# Patient Record
Sex: Female | Born: 1970 | Race: Black or African American | Hispanic: No | Marital: Single | Smoking: Never smoker
Health system: Southern US, Community
[De-identification: ages and names within clinical notes are randomized; demographics above are authoritative.]

## PROBLEM LIST (undated history)

## (undated) DIAGNOSIS — Z87898 Personal history of other specified conditions: Secondary | ICD-10-CM

## (undated) DIAGNOSIS — E785 Hyperlipidemia, unspecified: Secondary | ICD-10-CM

## (undated) DIAGNOSIS — M1990 Unspecified osteoarthritis, unspecified site: Secondary | ICD-10-CM

## (undated) DIAGNOSIS — K219 Gastro-esophageal reflux disease without esophagitis: Secondary | ICD-10-CM

## (undated) DIAGNOSIS — M2010 Hallux valgus (acquired), unspecified foot: Secondary | ICD-10-CM

## (undated) DIAGNOSIS — R7303 Prediabetes: Secondary | ICD-10-CM

## (undated) DIAGNOSIS — J45909 Unspecified asthma, uncomplicated: Secondary | ICD-10-CM

## (undated) DIAGNOSIS — T7840XA Allergy, unspecified, initial encounter: Secondary | ICD-10-CM

## (undated) HISTORY — DX: Personal history of other specified conditions: Z87.898

## (undated) HISTORY — DX: Prediabetes: R73.03

## (undated) HISTORY — DX: Hallux valgus (acquired), unspecified foot: M20.10

## (undated) HISTORY — DX: Unspecified osteoarthritis, unspecified site: M19.90

## (undated) HISTORY — DX: Allergy, unspecified, initial encounter: T78.40XA

## (undated) HISTORY — DX: Unspecified asthma, uncomplicated: J45.909

## (undated) HISTORY — DX: Gastro-esophageal reflux disease without esophagitis: K21.9

## (undated) HISTORY — DX: Hyperlipidemia, unspecified: E78.5

## (undated) HISTORY — PX: BUNIONECTOMY: SHX129

---

## 1999-05-22 ENCOUNTER — Emergency Department (HOSPITAL_COMMUNITY): Admission: EM | Admit: 1999-05-22 | Discharge: 1999-05-22 | Payer: Self-pay | Admitting: Emergency Medicine

## 1999-05-22 ENCOUNTER — Encounter: Payer: Self-pay | Admitting: Emergency Medicine

## 2005-05-13 ENCOUNTER — Ambulatory Visit (HOSPITAL_COMMUNITY): Admission: RE | Admit: 2005-05-13 | Discharge: 2005-05-13 | Payer: Self-pay | Admitting: Gastroenterology

## 2005-05-25 ENCOUNTER — Ambulatory Visit (HOSPITAL_COMMUNITY): Admission: RE | Admit: 2005-05-25 | Discharge: 2005-05-25 | Payer: Self-pay | Admitting: Gastroenterology

## 2006-08-09 ENCOUNTER — Emergency Department (HOSPITAL_COMMUNITY): Admission: EM | Admit: 2006-08-09 | Discharge: 2006-08-09 | Payer: Self-pay | Admitting: Emergency Medicine

## 2006-09-13 ENCOUNTER — Emergency Department (HOSPITAL_COMMUNITY): Admission: EM | Admit: 2006-09-13 | Discharge: 2006-09-13 | Payer: Self-pay | Admitting: Emergency Medicine

## 2006-09-26 ENCOUNTER — Encounter: Admission: RE | Admit: 2006-09-26 | Discharge: 2006-09-26 | Payer: Self-pay | Admitting: Gastroenterology

## 2007-07-12 ENCOUNTER — Encounter: Admission: RE | Admit: 2007-07-12 | Discharge: 2007-07-12 | Payer: Self-pay | Admitting: Internal Medicine

## 2007-12-03 IMAGING — CR DG CHEST 2V
2 series · 2 of 2 positions shown · non-contrast
Comparison: [DATE].

CLINICAL DATA: Left sided chest pain for a week.  Short of breath.  Cough.  History of asthma.
 CHEST - 2 VIEW:

[w chest pa]
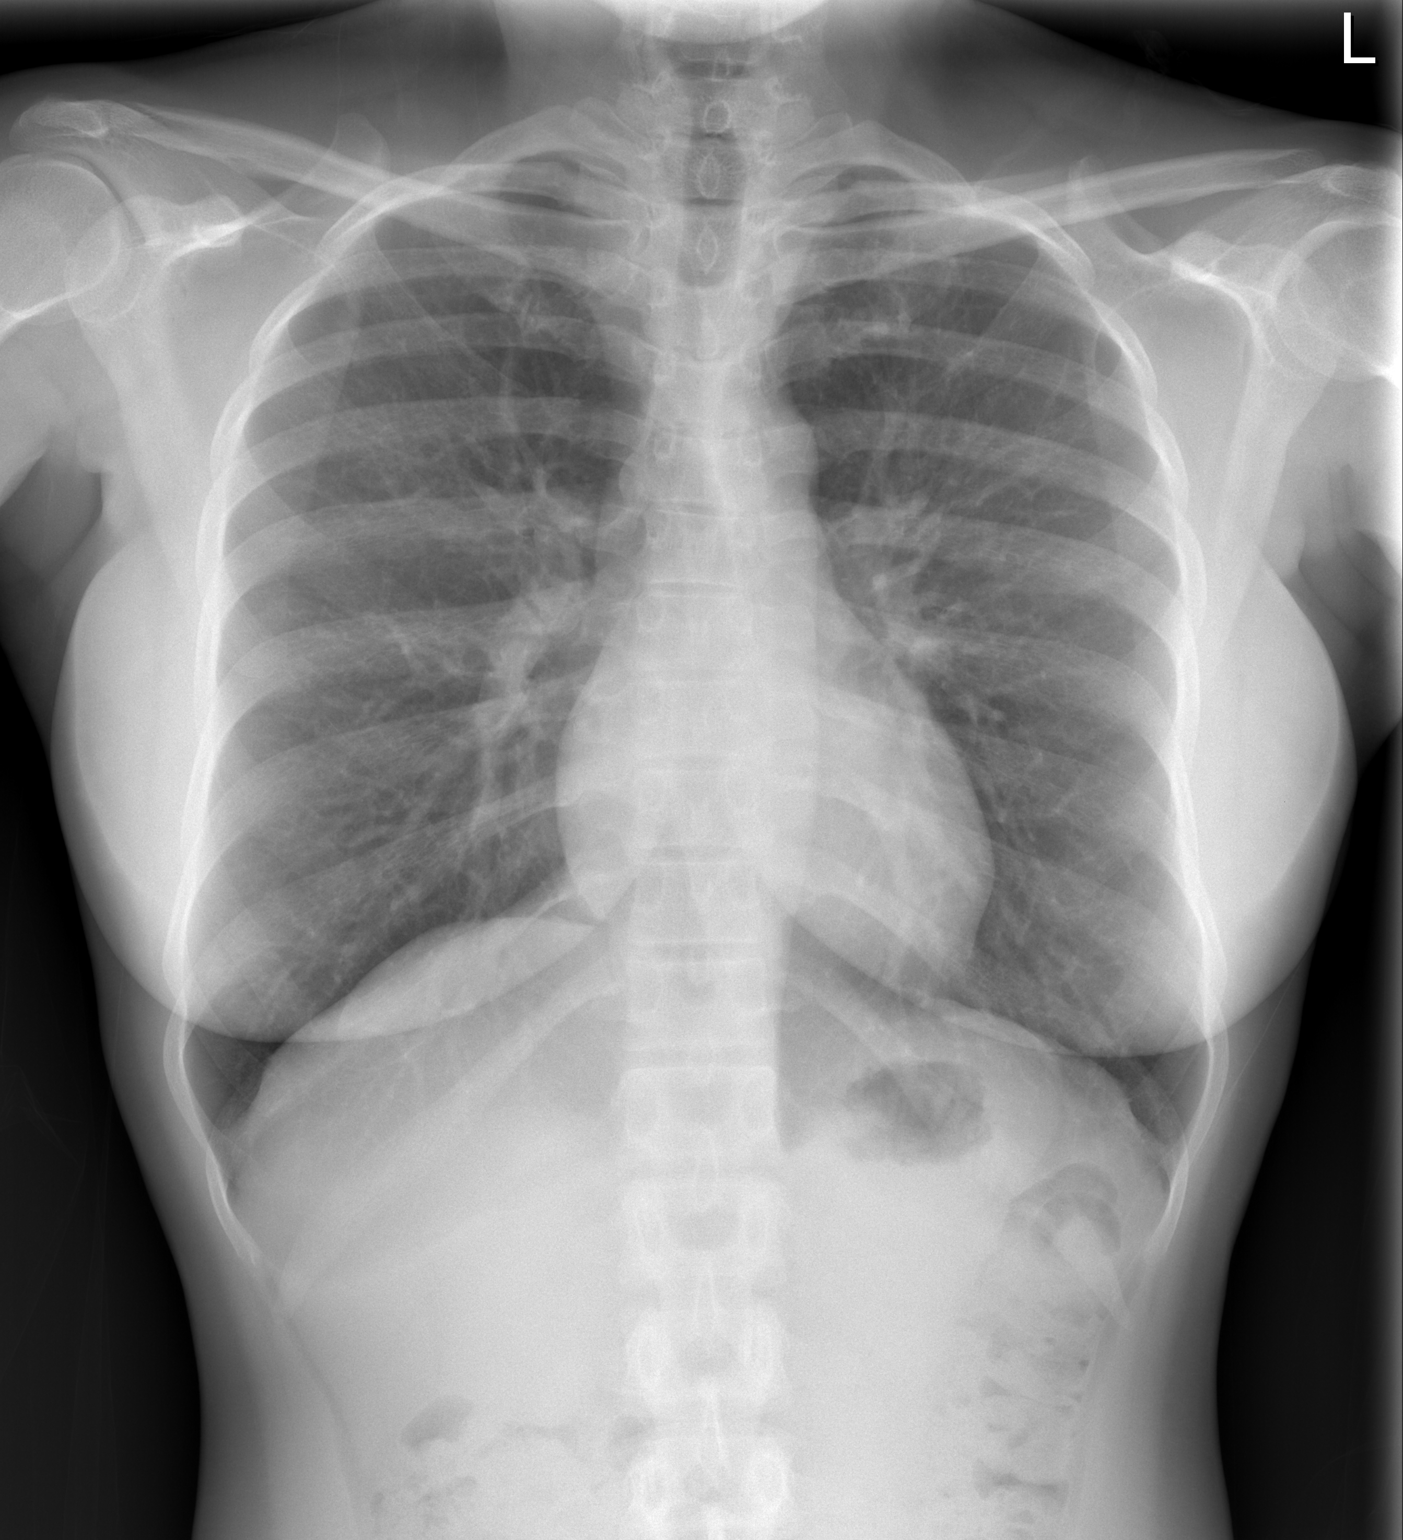

[w chest lat]
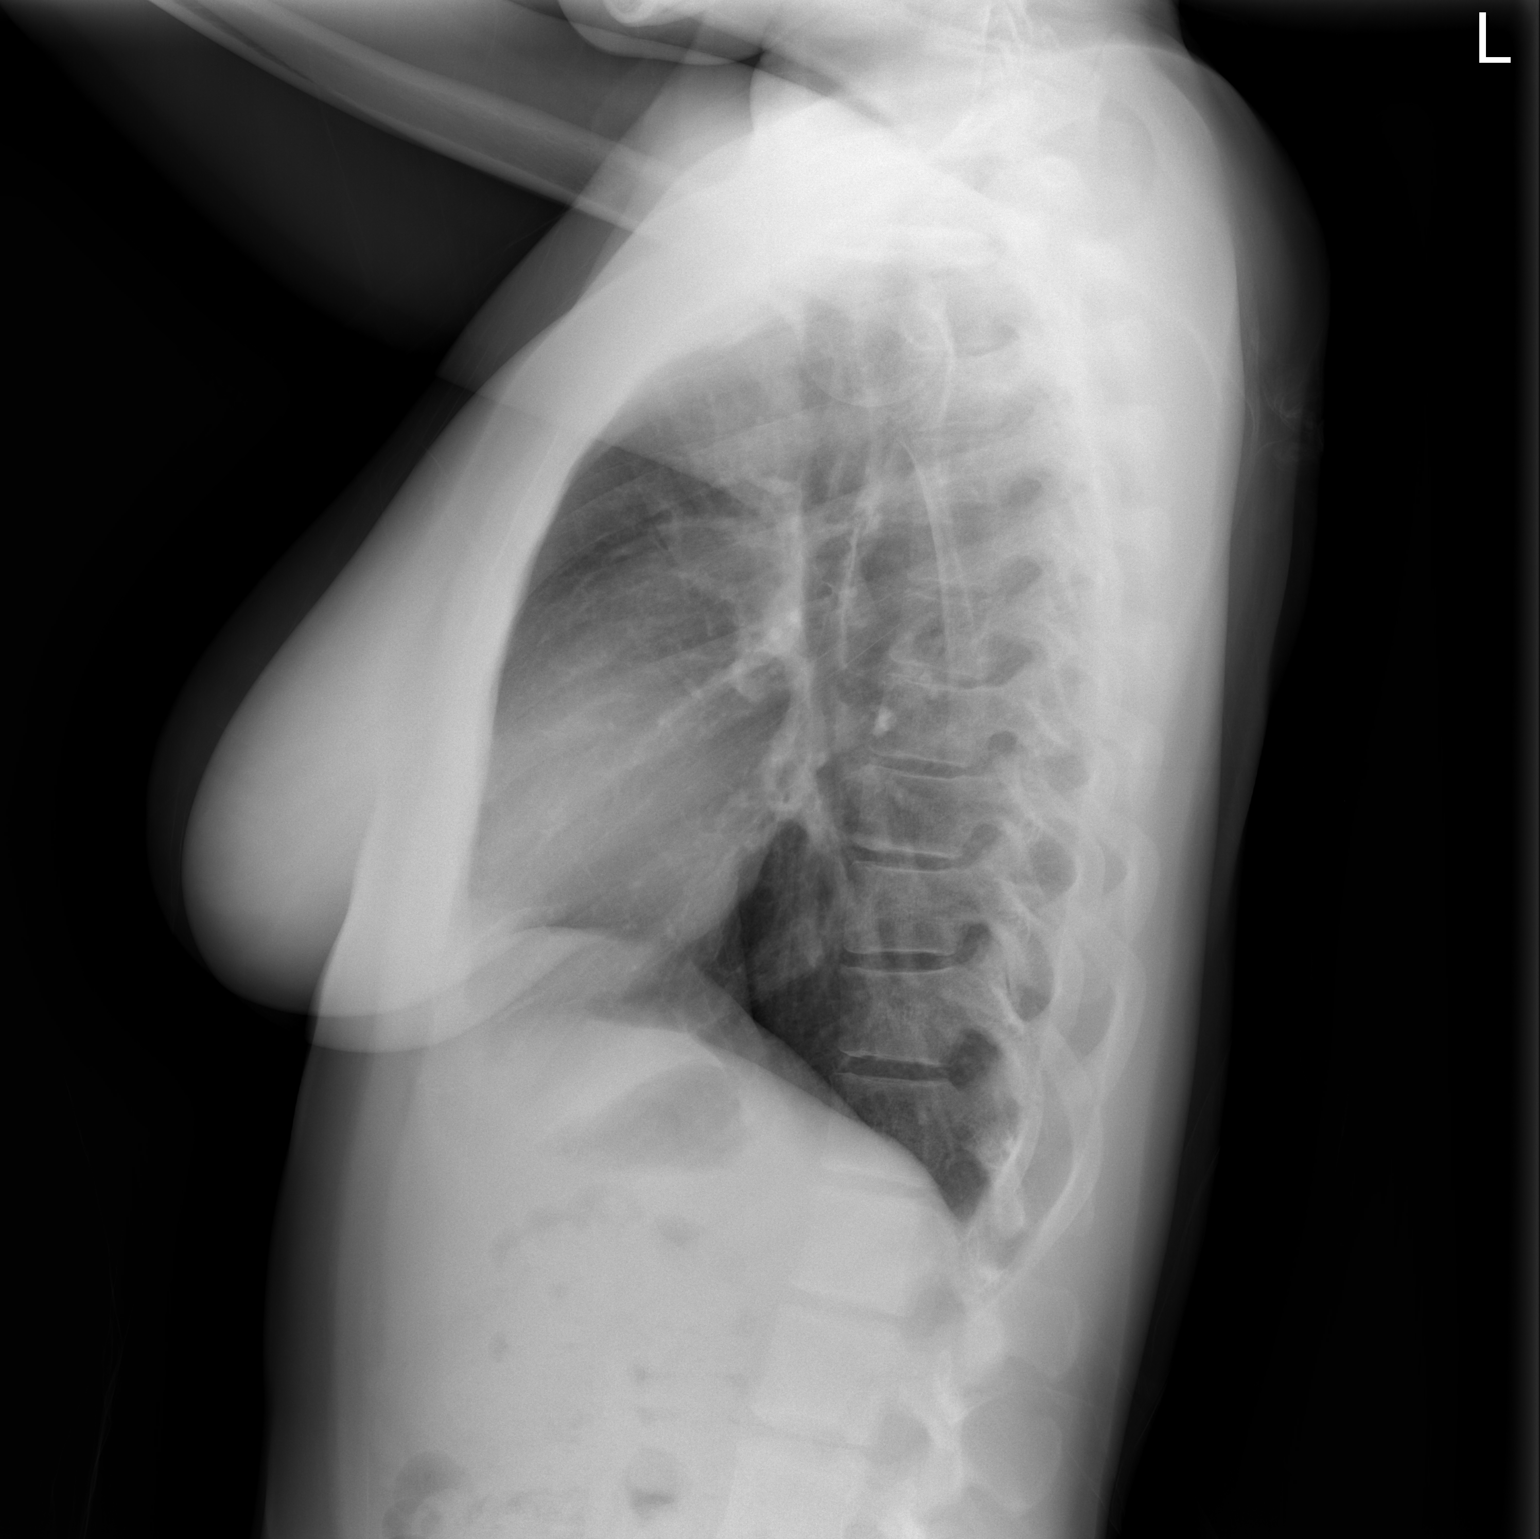

[2 of 2 positions shown; findings below may reference images not displayed]

FINDINGS: Heart size is normal.  The mediastinum is unremarkable.  The lungs are clear.  No effusions.  No soft tissue or bony abnormality is seen.
IMPRESSION: Normal chest.

## 2008-01-25 ENCOUNTER — Encounter: Admission: RE | Admit: 2008-01-25 | Discharge: 2008-01-25 | Payer: Self-pay | Admitting: Cardiology

## 2008-01-31 ENCOUNTER — Ambulatory Visit (HOSPITAL_COMMUNITY): Admission: RE | Admit: 2008-01-31 | Discharge: 2008-01-31 | Payer: Self-pay | Admitting: Cardiology

## 2010-01-26 ENCOUNTER — Encounter: Payer: Self-pay | Admitting: Gastroenterology

## 2010-05-12 ENCOUNTER — Emergency Department (HOSPITAL_COMMUNITY): Payer: BC Managed Care – PPO

## 2010-05-12 ENCOUNTER — Emergency Department (HOSPITAL_COMMUNITY)
Admission: EM | Admit: 2010-05-12 | Discharge: 2010-05-12 | Disposition: A | Discharge status: Home / Self Care | Payer: BC Managed Care – PPO | Attending: Emergency Medicine | Admitting: Emergency Medicine

## 2010-05-12 DIAGNOSIS — R42 Dizziness and giddiness: Secondary | ICD-10-CM | POA: Insufficient documentation

## 2010-05-12 DIAGNOSIS — K219 Gastro-esophageal reflux disease without esophagitis: Secondary | ICD-10-CM | POA: Insufficient documentation

## 2010-05-12 DIAGNOSIS — D259 Leiomyoma of uterus, unspecified: Secondary | ICD-10-CM | POA: Insufficient documentation

## 2010-05-12 DIAGNOSIS — N949 Unspecified condition associated with female genital organs and menstrual cycle: Secondary | ICD-10-CM | POA: Insufficient documentation

## 2010-05-12 DIAGNOSIS — R1032 Left lower quadrant pain: Secondary | ICD-10-CM | POA: Insufficient documentation

## 2010-05-12 LAB — DIFFERENTIAL
Basophils Absolute: 0 10*3/uL (ref 0.0–0.1)
Lymphocytes Relative: 19 % (ref 12–46)
Monocytes Absolute: 0.7 10*3/uL (ref 0.1–1.0)
Monocytes Relative: 12 % (ref 3–12)
Neutro Abs: 4.1 10*3/uL (ref 1.7–7.7)

## 2010-05-12 LAB — CBC
HCT: 38.3 % (ref 36.0–46.0)
Hemoglobin: 12.4 g/dL (ref 12.0–15.0)
MCH: 26.8 pg (ref 26.0–34.0)
MCHC: 32.4 g/dL (ref 30.0–36.0)
RBC: 4.63 MIL/uL (ref 3.87–5.11)

## 2010-05-12 LAB — URINALYSIS, ROUTINE W REFLEX MICROSCOPIC
Glucose, UA: NEGATIVE mg/dL
Hgb urine dipstick: NEGATIVE
Specific Gravity, Urine: 1.038 — ABNORMAL HIGH (ref 1.005–1.030)
pH: 6 (ref 5.0–8.0)

## 2010-05-12 LAB — WET PREP, GENITAL: Trich, Wet Prep: NONE SEEN

## 2010-05-13 LAB — GC/CHLAMYDIA PROBE AMP, GENITAL
Chlamydia, DNA Probe: NEGATIVE
GC Probe Amp, Genital: NEGATIVE

## 2010-05-19 NOTE — Consult Note (Signed)
Ruth [name], Ruth [name]            ACCOUNT NO.:  [id number]   MEDICAL RECORD NO.:  [id number]          PATIENT TYPE:  OIB   LOCATION:  2899                         FACILITY:  [facility]   PHYSICIAN:  Ritta Slot, MD     DATE OF BIRTH:  [birth year]   DATE OF CONSULTATION:  DATE OF DISCHARGE:  01/31/2008                                 CONSULTATION   This is a tilt table study.   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  We therefore felt that  she had a diagnosis of neurocardiogenic syncope.  She is brought to  [facility] Cardiac Cath Lab for further evaluation with a tilt table  study.   DESCRIPTION OF PROCEDURE:  After informed consent, the patient was  brought to the [facility] Second Floor Cardiac Cath Lab where she was  placed in the supine position for 5 minutes.  Her resting heart rate was  68 with a blood pressure of 115/75.  She was tilted at 70 degrees for a  total of 21 minutes.  At 19 minutes, her heart rate increased to 120  with a blood pressure of 90/60 and she became symptomatic with symptoms  of dizziness, fainting, and significant loss of consciousness.  At 21  minutes, her heart rate dropped down to a heart rate of 30 and her blood  pressure was not recordable.  She was laid down in the supine position  immediately and her heart rate did not pick up after 2 minutes and was  maintained at 30 beats per minute.  She was therefore given atropine 0.5  mg IV, by minute 24, her heart rate came back up to the 60s with a blood  pressure of 121/38.  She is now responsive, and she remained well.  This  was able to mimic the exact symptoms that she has had in the past.   IMPRESSION:  Positive tilt-table study test.  Vaso depressive syncope.   PLAN:  We will start her on metoprolol 25 mg p.o. b.i.d.Marland Kitchen  I will keep  her well hydrated and  encouraged her to have excessive salt intake.  She  will follow up with me in 2 weeks in office for further evaluation.      Ritta Slot, MD  Electronically Signed     HS/MEDQ  D:  01/31/2008  T:  02/01/2008  Job:  [id number]

## 2010-10-16 LAB — DIFFERENTIAL
Basophils Absolute: 0
Eosinophils Relative: 5
Lymphocytes Relative: 42
Lymphs Abs: 1.8
Monocytes Absolute: 0.5
Monocytes Relative: 12 — ABNORMAL HIGH
Neutro Abs: 1.8

## 2010-10-16 LAB — CBC
HCT: 33.8 — ABNORMAL LOW
MCHC: 33.5
MCV: 83.3
Platelets: 300
RDW: 12.9
WBC: 4.3

## 2010-10-16 LAB — COMPREHENSIVE METABOLIC PANEL
AST: 19
Albumin: 4.1
BUN: 7
Calcium: 9.3
Chloride: 106
Creatinine, Ser: 0.85
GFR calc Af Amer: >60
Total Protein: 7

## 2010-10-16 LAB — URINALYSIS, ROUTINE W REFLEX MICROSCOPIC
Hgb urine dipstick: NEGATIVE
Ketones, ur: NEGATIVE
Protein, ur: NEGATIVE
Urobilinogen, UA: 0.2

## 2010-10-19 LAB — BASIC METABOLIC PANEL
CO2: 24
Calcium: 9.4
GFR calc Af Amer: >60
Glucose, Bld: 111 — ABNORMAL HIGH
Potassium: 3.6
Sodium: 137

## 2010-10-19 LAB — DIFFERENTIAL
Eosinophils Relative: 2
Lymphocytes Relative: 22
Monocytes Absolute: 0.6
Monocytes Relative: 10
Neutro Abs: 4.3

## 2010-10-19 LAB — CBC
HCT: 36.8
Hemoglobin: 12.2
MCHC: 33.3
RBC: 4.5
RDW: 12.8

## 2010-10-19 LAB — POCT CARDIAC MARKERS
CKMB, poc: <1 — ABNORMAL LOW
Myoglobin, poc: 33.8

## 2013-05-18 ENCOUNTER — Ambulatory Visit (INDEPENDENT_AMBULATORY_CARE_PROVIDER_SITE_OTHER): Payer: BC Managed Care – PPO | Admitting: Internal Medicine

## 2013-05-18 VITALS — BP 110/70 | HR 71 | Temp 98.1°F | Resp 18 | Ht 64.0 in | Wt 149.0 lb

## 2013-05-18 DIAGNOSIS — Z111 Encounter for screening for respiratory tuberculosis: Secondary | ICD-10-CM

## 2013-05-18 DIAGNOSIS — E559 Vitamin D deficiency, unspecified: Secondary | ICD-10-CM

## 2013-05-18 DIAGNOSIS — R5383 Other fatigue: Secondary | ICD-10-CM

## 2013-05-18 DIAGNOSIS — Z0000 Encounter for general adult medical examination without abnormal findings: Secondary | ICD-10-CM

## 2013-05-18 DIAGNOSIS — R5381 Other malaise: Secondary | ICD-10-CM

## 2013-05-18 LAB — POCT CBC
Granulocyte percent: 62.8 %G (ref 37–80)
HCT, POC: 39.9 % (ref 37.7–47.9)
Hemoglobin: 12.4 g/dL (ref 12.2–16.2)
Lymph, poc: 1.6 (ref 0.6–3.4)
MCH, POC: 26.7 pg — AB (ref 27–31.2)
MCHC: 31.1 g/dL — AB (ref 31.8–35.4)
MCV: 86 fL (ref 80–97)
MID (CBC): 0.4 (ref 0–0.9)
MPV: 9.8 fL (ref 0–99.8)
PLATELET COUNT, POC: 320 10*3/uL (ref 142–424)
POC Granulocyte: 3.5 (ref 2–6.9)
POC LYMPH %: 29.4 % (ref 10–50)
POC MID %: 7.8 %M (ref 0–12)
RBC: 4.64 M/uL (ref 4.04–5.48)
RDW, POC: 14 %
WBC: 5.5 10*3/uL (ref 4.6–10.2)

## 2013-05-18 NOTE — Progress Notes (Addendum)
Subjective:    Patient ID: Ruth [name], female    DOB: Dec 08, 1970, 43 y.o.   MRN: [id number] This chart was scribed for Tami Lin, MD by Rolanda Lundborg, ED Scribe. This patient was seen in room 12 and the patient's care was started at 6:23 PM.  Chief Complaint  Patient presents with  . Employment Physical    [employer]     Tuberculosis Risk Questionnaire  1. No Were you born outside the [place] in one of the following parts of the world: [place], [place], [place], [place] or [place]?    2. no Have you traveled outside the [place] and lived for more than one month in one of the following parts of the world: [place], [place], [place], [place] or [place]?    3. No  Do you have a compromised immune system such as from any of the following conditions:HIV/AIDS, organ or bone marrow transplantation, diabetes, immunosuppressive medicines (e.g. Prednisone, Remicaide), leukemia, lymphoma, cancer of the head or neck, gastrectomy or jejunal bypass, end-stage renal disease (on dialysis), or silicosis?     4. No Have you ever or do you plan on working in: a residential care center, a health care facility, a jail or prison or homeless shelter?    5.no Have you ever: injected illegal drugs, used crack cocaine, lived in a homeless shelter  or been in jail or prison?     6. No Have you ever been exposed to anyone with infectious tuberculosis?    Tuberculosis Symptom Questionnaire  Do you currently have any of the following symptoms?  1. No Unexplained cough lasting more than 3 weeks?   2. No Unexplained fever lasting more than 3 weeks.   3. No Night Sweats (sweating that leaves the bedclothes and sheets wet)     4. No  Shortness of Breath   5. No  Chest Pain   6. No  Unintentional weight loss    7. No  Unexplained fatigue (very tired for no reason)      HPI HPI Comments: Ruth [name] is a 43 y.o. 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 She reports right ear pain described as a heavy sensation recently that she thinks is related to spring-time pollen. She also reports itching in the ear. She is not on any medications. She does not smoke or drink. Her mom has mild HTN. She states her cholesterol was fine last time it was checked 3-4 years ago. She states she has always been borderline anemic but asymptomatic and not on any medications. She reports low Vitamin D historically but asymptomatic. Pt is requesting to have Vitamin D level checked(Hx Marked deficiency). She states she would like her hormones checked as well because she has been feeling fatigued and thinks she may be premenopausal. She denies palpitations. She denies lower back problems. Last pap smear was last year. She has not had a TB skin test since 3 years ago. Last TDAP was 3 years ago.   PCP - PROVIDER NOT IN SYSTEM Has yearly gyn w/ pap br exam///not at risk for preg There are no active problems to display for this patient.  No current outpatient prescriptions on file prior to visit.   No current facility-administered medications on file prior to visit.   Active/athletic/coaches  Review of Systems  Constitutional: Negative for fever, chills, activity change, appetite change and unexpected weight change.  HENT: Negative for hearing loss and trouble swallowing.   Eyes: Negative for visual disturbance.  Respiratory: Negative for cough and shortness of breath.   Cardiovascular: Negative for chest pain, palpitations and leg swelling.  Gastrointestinal: Negative for nausea, abdominal pain, diarrhea and constipation.  Genitourinary: Negative for frequency, difficulty urinating and menstrual problem.  Musculoskeletal: Negative for back pain and gait problem.  Neurological: Negative for dizziness and headaches.  Hematological: Negative for adenopathy. Does not bruise/bleed easily.    Psychiatric/Behavioral: Negative for behavioral problems, sleep disturbance, dysphoric mood and decreased concentration.       Objective:   Physical Exam  Nursing note and vitals reviewed. Constitutional: She is oriented to person, place, and time. She appears well-developed and well-nourished. No distress.  HENT:  Head: Normocephalic and atraumatic.  Right Ear: External ear normal.  Left Ear: External ear normal.  Nose: Nose normal.  Mouth/Throat: Oropharynx is clear and moist.  Eyes: Conjunctivae and EOM are normal. Pupils are equal, round, and reactive to light.  Neck: Normal range of motion. Neck supple. No thyromegaly present.  Cardiovascular: Normal rate, regular rhythm, normal heart sounds and intact distal pulses.   No murmur heard. Pulmonary/Chest: Effort normal and breath sounds normal. No respiratory distress.  Abdominal: Soft. Bowel sounds are normal. She exhibits no distension and no mass. There is no hepatosplenomegaly, splenomegaly or hepatomegaly. There is no tenderness. There is no rebound.  Musculoskeletal: Normal range of motion. She exhibits no edema and no tenderness.  Lymphadenopathy:    She has no cervical adenopathy.  Neurological: She is alert and oriented to person, place, and time. She has normal reflexes. No cranial nerve deficit.  Skin: Skin is warm and dry.  Psychiatric: She has a normal mood and affect. Her behavior is normal. Judgment and thought content normal.     Filed Vitals:   05/18/13 1800  BP: 110/70  Pulse: 71  Temp: 98.1 F (36.7 C)  TempSrc: Oral  Resp: 18  Height: 5\' 4"  (1.626 m)  Weight: 149 lb (67.586 kg)  SpO2: 100%        Assessment & Plan:    I have completed the patient encounter in its entirety as documented by the scribe, with editing by me where necessary. Josseline Reddin P. Laney Pastor, M.D. Routine general medical examination at a health care facility - Plan: POCT CBC, Comprehensive metabolic panel, TSH, Vit D  25 hydroxy  (rtn osteoporosis monitoring), TB Skin Test  Unspecified vitamin D deficiency - Plan: Vit D  25 hydroxy (rtn osteoporosis monitoring)  Fatigue - Plan: POCT CBC  Tb test f/u 48-72h

## 2013-05-19 LAB — TSH: TSH: 0.545 u[IU]/mL (ref 0.350–4.500)

## 2013-05-20 LAB — COMPREHENSIVE METABOLIC PANEL
ALT: 10 U/L (ref 0–35)
AST: 16 U/L (ref 0–37)
Albumin: 4.5 g/dL (ref 3.5–5.2)
Alkaline Phosphatase: 67 U/L (ref 39–117)
BUN: 15 mg/dL (ref 6–23)
CALCIUM: 9.5 mg/dL (ref 8.4–10.5)
CHLORIDE: 99 meq/L (ref 96–112)
CO2: 26 mEq/L (ref 19–32)
CREATININE: 0.97 mg/dL (ref 0.50–1.10)
Glucose, Bld: 94 mg/dL (ref 70–99)
Potassium: 3.6 mEq/L (ref 3.5–5.3)
Sodium: 135 mEq/L (ref 135–145)
Total Bilirubin: 0.4 mg/dL (ref 0.2–1.2)
Total Protein: 7.5 g/dL (ref 6.0–8.3)

## 2013-05-21 ENCOUNTER — Ambulatory Visit (INDEPENDENT_AMBULATORY_CARE_PROVIDER_SITE_OTHER): Payer: BC Managed Care – PPO | Admitting: *Deleted

## 2013-05-21 DIAGNOSIS — Z111 Encounter for screening for respiratory tuberculosis: Secondary | ICD-10-CM

## 2013-05-21 LAB — TB SKIN TEST
INDURATION: 0 mm
TB Skin Test: NEGATIVE

## 2013-05-21 LAB — VITAMIN D 25 HYDROXY (VIT D DEFICIENCY, FRACTURES): Vit D, 25-Hydroxy: 26 ng/mL — ABNORMAL LOW (ref 30–89)

## 2013-05-21 NOTE — Progress Notes (Signed)
   Subjective:    Patient ID: Ruth [name], female    DOB: 05/10/1970, 43 y.o.   MRN: [id number]  HPI  Pt here for TB skin reading only.  Review of Systems     Objective:   Physical Exam        Assessment & Plan:

## 2013-05-23 ENCOUNTER — Encounter: Payer: Self-pay | Admitting: Internal Medicine

## 2013-05-30 ENCOUNTER — Telehealth: Payer: Self-pay | Admitting: *Deleted

## 2013-05-30 NOTE — Telephone Encounter (Signed)
Pt calling back concerning lab results. Discussed results with her; she expressed understanding and understands to start OTC Vit D 1000IU.

## 2014-05-07 ENCOUNTER — Ambulatory Visit: Payer: BC Managed Care – PPO | Admitting: Nurse Practitioner

## 2014-05-10 ENCOUNTER — Encounter: Payer: Self-pay | Admitting: Nurse Practitioner

## 2014-05-10 ENCOUNTER — Ambulatory Visit (INDEPENDENT_AMBULATORY_CARE_PROVIDER_SITE_OTHER): Payer: BC Managed Care – PPO | Admitting: Nurse Practitioner

## 2014-05-10 VITALS — BP 114/70 | HR 70 | Resp 14 | Ht 64.5 in | Wt 156.0 lb

## 2014-05-10 DIAGNOSIS — M79676 Pain in unspecified toe(s): Secondary | ICD-10-CM | POA: Insufficient documentation

## 2014-05-10 DIAGNOSIS — M79674 Pain in right toe(s): Secondary | ICD-10-CM

## 2014-05-10 DIAGNOSIS — Z0000 Encounter for general adult medical examination without abnormal findings: Secondary | ICD-10-CM | POA: Diagnosis not present

## 2014-05-10 DIAGNOSIS — M546 Pain in thoracic spine: Secondary | ICD-10-CM | POA: Insufficient documentation

## 2014-05-10 DIAGNOSIS — M25532 Pain in left wrist: Secondary | ICD-10-CM | POA: Diagnosis not present

## 2014-05-10 DIAGNOSIS — E559 Vitamin D deficiency, unspecified: Secondary | ICD-10-CM

## 2014-05-10 NOTE — Patient Instructions (Signed)
For arthritis, you may use topical aspercreme on painful joints 3 times daily. Avoid advil & ibuprophen as they can cause stomach irritation.  Natural anti-inflammatories can be used as well: Fresh ginger tea daily (grate 1-2 TBLS fresh ginger in & steep in hot water for 5 minutes); handful tart cherries or 1/2 cup tart cherry juice daily; 2-3 curmarin capsules daily. Start 1 supplement at a time & take for 2 weeks before adding a second or third, if needed. Please see othopedist for foot pain. Please return for fasting labs. My office will call with lab results. Please return in 8 weeks to evaluate joint pain.  For lowest risk of chronic disease, you need to shed about 10 pounds. Cut out refined sugar: anything that is sweet when you eat or drink it, except fresh fruit. Cut out refined grains: white bread, rolls, biscuits, bagels, muffins, pasta and cereals. Choose grains with 4 gm or more of fiber per serving.  Increase activity to 30 minutes daily Avoid fast food!  Very nice to meet you!  Osteoarthritis Osteoarthritis is a disease that causes soreness and swelling (inflammation) of a joint. It occurs when the cartilage at the affected joint wears down. Cartilage acts as a cushion, covering the ends of bones where they meet to form a joint. Osteoarthritis is the most common form of arthritis. It often occurs in older people. The joints affected most often by this condition include those in the:  Ends of the fingers.  Thumbs.  Neck.  Lower back.  Knees.  Hips. CAUSES  Over time, the cartilage that covers the ends of bones begins to wear away. This causes bone to rub on bone, producing pain and stiffness in the affected joints.  RISK FACTORS Certain factors can increase your chances of having osteoarthritis, including:  Older age.  Excessive body weight.  Overuse of joints. SIGNS AND SYMPTOMS   Pain, swelling, and stiffness in the joint.  Over time, the joint may lose its  normal shape.  Small deposits of bone (osteophytes) may grow on the edges of the joint.  Bits of bone or cartilage can break off and float inside the joint space. This may cause more pain and damage. DIAGNOSIS  Your health care provider will do a physical exam and ask about your symptoms. Various tests may be ordered, such as:  X-rays of the affected joint.  An MRI scan.  Blood tests to rule out other types of arthritis.  Joint fluid tests. This involves using a needle to draw fluid from the joint and examining the fluid under a microscope. TREATMENT  Goals of treatment are to control pain and improve joint function. Treatment plans may include:  A prescribed exercise program that allows for rest and joint relief.  A weight control plan.  Pain relief techniques, such as:  Properly applied heat and cold.  Electric pulses delivered to nerve endings under the skin (transcutaneous electrical nerve stimulation, TENS).  Massage.  Certain nutritional supplements.  Medicines to control pain, such as:  Acetaminophen.  Nonsteroidal anti-inflammatory drugs (NSAIDs), such as naproxen.  Narcotic or central-acting agents, such as tramadol.  Corticosteroids. These can be given orally or as an injection.  Surgery to reposition the bones and relieve pain (osteotomy) or to remove loose pieces of bone and cartilage. Joint replacement may be needed in advanced states of osteoarthritis. HOME CARE INSTRUCTIONS   Only take over-the-counter or prescription medicines as directed by your health care provider. Take all medicines exactly as instructed.  Maintain a healthy weight. Follow your health care provider's instructions for weight control. This may include dietary instructions.  Exercise as directed. Your health care provider can recommend specific types of exercise. These may include:  Strengthening exercises These are done to strengthen the muscles that support joints affected by  arthritis. They can be performed with weights or with exercise bands to add resistance.  Aerobic activities These are exercises, such as brisk walking or low-impact aerobics, that get your heart pumping.  Range-of-motion activities These keep your joints limber.  Balance and agility exercises These help you maintain daily living skills.  Rest your affected joints as directed by your health care provider.  Follow up with your health care provider as directed. SEEK MEDICAL CARE IF:   Your skin turns red.  You develop a rash in addition to your joint pain.  You have worsening joint pain. SEEK IMMEDIATE MEDICAL CARE IF:  You have a significant loss of weight or appetite.  You have a fever along with joint or muscle aches.  You have night sweats. [place] of Arthritis and Musculoskeletal and Skin Diseases: www.niams.SouthExposed.es [employer] on Aging: http://kim-miller.com/ American College of Rheumatology: www.rheumatology.org Document Released: 12/21/2004 Document Revised: 10/11/2012 Document Reviewed: 08/28/2012 [facility] Patient Information 2014 [place], [place].

## 2014-05-10 NOTE — Progress Notes (Signed)
Subjective:     Ruth [name] is a 44 y.o. female and is here to establish care. The patient reports problems - intermittent L great toe pain, L wrist pain, R back pain. R knee pain. She played college basketball, currently coashes basketball at HS. Hx R knee pain w/steroid inj 2014 w/relief, has occasional swelling. Hx vit d def. intermittent L great toe pain: Hx hallux valgus w/repair & hardware as child. Pain onset 5 mos. Intermittent, not aggravated by weight bearing. Ibuprophen does not help. Tylenol helps. Pain is mild to moderate. L wrist pain: onset few weeks ago at ulnar styloid, sharp shooting & intermittent. Not r/t activity.   R back pain: few weeks, no am stiffness, no radiation. Mild pain. changes w/position changes. Prev care reviewed: see gyn for wom[facility]. Pap 3/16 nml. MMG 04/2013.  History   Social History  . Marital Status: Single    Spouse Name: N/A  . Number of Children: 0  . Years of Education: N/A   Occupational History  . teacher     HS, basketball coach   Social History Main Topics  . Smoking status: Never Smoker   . Smokeless tobacco: Not on file  . Alcohol Use: Yes     Comment: occasional  . Drug Use: No  . Sexual Activity: Not on file   Other Topics Concern  . Not on file   Social History Narrative   [name] works FT as Pharmacist, hospital. She lives alone.    Health Maintenance  Topic Date Due  . HIV Screening  07/18/1984  . TETANUS/TDAP  07/18/1988  . INFLUENZA VACCINE  08/05/2014  . PAP SMEAR  04/09/2017    The following portions of the patient's history were reviewed and updated as appropriate: allergies, current medications, past family history, past medical history, past social history, past surgical history and problem list.  Review of Systems Constitutional: negative for fatigue and fevers Eyes: positive for contacts/glasses Ears, nose, mouth, throat, and face: positive for R ear stuffy, negative for nasal congestion Respiratory: negative  for cough Cardiovascular: negative for chest pressure/discomfort, irregular heart beat and lower extremity edema Gastrointestinal: negative for abdominal pain, change in bowel habits, constipation, diarrhea and dyspepsia Genitourinary:negative for abnormal menstrual periods Integument/breast: negative for breast lump, breast tenderness, rash and skin lesion(s)   Objective:    BP 114/70 mmHg  Pulse 70  Resp 14  Ht 5' 4.5" (1.638 m)  Wt 156 lb (70.761 kg)  BMI 26.37 kg/m2 General appearance: alert, cooperative, appears stated age and no distress Head: Normocephalic, without obvious abnormality, atraumatic Eyes: negative findings: lids and lashes normal, conjunctivae and sclerae normal, corneas clear and pupils equal, round, reactive to light and accomodation Ears: normal TM's and external ear canals both ears Throat: lips, mucosa, and tongue normal; teeth and gums normal Neck: no adenopathy, no carotid bruit, supple, symmetrical, trachea midline and thyroid not enlarged, symmetric, no tenderness/mass/nodules Back: symmetric, no curvature. ROM normal. No CVA tenderness. Lungs: clear to auscultation bilaterally Heart: regular rate and rhythm, S1, S2 normal, no murmur, click, rub or gallop Abdomen: normal findings: no masses palpable and no organomegaly and abnormal findings:  protrusive central abdomen below umbilicus. not hernia Extremities: extremities normal, atraumatic, no cyanosis or edema and R knee slightly larger than L, no warmth or effusion, FROM, no crepitus. L great toe has slight valgus deformity, well healed scar lateral great toe, tender over MTP joint. Neg squeeze test. L wrist nml, ROM nml. Pulses: 2+ and symmetric Neurologic: Grossly  normal    Assessment:Plan   1. Great toe pain, right Hx hallux valgus repair as child, has pin in toe. - Ambulatory referral to Orthopedics  2. Left wrist pain Likely OA, will monitor  3. Right-sided thoracic back pain  4.  Preventative health care Encourage 10 lb wt loss for best health. Discussed diet & exercise strategies - CBC with Differential/Platelet; Future - Comprehensive metabolic panel; Future - Lipid panel; Future - HIV antibody; Future - Hemoglobin A1c; Future - T4, free; Future - TSH; Future - Vit D  25 hydroxy (rtn osteoporosis monitoring); Future  5. Vitamin D deficiency Taking 1000 iu 2/wk - Vit D  25 hydroxy (rtn osteoporosis monitoring); Future

## 2014-05-15 ENCOUNTER — Other Ambulatory Visit (INDEPENDENT_AMBULATORY_CARE_PROVIDER_SITE_OTHER): Payer: BC Managed Care – PPO

## 2014-05-15 DIAGNOSIS — Z0000 Encounter for general adult medical examination without abnormal findings: Secondary | ICD-10-CM | POA: Diagnosis not present

## 2014-05-15 DIAGNOSIS — E559 Vitamin D deficiency, unspecified: Secondary | ICD-10-CM

## 2014-05-15 LAB — HEMOGLOBIN A1C: HEMOGLOBIN A1C: 5.7 % (ref 4.6–6.5)

## 2014-05-15 LAB — CBC WITH DIFFERENTIAL/PLATELET
BASOS ABS: 0 10*3/uL (ref 0.0–0.1)
BASOS PCT: 0.6 % (ref 0.0–3.0)
EOS ABS: 0.2 10*3/uL (ref 0.0–0.7)
Eosinophils Relative: 4.8 % (ref 0.0–5.0)
HCT: 36.3 % (ref 36.0–46.0)
Hemoglobin: 12 g/dL (ref 12.0–15.0)
LYMPHS ABS: 1.4 10*3/uL (ref 0.7–4.0)
LYMPHS PCT: 38.2 % (ref 12.0–46.0)
MCHC: 33.1 g/dL (ref 30.0–36.0)
MCV: 79.7 fl (ref 78.0–100.0)
Monocytes Absolute: 0.5 10*3/uL (ref 0.1–1.0)
Monocytes Relative: 14 % — ABNORMAL HIGH (ref 3.0–12.0)
Neutro Abs: 1.6 10*3/uL (ref 1.4–7.7)
Neutrophils Relative %: 42.4 % — ABNORMAL LOW (ref 43.0–77.0)
Platelets: 306 10*3/uL (ref 150.0–400.0)
RBC: 4.56 Mil/uL (ref 3.87–5.11)
RDW: 13.6 % (ref 11.5–15.5)
WBC: 3.7 10*3/uL — ABNORMAL LOW (ref 4.0–10.5)

## 2014-05-15 LAB — LIPID PANEL
CHOL/HDL RATIO: 4
Cholesterol: 265 mg/dL — ABNORMAL HIGH (ref 0–200)
HDL: 67.3 mg/dL (ref >39.00)
LDL CALC: 178 mg/dL — AB (ref 0–99)
NONHDL: 197.7
Triglycerides: 97 mg/dL (ref 0.0–149.0)
VLDL: 19.4 mg/dL (ref 0.0–40.0)

## 2014-05-15 LAB — COMPREHENSIVE METABOLIC PANEL
ALK PHOS: 65 U/L (ref 39–117)
ALT: 16 U/L (ref 0–35)
AST: 19 U/L (ref 0–37)
Albumin: 4.2 g/dL (ref 3.5–5.2)
BUN: 16 mg/dL (ref 6–23)
CO2: 28 mEq/L (ref 19–32)
CREATININE: 0.89 mg/dL (ref 0.40–1.20)
Calcium: 10 mg/dL (ref 8.4–10.5)
Chloride: 102 mEq/L (ref 96–112)
GFR: 88.28 mL/min (ref >60.00)
GLUCOSE: 86 mg/dL (ref 70–99)
Potassium: 4.5 mEq/L (ref 3.5–5.1)
Sodium: 137 mEq/L (ref 135–145)
Total Bilirubin: 0.4 mg/dL (ref 0.2–1.2)
Total Protein: 7.3 g/dL (ref 6.0–8.3)

## 2014-05-15 LAB — T4, FREE: Free T4: 0.75 ng/dL (ref 0.60–1.60)

## 2014-05-15 LAB — TSH: TSH: 0.79 u[IU]/mL (ref 0.35–4.50)

## 2014-05-15 LAB — VITAMIN D 25 HYDROXY (VIT D DEFICIENCY, FRACTURES): VITD: 17.94 ng/mL — AB (ref 30.00–100.00)

## 2014-05-16 ENCOUNTER — Telehealth: Payer: Self-pay | Admitting: Family Medicine

## 2014-05-16 LAB — HIV ANTIBODY (ROUTINE TESTING W REFLEX): HIV 1&2 Ab, 4th Generation: NONREACTIVE

## 2014-05-16 NOTE — Telephone Encounter (Signed)
Opened encounter in error  

## 2014-05-17 ENCOUNTER — Other Ambulatory Visit: Payer: Self-pay | Admitting: Nurse Practitioner

## 2014-05-17 DIAGNOSIS — E559 Vitamin D deficiency, unspecified: Secondary | ICD-10-CM

## 2014-05-17 MED ORDER — VITAMIN D3 1.25 MG (50000 UT) PO CAPS: 1.0000 | ORAL_CAPSULE | ORAL | Status: DC

## 2014-05-17 NOTE — Telephone Encounter (Signed)
LMOVM asking patient to call back!

## 2014-05-17 NOTE — Telephone Encounter (Signed)
Called and informed patient of results. She will CB to schedule appointment.

## 2014-05-17 NOTE — Telephone Encounter (Signed)
pls call pt: Advise Few concerns w/labs: white cells slightly low. These fight infection and do not necessarily mean anything is wrong, but I want to repeat the lab in 4-6 weeks. Vit D too low. Low vitamin D is associated with higher incidence of disease & poor bone health. Start prescription vitamin D. Take 1 capsule weekly for 12 weeks. Level will be checked again in 3 mos. (Please ask what pharm she wants to use). Script is pended. She is prediabetic. Diet & exercise changes we discussed will turn this around.  Pls schedule appt in 4 to 6 weeks.

## 2014-06-11 ENCOUNTER — Encounter: Payer: Self-pay | Admitting: Nurse Practitioner

## 2014-06-11 ENCOUNTER — Encounter: Payer: BC Managed Care – PPO | Admitting: Nurse Practitioner

## 2014-06-11 ENCOUNTER — Ambulatory Visit (INDEPENDENT_AMBULATORY_CARE_PROVIDER_SITE_OTHER): Payer: BC Managed Care – PPO | Admitting: Nurse Practitioner

## 2014-06-11 VITALS — BP 138/88 | HR 76 | Temp 98.0°F | Resp 18 | Ht 64.5 in | Wt 154.0 lb

## 2014-06-11 DIAGNOSIS — R232 Flushing: Secondary | ICD-10-CM | POA: Diagnosis not present

## 2014-06-11 DIAGNOSIS — R7309 Other abnormal glucose: Secondary | ICD-10-CM

## 2014-06-11 DIAGNOSIS — E559 Vitamin D deficiency, unspecified: Secondary | ICD-10-CM

## 2014-06-11 DIAGNOSIS — R7303 Prediabetes: Secondary | ICD-10-CM | POA: Insufficient documentation

## 2014-06-11 MED ORDER — ESCITALOPRAM OXALATE 10 MG PO TABS: 10.0000 mg | ORAL_TABLET | Freq: Every day | ORAL | Status: DC

## 2014-06-11 NOTE — Progress Notes (Signed)
Pre visit review using our clinic review tool, if applicable. No additional management support is needed unless otherwise documented below in the visit note. 

## 2014-06-11 NOTE — Patient Instructions (Signed)
Start lexapro for hot flashes. Take in evening. It may take 2 weeks to notice improvement. Great job with weight loss! Keep up good work with increased exercise & diet changes. See you in 2 weeks.  Perimenopause Perimenopause is the time when your body begins to move into the menopause (no menstrual period for 12 straight months). It is a natural process. Perimenopause can begin 2-8 years before the menopause and usually lasts for 1 year after the menopause. During this time, your ovaries may or may not produce an egg. The ovaries vary in their production of estrogen and progesterone hormones each month. This can cause irregular menstrual periods, difficulty getting pregnant, vaginal bleeding between periods, and uncomfortable symptoms. CAUSES  Irregular production of the ovarian hormones, estrogen and progesterone, and not ovulating every month.  Other causes include:  Tumor of the pituitary gland in the brain.  Medical disease that affects the ovaries.  Radiation treatment.  Chemotherapy.  Unknown causes.  Heavy smoking and excessive alcohol intake can bring on perimenopause sooner. SIGNS AND SYMPTOMS   Hot flashes.  Night sweats.  Irregular menstrual periods.  Decreased sex drive.  Vaginal dryness.  Headaches.  Mood swings.  Depression.  Memory problems.  Irritability.  Tiredness.  Weight gain.  Trouble getting pregnant.  The beginning of losing bone cells (osteoporosis).  The beginning of hardening of the arteries (atherosclerosis). DIAGNOSIS  Your health care provider will make a diagnosis by analyzing your age, menstrual history, and symptoms. He or she will do a physical exam and note any changes in your body, especially your female organs. Female hormone tests may or may not be helpful depending on the amount of female hormones you produce and when you produce them. However, other hormone tests may be helpful to rule out other problems. TREATMENT  In  some cases, no treatment is needed. The decision on whether treatment is necessary during the perimenopause should be made by you and your health care provider based on how the symptoms are affecting you and your lifestyle. Various treatments are available, such as:  Treating individual symptoms with a specific medicine for that symptom.  Herbal medicines that can help specific symptoms.  Counseling.  Group therapy. HOME CARE INSTRUCTIONS   Keep track of your menstrual periods (when they occur, how heavy they are, how long between periods, and how long they last) as well as your symptoms and when they started.  Only take over-the-counter or prescription medicines as directed by your health care provider.  Sleep and rest.  Exercise.  Eat a diet that contains calcium (good for your bones) and soy (acts like the estrogen hormone).  Do not smoke.  Avoid alcoholic beverages.  Take vitamin supplements as recommended by your health care provider. Taking vitamin E may help in certain cases.  Take calcium and vitamin D supplements to help prevent bone loss.  Group therapy is sometimes helpful.  Acupuncture may help in some cases. SEEK MEDICAL CARE IF:   You have questions about any symptoms you are having.  You need a referral to a specialist (gynecologist, psychiatrist, or psychologist). SEEK IMMEDIATE MEDICAL CARE IF:   You have vaginal bleeding.  Your period lasts longer than 8 days.  Your periods are recurring sooner than 21 days.  You have bleeding after intercourse.  You have severe depression.  You have pain when you urinate.  You have severe headaches.  You have vision problems. Document Released: 01/29/2004 Document Revised: 10/11/2012 Document Reviewed: 07/20/2012 [employer] Patient Information  2015 [employer]. This information is not intended to replace advice given to you by your health care provider. Make sure you discuss any questions you have with your  health care provider.

## 2014-06-11 NOTE — Progress Notes (Signed)
Subjective:     Ruth [name] is a 44 y.o. female presents w/new c/o hot flashes. We discussed recent labs: vit d def & prediabetes. Hot flashes started last week. MC have started to lighten & become shorter in last 5 mos. She missed [place] last Nov. ot flashes involve sweating & are occuring nearly hourly and waking her from sleep. She is having to change pajamas. We discussed variety of interventions including herbals & low dose anti-depressant, indications & potential SE. She started prescription vit D. No intol SE., Discussed indications.  Prediabetes: a1c 5.7. She has made diet & exercise changes in last 4 weeks & lost 4 lbs. Discussed association of increased risk developing DM.  The following portions of the patient's history were reviewed and updated as appropriate: allergies, current medications, past medical history, past social history, past surgical history and problem list.  Review of Systems Pertinent items are noted in HPI.    Objective:    BP 138/88 mmHg  Pulse 76  Temp(Src) 98 F (36.7 C) (Temporal)  Resp 18  Ht 5' 4.5" (1.638 m)  Wt 154 lb (69.854 kg)  BMI 26.04 kg/m2  SpO2 98%  LMP 05/11/2014 (Approximate) BP 138/88 mmHg  Pulse 76  Temp(Src) 98 F (36.7 C) (Temporal)  Resp 18  Ht 5' 4.5" (1.638 m)  Wt 154 lb (69.854 kg)  BMI 26.04 kg/m2  SpO2 98%  LMP 05/11/2014 (Approximate) General appearance: alert, cooperative, appears stated age and no distress Head: Normocephalic, without obvious abnormality, atraumatic Eyes: negative findings: lids and lashes normal and conjunctivae and sclerae normal Neck: slight fullness of thyroid (Thyroid panel nml 4 wks ago), No LAD  Lungs: clear to auscultation bilaterally Heart: regular rate and rhythm, S1, S2 normal, no murmur, click, rub or gallop Neurologic: Grossly normal    Assessment:Plan   1. Vasomotor flushing start - escitalopram (LEXAPRO) 10 MG tablet; Take 1 tablet (10 mg total) by mouth daily.  Dispense: 30  tablet; Refill: 0  2. Prediabetes Continue exercise & diet changes  3. Vitamin D deficiency Continue prescription D. Check level in 8 week  F/u 2 weeks-arthritis, hot flashes

## 2014-06-12 NOTE — Progress Notes (Signed)
This encounter was created in error - please disregard.

## 2014-06-24 ENCOUNTER — Ambulatory Visit (INDEPENDENT_AMBULATORY_CARE_PROVIDER_SITE_OTHER): Payer: BC Managed Care – PPO | Admitting: Nurse Practitioner

## 2014-06-24 ENCOUNTER — Encounter: Payer: Self-pay | Admitting: Nurse Practitioner

## 2014-06-24 VITALS — BP 118/85 | HR 78 | Temp 98.6°F | Resp 18 | Ht 64.5 in | Wt 151.0 lb

## 2014-06-24 DIAGNOSIS — E559 Vitamin D deficiency, unspecified: Secondary | ICD-10-CM | POA: Diagnosis not present

## 2014-06-24 DIAGNOSIS — R232 Flushing: Secondary | ICD-10-CM | POA: Diagnosis not present

## 2014-06-24 MED ORDER — ESCITALOPRAM OXALATE 10 MG PO TABS: 10.0000 mg | ORAL_TABLET | Freq: Every day | ORAL | Status: DC

## 2014-06-24 NOTE — Patient Instructions (Signed)
So glad hot flashes are better! Continue lexapro.  Return in 4 weeks to check vitamin D.  Consider taking 1000 to 2000 mg daily curcumarin for arthritis.  Plan on seeing Dr Raoul Pitch in 6 months regarding hot flashes, or sooner if you have concerns.  Perimenopause Perimenopause is the time when your body begins to move into the menopause (no menstrual period for 12 straight months). It is a natural process. Perimenopause can begin 2-8 years before the menopause and usually lasts for 1 year after the menopause. During this time, your ovaries may or may not produce an egg. The ovaries vary in their production of estrogen and progesterone hormones each month. This can cause irregular menstrual periods, difficulty getting pregnant, vaginal bleeding between periods, and uncomfortable symptoms. CAUSES  Irregular production of the ovarian hormones, estrogen and progesterone, and not ovulating every month.  Other causes include:  Tumor of the pituitary gland in the brain.  Medical disease that affects the ovaries.  Radiation treatment.  Chemotherapy.  Unknown causes.  Heavy smoking and excessive alcohol intake can bring on perimenopause sooner. SIGNS AND SYMPTOMS   Hot flashes.  Night sweats.  Irregular menstrual periods.  Decreased sex drive.  Vaginal dryness.  Headaches.  Mood swings.  Depression.  Memory problems.  Irritability.  Tiredness.  Weight gain.  Trouble getting pregnant.  The beginning of losing bone cells (osteoporosis).  The beginning of hardening of the arteries (atherosclerosis). DIAGNOSIS  Your health care provider will make a diagnosis by analyzing your age, menstrual history, and symptoms. He or she will do a physical exam and note any changes in your body, especially your female organs. Female hormone tests may or may not be helpful depending on the amount of female hormones you produce and when you produce them. However, other hormone tests may  be helpful to rule out other problems. TREATMENT  In some cases, no treatment is needed. The decision on whether treatment is necessary during the perimenopause should be made by you and your health care provider based on how the symptoms are affecting you and your lifestyle. Various treatments are available, such as:  Treating individual symptoms with a specific medicine for that symptom.  Herbal medicines that can help specific symptoms.  Counseling.  Group therapy. HOME CARE INSTRUCTIONS   Keep track of your menstrual periods (when they occur, how heavy they are, how long between periods, and how long they last) as well as your symptoms and when they started.  Only take over-the-counter or prescription medicines as directed by your health care provider.  Sleep and rest.  Exercise.  Eat a diet that contains calcium (good for your bones) and soy (acts like the estrogen hormone).  Do not smoke.  Avoid alcoholic beverages.  Take vitamin supplements as recommended by your health care provider. Taking vitamin E may help in certain cases.  Take calcium and vitamin D supplements to help prevent bone loss.  Group therapy is sometimes helpful.  Acupuncture may help in some cases. SEEK MEDICAL CARE IF:   You have questions about any symptoms you are having.  You need a referral to a specialist (gynecologist, psychiatrist, or psychologist). SEEK IMMEDIATE MEDICAL CARE IF:   You have vaginal bleeding.  Your period lasts longer than 8 days.  Your periods are recurring sooner than 21 days.  You have bleeding after intercourse.  You have severe depression.  You have pain when you urinate.  You have severe headaches.  You have vision problems. Document Released:  01/29/2004 Document Revised: 10/11/2012 Document Reviewed: 07/20/2012 [facility] Patient Information 2015 [place], [place]. This information is not intended to replace advice given to you by your health care provider.  Make sure you discuss any questions you have with your health care provider.

## 2014-06-24 NOTE — Progress Notes (Signed)
Pre visit review using our clinic review tool, if applicable. No additional management support is needed unless otherwise documented below in the visit note. 

## 2014-06-24 NOTE — Progress Notes (Signed)
Subjective:     Ruth [name] is a 44 y.o. female presents for f/u of vasomotor flushing that was causing profuse sweating day & night. She started lexapro  10 mg 2 wks & has had improvement in symptoms: episodes occur about 3 times daily & do not involve sweating. She has had no intolerable SE. She is sleeping through night again. Missed another MC this month. Discussed expectations for perimenopause.    The following portions of the patient's history were reviewed and updated as appropriate: allergies, current medications, past medical history, past social history, past surgical history and problem list.  Review of Systems Pertinent items are noted in HPI.    Objective:    BP 118/85 mmHg  Pulse 78  Temp(Src) 98.6 F (37 C) (Temporal)  Resp 18  Ht 5' 4.5" (1.638 m)  Wt 151 lb (68.493 kg)  BMI 25.53 kg/m2  SpO2 98%  LMP 05/11/2014 (Approximate) BP 118/85 mmHg  Pulse 78  Temp(Src) 98.6 F (37 C) (Temporal)  Resp 18  Ht 5' 4.5" (1.638 m)  Wt 151 lb (68.493 kg)  BMI 25.53 kg/m2  SpO2 98%  LMP 05/11/2014 (Approximate) General appearance: alert, cooperative, appears stated age and no distress Eyes: negative findings: lids and lashes normal and conjunctivae and sclerae normal Lungs: clear to auscultation bilaterally Heart: regular rate and rhythm, S1, S2 normal, no murmur, click, rub or gallop Neurologic: Grossly normal    Assessment:   1. Vasomotor flushing continue - escitalopram (LEXAPRO) 10 MG tablet; Take 1 tablet (10 mg total) by mouth daily.  Dispense: 30 tablet; Refill: 5  F/u 4 wees-vit d level, 6 mos OV for perimenopause

## 2014-06-25 ENCOUNTER — Encounter (HOSPITAL_COMMUNITY): Payer: Self-pay | Admitting: Emergency Medicine

## 2014-06-25 ENCOUNTER — Emergency Department (HOSPITAL_COMMUNITY)
Admission: EM | Admit: 2014-06-25 | Discharge: 2014-06-25 | Disposition: A | Discharge status: Home / Self Care | Payer: BC Managed Care – PPO | Attending: Emergency Medicine | Admitting: Emergency Medicine

## 2014-06-25 DIAGNOSIS — H9319 Tinnitus, unspecified ear: Secondary | ICD-10-CM | POA: Insufficient documentation

## 2014-06-25 DIAGNOSIS — Z3202 Encounter for pregnancy test, result negative: Secondary | ICD-10-CM | POA: Diagnosis not present

## 2014-06-25 DIAGNOSIS — Z880 Allergy status to penicillin: Secondary | ICD-10-CM | POA: Diagnosis not present

## 2014-06-25 DIAGNOSIS — R55 Syncope and collapse: Secondary | ICD-10-CM | POA: Insufficient documentation

## 2014-06-25 LAB — CBC WITH DIFFERENTIAL/PLATELET
Basophils Absolute: 0 10*3/uL (ref 0.0–0.1)
Basophils Relative: 0 % (ref 0–1)
Eosinophils Absolute: 0.1 10*3/uL (ref 0.0–0.7)
Eosinophils Relative: 1 % (ref 0–5)
HCT: 35.8 % — ABNORMAL LOW (ref 36.0–46.0)
Hemoglobin: 11.6 g/dL — ABNORMAL LOW (ref 12.0–15.0)
LYMPHS ABS: 1.1 10*3/uL (ref 0.7–4.0)
LYMPHS PCT: 10 % — AB (ref 12–46)
MCH: 26.3 pg (ref 26.0–34.0)
MCHC: 32.4 g/dL (ref 30.0–36.0)
MCV: 81.2 fL (ref 78.0–100.0)
MONOS PCT: 7 % (ref 3–12)
Monocytes Absolute: 0.7 10*3/uL (ref 0.1–1.0)
NEUTROS ABS: 8.4 10*3/uL — AB (ref 1.7–7.7)
NEUTROS PCT: 82 % — AB (ref 43–77)
Platelets: 272 10*3/uL (ref 150–400)
RBC: 4.41 MIL/uL (ref 3.87–5.11)
RDW: 13.8 % (ref 11.5–15.5)
WBC: 10.2 10*3/uL (ref 4.0–10.5)

## 2014-06-25 LAB — BASIC METABOLIC PANEL
Anion gap: 9 (ref 5–15)
BUN: 16 mg/dL (ref 6–20)
CHLORIDE: 104 mmol/L (ref 101–111)
CO2: 24 mmol/L (ref 22–32)
CREATININE: 0.95 mg/dL (ref 0.44–1.00)
Calcium: 8.7 mg/dL — ABNORMAL LOW (ref 8.9–10.3)
GFR calc Af Amer: >60 mL/min (ref >60)
GFR calc non Af Amer: >60 mL/min (ref >60)
Glucose, Bld: 119 mg/dL — ABNORMAL HIGH (ref 65–99)
Potassium: 3.9 mmol/L (ref 3.5–5.1)
Sodium: 137 mmol/L (ref 135–145)

## 2014-06-25 LAB — I-STAT BETA HCG BLOOD, ED (MC, WL, AP ONLY): I-stat hCG, quantitative: <5 m[IU]/mL (ref <5)

## 2014-06-25 LAB — I-STAT TROPONIN, ED: TROPONIN I, POC: 0.01 ng/mL (ref 0.00–0.08)

## 2014-06-25 LAB — D-DIMER, QUANTITATIVE: D-Dimer, Quant: <0.27 ug/mL-FEU (ref 0.00–0.48)

## 2014-06-25 MED ORDER — SODIUM CHLORIDE 0.9 % IV BOLUS (SEPSIS)
1000.0000 mL | Freq: Once | INTRAVENOUS | Status: AC
  Administered 2014-06-25: 1000 mL via INTRAVENOUS

## 2014-06-25 NOTE — Discharge Instructions (Signed)
Your workup today has been very reassuring. Recommend that you remain well hydrated by drinking plenty of fluids. Follow-up with your primary care doctor to discuss your symptoms which brought you to the emergency department today. It may be beneficial for you to wear a Holter monitor, especially if you continue to experience episodes of fainting/passing out. Return to the emergency department as needed if symptoms worsen.  Syncope Syncope is a medical term for fainting or passing out. This means you lose consciousness and drop to the ground. People are generally unconscious for less than 5 minutes. You may have some muscle twitches for up to 15 seconds before waking up and returning to normal. Syncope occurs more often in older adults, but it can happen to anyone. While most causes of syncope are not dangerous, syncope can be a sign of a serious medical problem. It is important to seek medical care.  CAUSES  Syncope is caused by a sudden drop in blood flow to the brain. The specific cause is often not determined. Factors that can bring on syncope include:  Taking medicines that lower blood pressure.  Sudden changes in posture, such as standing up quickly.  Taking more medicine than prescribed.  Standing in one place for too long.  Seizure disorders.  Dehydration and excessive exposure to heat.  Low blood sugar (hypoglycemia).  Straining to have a bowel movement.  Heart disease, irregular heartbeat, or other circulatory problems.  Fear, emotional distress, seeing blood, or severe pain. SYMPTOMS  Right before fainting, you may:  Feel dizzy or light-headed.  Feel nauseous.  See all white or all black in your field of vision.  Have cold, clammy skin. DIAGNOSIS  Your health care provider will ask about your symptoms, perform a physical exam, and perform an electrocardiogram (ECG) to record the electrical activity of your heart. Your health care provider may also perform other heart  or blood tests to determine the cause of your syncope which may include:  Transthoracic echocardiogram (TTE). During echocardiography, sound waves are used to evaluate how blood flows through your heart.  Transesophageal echocardiogram (TEE).  Cardiac monitoring. This allows your health care provider to monitor your heart rate and rhythm in real time.  Holter monitor. This is a portable device that records your heartbeat and can help diagnose heart arrhythmias. It allows your health care provider to track your heart activity for several days, if needed.  Stress tests by exercise or by giving medicine that makes the heart beat faster. TREATMENT  In most cases, no treatment is needed. Depending on the cause of your syncope, your health care provider may recommend changing or stopping some of your medicines. HOME CARE INSTRUCTIONS  Have someone stay with you until you feel stable.  Do not drive, use machinery, or play sports until your health care provider says it is okay.  Keep all follow-up appointments as directed by your health care provider.  Lie down right away if you start feeling like you might faint. Breathe deeply and steadily. Wait until all the symptoms have passed.  Drink enough fluids to keep your urine clear or pale yellow.  If you are taking blood pressure or heart medicine, get up slowly and take several minutes to sit and then stand. This can reduce dizziness. SEEK IMMEDIATE MEDICAL CARE IF:   You have a severe headache.  You have unusual pain in the chest, abdomen, or back.  You are bleeding from your mouth or rectum, or you have black or tarry  stool.  You have an irregular or very fast heartbeat.  You have pain with breathing.  You have repeated fainting or seizure-like jerking during an episode.  You faint when sitting or lying down.  You have confusion.  You have trouble walking.  You have severe weakness.  You have vision problems. If you fainted,  call your local emergency services (911 in [place] Do not drive yourself to the hospital.  MAKE SURE YOU:  Understand these instructions.  Will watch your condition.  Will get help right away if you are not doing well or get worse. Document Released: 12/21/2004 Document Revised: 12/26/2012 Document Reviewed: 02/19/2011 [facility] Patient Information 2015 [place], [place]. This information is not intended to replace advice given to you by your health care provider. Make sure you discuss any questions you have with your health care provider.

## 2014-06-25 NOTE — ED Notes (Signed)
Per EMS patient called ems due to 2 syncopal episodes today. Patient was given 1 L of fluid by EMS.

## 2014-06-25 NOTE — ED Notes (Signed)
Patient ambulated fine to bathroom and back.

## 2014-06-25 NOTE — ED Provider Notes (Signed)
CSN: [id number]     Arrival date & time 06/25/14  2009 History   First MD Initiated Contact with Patient 06/25/14 2011     Chief Complaint  Patient presents with  . Loss of Consciousness     (Consider location/radiation/quality/duration/timing/severity/associated sxs/prior Treatment) HPI Comments: Patient is a 44 year old female with no significant past medical history who presents to the emergency department for further evaluation of syncope. Patient reports that she was helping her mother put up some blinds when she began to feel hot and flushed. Patient remembers sitting down in a chair at the table and taking a sip of water. She reports that she remembers her mother on the phone with EMS. Patient had a second syncopal event while EMS was at the home where she took approximately 90 seconds to regain consciousness. Patient reports a similar episode in 2007. She had a reassuring workup at this time, but found that her vitamin D level was low. She has had no problems with frequency syncope over the last 9 years. Patient denies any lightheadedness or dizziness prior to her syncopal event. She further denies associated chest pain or shortness of breath. She recalls some associated tinnitus prior to and following her syncope. No reported headache, vision changes, extremity numbness/weakness, nausea, vomiting, abdominal pain, or leg swelling. No recent fevers or prolonged heat exposure.   Patient is a 44 y.o. female presenting with syncope. The history is provided by the patient. No language interpreter was used.  Loss of Consciousness Associated symptoms: no chest pain, no fever, no headaches, no nausea, no shortness of breath, no vomiting and no weakness     History reviewed. No pertinent past medical history. History reviewed. No pertinent past surgical history. No family history on file. History  Substance Use Topics  . Smoking status: Never Smoker   . Smokeless tobacco: Not on file  .  Alcohol Use: No   OB History    No data available      Review of Systems  Constitutional: Negative for fever.  HENT: Positive for tinnitus.   Respiratory: Negative for shortness of breath.   Cardiovascular: Positive for syncope. Negative for chest pain.  Gastrointestinal: Negative for nausea and vomiting.  Neurological: Positive for syncope. Negative for weakness, numbness and headaches.  All other systems reviewed and are negative.   Allergies  Other and Amoxicillin  Home Medications   Prior to Admission medications   Medication Sig Start Date End Date Taking? Authorizing Provider  Cholecalciferol 50000 UNITS TABS Take 50,000 Units by mouth every Friday.   Yes Historical Provider, MD  escitalopram (LEXAPRO) 10 MG tablet Take 10 mg by mouth at bedtime.   Yes Historical Provider, MD  meloxicam (MOBIC) 15 MG tablet Take 15 mg by mouth as needed for pain.   Yes Historical Provider, MD   BP 112/70 mmHg  Pulse 83  Temp(Src) 98 F (36.7 C) (Oral)  Resp 14  Ht 5\' 4"  (1.626 m)  Wt 151 lb (68.493 kg)  BMI 25.91 kg/m2  SpO2 95%  LMP 05/11/2014 (Exact Date)   Physical Exam  Constitutional: She is oriented to person, place, and time. She appears well-developed and well-nourished. No distress.  Nontoxic/nonseptic appearing  HENT:  Head: Normocephalic and atraumatic.  Mouth/Throat: Oropharynx is clear and moist. No oropharyngeal exudate.  Uvula midline. Symmetric rise of the uvula with phonation  Eyes: Conjunctivae and EOM are normal. Pupils are equal, round, and reactive to light. No scleral icterus.  Neck: Normal range of motion.  Cardiovascular: Normal rate, regular rhythm and intact distal pulses.   Pulmonary/Chest: Effort normal and breath sounds normal. No respiratory distress. She has no wheezes. She has no rales.  Respirations even and unlabored. Lungs clear.  Abdominal: Soft. She exhibits no distension. There is no tenderness. There is no rebound and no guarding.  Soft,  nontender abdomen  Musculoskeletal: Normal range of motion.  Neurological: She is alert and oriented to person, place, and time. No cranial nerve deficit. She exhibits normal muscle tone. Coordination normal.  GCS 15. Speech is goal oriented. No focal neurologic deficits appreciated. Patient moves extremities without ataxia. Normal sensation and strength against resistance bilaterally.  Skin: Skin is warm and dry. No rash noted. She is not diaphoretic. No erythema. No pallor.  Psychiatric: She has a normal mood and affect. Her behavior is normal.  Nursing note and vitals reviewed.   ED Course  Procedures (including critical care time) Labs Review Labs Reviewed  CBC WITH DIFFERENTIAL/PLATELET - Abnormal; Notable for the following:    Hemoglobin 11.6 (*)    HCT 35.8 (*)    Neutrophils Relative % 82 (*)    Neutro Abs 8.4 (*)    Lymphocytes Relative 10 (*)    All other components within normal limits  BASIC METABOLIC PANEL - Abnormal; Notable for the following:    Glucose, Bld 119 (*)    Calcium 8.7 (*)    All other components within normal limits  D-DIMER, QUANTITATIVE (NOT AT [facility])  I-STAT BETA HCG BLOOD, ED ([facility], [facility], [facility] ONLY)  I-STAT TROPOININ, ED    Imaging Review No results found.   EKG Interpretation   Date/Time:  Tuesday June 25 2014 20:35:48 EDT Ventricular Rate:  75 PR Interval:  171 QRS Duration: 94 QT Interval:  389 QTC Calculation: 434 R Axis:   3 Text Interpretation:  Sinus rhythm Sinus rhythm Artifact Abnormal ekg  Confirmed by Carmin Muskrat  MD [phone]) on 06/25/2014 9:25:48 PM      MDM   Final diagnoses:  Syncope, unspecified syncope type    44 year old female presents to the emergency department for further evaluation of syncope. Patient feels fine on arrival to the emergency department. She was hydrated with 1 L IV fluids by EMS. Hydration continued in the emergency department with an additional 1 L of fluids. Patient denies any lightheadedness,  dizziness, chest pain, or shortness of breath contributing to her syncope today. She has anemia on workup which is mild and consistent with her prior workups. No electrolyte imbalance. Kidney function preserved. No leukocytosis. Doubt cardiac etiology of syncope. Patient has a nonischemic EKG as well as a negative troponin. Also doubt pulmonary embolus as her d-dimer is negative. Patient is PERC negative. She had a normal TSH and T4 recently, done by her PCP office. Orthostatics stable on exam.  Patient ambulated in the emergency department without difficulty. She continues to state that she is feeling fine and that she is comfortable with discharge. No indication for further emergent workup at this time. Patient will be discharged with instruction to follow-up with her primary care doctor regarding her syncopal events; syncope likely vasovagal. Have advised continued fluid hydration. Return precautions discussed and provided. Patient agreeable to plan with no unaddressed concerns. Patient discharged in good condition.   Filed Vitals:   06/25/14 2145 06/25/14 2200 06/25/14 2215 06/25/14 2233  BP: 105/69 112/71 112/70   Pulse: 72 73 83   Temp:    98 F (36.7 C)  TempSrc:    Oral  Resp: 16 19 14    Height:      Weight:      SpO2: 100% 99% 95%       Antonietta Breach, PA-C 06/25/14 2250  Carmin Muskrat, MD 06/26/14 [phone]

## 2014-06-26 ENCOUNTER — Encounter: Payer: Self-pay | Admitting: Nurse Practitioner

## 2014-07-24 ENCOUNTER — Other Ambulatory Visit (INDEPENDENT_AMBULATORY_CARE_PROVIDER_SITE_OTHER): Payer: BC Managed Care – PPO

## 2014-07-24 DIAGNOSIS — E559 Vitamin D deficiency, unspecified: Secondary | ICD-10-CM | POA: Diagnosis not present

## 2014-07-24 LAB — VITAMIN D 25 HYDROXY (VIT D DEFICIENCY, FRACTURES): VITD: 48.26 ng/mL (ref 30.00–100.00)

## 2014-07-25 ENCOUNTER — Telehealth: Payer: Self-pay | Admitting: Nurse Practitioner

## 2014-07-25 NOTE — Telephone Encounter (Signed)
pls call pt: Advise Vit d  Looks good. Start maintenance dose of 2000 iu D3 daily with meal.

## 2014-07-25 NOTE — Telephone Encounter (Signed)
Left detailed message on pt's cell.  Okay per DPR. 

## 2014-09-27 ENCOUNTER — Encounter: Payer: Self-pay | Admitting: Family Medicine

## 2014-09-27 ENCOUNTER — Ambulatory Visit (INDEPENDENT_AMBULATORY_CARE_PROVIDER_SITE_OTHER): Payer: BC Managed Care – PPO | Admitting: Family Medicine

## 2014-09-27 VITALS — BP 115/82 | HR 103 | Temp 98.6°F | Resp 18 | Ht 64.5 in | Wt 156.0 lb

## 2014-09-27 DIAGNOSIS — J029 Acute pharyngitis, unspecified: Secondary | ICD-10-CM

## 2014-09-27 DIAGNOSIS — J019 Acute sinusitis, unspecified: Secondary | ICD-10-CM | POA: Insufficient documentation

## 2014-09-27 DIAGNOSIS — J0101 Acute recurrent maxillary sinusitis: Secondary | ICD-10-CM

## 2014-09-27 LAB — POCT RAPID STREP A (OFFICE): RAPID STREP A SCREEN: NEGATIVE

## 2014-09-27 MED ORDER — FLUTICASONE PROPIONATE 50 MCG/ACT NA SUSP: 2.0000 | Freq: Every day | NASAL | Status: DC

## 2014-09-27 MED ORDER — DOXYCYCLINE HYCLATE 100 MG PO TABS: 100.0000 mg | ORAL_TABLET | Freq: Two times a day (BID) | ORAL | Status: DC

## 2014-09-27 NOTE — Progress Notes (Signed)
Pre visit review using our clinic review tool, if applicable. No additional management support is needed unless otherwise documented below in the visit note. 

## 2014-09-27 NOTE — Progress Notes (Signed)
   Subjective:    Patient ID: Ruth [name], female    DOB: 03/13/1970, 44 y.o.   MRN: [id number]  HPI   sore throat: patient presents for an acute visit with a one-week history of illness. She states it first started off as a sinus headache and facial pressure mostly on the right side. She started taking Claritin-D , as this usually works for her sinus pressure and pain , but her symptoms became worse. She started saltwater gargles after her throat started to become sore.  she is taking Advil for the headache.  She is tolerating fluids in meals.  She endorses fever and chills over the last few nights.She denies any nausea, vomit, diarrhea or rash. 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  She is allergic to amoxicillin. Past Medical History  Diagnosis Date  . Allergy   . Asthma   . Arthritis   . HV (hallux valgus)     repaired as child   Allergies  Allergen Reactions  . Other Anaphylaxis    Seafood    . Amoxicillin Rash  . Amoxicillin Rash  . Shellfish-Derived Products Rash and Swelling   Social History   Social History  . Marital Status: Single    Spouse Name: N/A  . Number of Children: 0  . Years of Education: N/A   Occupational History  . teacher     HS, basketball coach   Social History Main Topics  . Smoking status: Never Smoker   . Smokeless tobacco: Not on file  . Alcohol Use: No     Comment: occasional  . Drug Use: No  . Sexual Activity: Not on file   Other Topics Concern  . Not on file   Social History Narrative   ** Merged History Encounter **       Ms Caccamo works FT as Pharmacist, hospital. She lives alone.     Review of Systems Negative, with the exception of above mentioned in HPI      Objective:   Physical Exam BP 115/82 mmHg  Pulse 103  Temp(Src) 98.6 F (37 C) (Temporal)  Resp 18  Ht 5' 4.5" (1.638 m)  Wt 156 lb (70.761 kg)  BMI 26.37 kg/m2  SpO2 100% Gen: Afebrile. No acute distress.  Nontoxic in appearance, well-developed,  well-nourished African-American female. Very pleasant. HENT: AT. [place]. Bilateral TM visualized and normal in appearance. MMM. Bilateral nares  With erythema and swelling. Throat without erythema or exudates.  Tender to palpation right maxillary sinus. Mild hoarseness on exam. Mild cough on exam. Eyes:Pupils Equal Round Reactive to light, Extraocular movements intact,  Conjunctiva without redness, discharge or icterus. Neck/lymp/endocrine: Supple, right cervical lymphadenopathy CV: RRR Chest: CTAB, no wheeze or crackles Skin:  no rashes, purpura or petechiae. .      Assessment & Plan:  1. Sore throat - POCT rapid strep A negative   2. Acute recurrent maxillary sinusitis -  Patient with signs and symptoms  maxillary sinusitis today, encouraged rest, hydration, Advil as needed. Prescribe Flonase and doxycycline. - fluticasone (FLONASE) 50 MCG/ACT nasal spray; Place 2 sprays into both nostrils daily.  Dispense: 16 g; Refill: 6 - doxycycline (VIBRA-TABS) 100 MG tablet; Take 1 tablet (100 mg total) by mouth 2 (two) times daily.  Dispense: 20 tablet; Refill: 0   follow-up in 1 week if no improvement.

## 2014-09-27 NOTE — Patient Instructions (Signed)
Rest, hydration, advil (for headache/muscle aches) , flonase daily for at least 2 weeks.  Doxycycline for ten days, 2x day.  Sinusitis Sinusitis is redness, soreness, and inflammation of the paranasal sinuses. Paranasal sinuses are air pockets within the bones of your face (beneath the eyes, the middle of the forehead, or above the eyes). In healthy paranasal sinuses, mucus is able to drain out, and air is able to circulate through them by way of your nose. However, when your paranasal sinuses are inflamed, mucus and air can become trapped. This can allow bacteria and other germs to grow and cause infection. Sinusitis can develop quickly and last only a short time (acute) or continue over a long period (chronic). Sinusitis that lasts for more than 12 weeks is considered chronic.  CAUSES  Causes of sinusitis include:  Allergies.  Structural abnormalities, such as displacement of the cartilage that separates your nostrils (deviated septum), which can decrease the air flow through your nose and sinuses and affect sinus drainage.  Functional abnormalities, such as when the small hairs (cilia) that line your sinuses and help remove mucus do not work properly or are not present. SIGNS AND SYMPTOMS  Symptoms of acute and chronic sinusitis are the same. The primary symptoms are pain and pressure around the affected sinuses. Other symptoms include:  Upper toothache.  Earache.  Headache.  Bad breath.  Decreased sense of smell and taste.  A cough, which worsens when you are lying flat.  Fatigue.  Fever.  Thick drainage from your nose, which often is green and may contain pus (purulent).  Swelling and warmth over the affected sinuses. DIAGNOSIS  Your health care provider will perform a physical exam. During the exam, your health care provider may:  Look in your nose for signs of abnormal growths in your nostrils (nasal polyps).  Tap over the affected sinus to check for signs of  infection.  View the inside of your sinuses (endoscopy) using an imaging device that has a light attached (endoscope). If your health care provider suspects that you have chronic sinusitis, one or more of the following tests may be recommended:  Allergy tests.  Nasal culture. A sample of mucus is taken from your nose, sent to a lab, and screened for bacteria.  Nasal cytology. A sample of mucus is taken from your nose and examined by your health care provider to determine if your sinusitis is related to an allergy. TREATMENT  Most cases of acute sinusitis are related to a viral infection and will resolve on their own within 10 days. Sometimes medicines are prescribed to help relieve symptoms (pain medicine, decongestants, nasal steroid sprays, or saline sprays).  However, for sinusitis related to a bacterial infection, your health care provider will prescribe antibiotic medicines. These are medicines that will help kill the bacteria causing the infection.  Rarely, sinusitis is caused by a fungal infection. In theses cases, your health care provider will prescribe antifungal medicine. For some cases of chronic sinusitis, surgery is needed. Generally, these are cases in which sinusitis recurs more than 3 times per year, despite other treatments. HOME CARE INSTRUCTIONS   Drink plenty of water. Water helps thin the mucus so your sinuses can drain more easily.  Use a humidifier.  Inhale steam 3 to 4 times a day (for example, sit in the bathroom with the shower running).  Apply a warm, moist washcloth to your face 3 to 4 times a day, or as directed by your health care provider.  Use  saline nasal sprays to help moisten and clean your sinuses.  Take medicines only as directed by your health care provider.  If you were prescribed either an antibiotic or antifungal medicine, finish it all even if you start to feel better. SEEK IMMEDIATE MEDICAL CARE IF:  You have increasing pain or severe  headaches.  You have nausea, vomiting, or drowsiness.  You have swelling around your face.  You have vision problems.  You have a stiff neck.  You have difficulty breathing. MAKE SURE YOU:   Understand these instructions.  Will watch your condition.  Will get help right away if you are not doing well or get worse. Document Released: 12/21/2004 Document Revised: 05/07/2013 Document Reviewed: 01/05/2011 [facility] Patient Information 2015 [place], [place]. This information is not intended to replace advice given to you by your health care provider. Make sure you discuss any questions you have with your health care provider.

## 2014-09-30 ENCOUNTER — Telehealth: Payer: Self-pay | Admitting: Family Medicine

## 2014-09-30 MED ORDER — AZITHROMYCIN 250 MG PO TABS: ORAL_TABLET | ORAL | Status: DC

## 2014-09-30 NOTE — Telephone Encounter (Signed)
Left detailed message of new Rx at pharmacy.  Okay to leave message per DPR.

## 2014-09-30 NOTE — Telephone Encounter (Signed)
I have called in a Z-pack (different abx). She can discontinue the doxy if she is unable to tolerate it (make sure she was no taking it on an empty stomach). Thanks.

## 2014-09-30 NOTE — Telephone Encounter (Signed)
Patient states that she began to vomit after the second dose of antibiotics.  Pt wants to stop antibiotic because she is not tolerating them.  She has continued to try to take antibiotics over the weekend but has not stopped vomiting.  Please advise if she can just stop or if you need to replace antibiotics.

## 2014-12-23 ENCOUNTER — Ambulatory Visit: Payer: BC Managed Care – PPO | Admitting: Family Medicine

## 2014-12-25 ENCOUNTER — Ambulatory Visit (INDEPENDENT_AMBULATORY_CARE_PROVIDER_SITE_OTHER): Payer: BC Managed Care – PPO | Admitting: Family Medicine

## 2014-12-25 ENCOUNTER — Encounter: Payer: Self-pay | Admitting: Family Medicine

## 2014-12-25 VITALS — BP 120/81 | HR 74 | Temp 98.3°F | Resp 20 | Wt 159.5 lb

## 2014-12-25 DIAGNOSIS — H9201 Otalgia, right ear: Secondary | ICD-10-CM | POA: Diagnosis not present

## 2014-12-25 DIAGNOSIS — Z23 Encounter for immunization: Secondary | ICD-10-CM | POA: Diagnosis not present

## 2014-12-25 DIAGNOSIS — R7303 Prediabetes: Secondary | ICD-10-CM | POA: Diagnosis not present

## 2014-12-25 LAB — POCT GLYCOSYLATED HEMOGLOBIN (HGB A1C): HEMOGLOBIN A1C: 5.4

## 2014-12-25 NOTE — Progress Notes (Signed)
   Subjective:    Patient ID: Ruth [name], female    DOB: 11/07/1970, 44 y.o.   MRN: [id number]  HPI   Prediabetes: Pt presents for follow up on prediabetes. Her last a1c 5.7 about 6 months ago. She has started watching her diet, making healthier fresh choices, cutting back on fats and sugars. She is restarted her exercise program and running. She has gain 3.5 lbs, but states she feels it is muscle/toning from weight lifting because her clothes are getting big on her. She over-all feels much better.   Rt ear fullness: pt presents with right ear fullness of a few weeks in duration. She denies any hearing loss or pain. She states she feels like it needs to drain. She has not been taking her flonase or claritin but once last week. She denies fever, chills, nasal congestion, rhinorrhea, headaches, sore throat or sinus pressure.   Health maintenance: Flu shot indicated Never smoker   Past Medical History  Diagnosis Date  . Allergy   . Asthma   . Arthritis   . HV (hallux valgus)     repaired as child   Allergies  Allergen Reactions  . Other Anaphylaxis    Seafood    . Amoxicillin Rash  . Amoxicillin Rash  . Shellfish-Derived Products Rash and Swelling    Review of Systems Negative, with the exception of above mentioned in HPI     Objective:   Physical Exam BP 120/81 mmHg  Pulse 74  Temp(Src) 98.3 F (36.8 C)  Resp 20  Wt 159 lb 8 oz (72.349 kg)  SpO2 98%  LMP 12/16/2014 Gen: Afebrile. No acute distress. Nontoxic in appearance. Well developed, well nourished AAF. Very pleasant.  HENT: AT. [place]. Bilateral TM visualized, left TM normal. Right TM with mild air fluid level, no erythema no bulging, EOC normal. MMM, no oral lesions. Bilateral nares without erythema or swelling. Throat without erythema or exudates. *No cough or hoarseness on exam.  Eyes:Pupils Equal Round Reactive to light, Extraocular movements intact,  Conjunctiva without redness, discharge or  icterus. Neck/lymp/endocrine: Supple,No lymphadenopathy CV: RRR  Chest: CTAB, no wheeze or crackles    Assessment & Plan:  1. Prediabetes - continue dietary changes and exercise.  - POCT HgB A1C--> 5.4, improved   2. Discomfort of right ear - Flonase and allegra recommended.  - Sinus/allergy related changes, no infection. - Galbreath maneuver instructed  - AVS provided  3. Health maintenance: Flu shot administered today. F/U 6 months for CPE

## 2014-12-25 NOTE — Patient Instructions (Signed)
Serous Otitis Media Serous otitis media is fluid in the middle ear space. This space contains the bones for hearing and air. Air in the middle ear space helps to transmit sound.  The air gets there through the eustachian tube. This tube goes from the back of the nose (nasopharynx) to the middle ear space. It keeps the pressure in the middle ear the same as the outside world. It also helps to drain fluid from the middle ear space. CAUSES  Serous otitis media occurs when the eustachian tube gets blocked. Blockage can come from:  Ear infections.  Colds and other upper respiratory infections.  Allergies.  Irritants such as cigarette smoke.  Sudden changes in air pressure (such as descending in an airplane).  Enlarged adenoids.  A mass in the nasopharynx. During colds and upper respiratory infections, the middle ear space can become temporarily filled with fluid. This can happen after an ear infection also. Once the infection clears, the fluid will generally drain out of the ear through the eustachian tube. If it does not, then serous otitis media occurs. SIGNS AND SYMPTOMS   Hearing loss.  A feeling of fullness in the ear, without pain.  Young children may not show any symptoms but may show slight behavioral changes, such as agitation, ear pulling, or crying. DIAGNOSIS  Serous otitis media is diagnosed by an ear exam. Tests may be done to check on the movement of the eardrum. Hearing exams may also be done. TREATMENT  The fluid most often goes away without treatment. If allergy is the cause, allergy treatment may be helpful. Fluid that persists for several months may require minor surgery. A small tube is placed in the eardrum to:  Drain the fluid.  Restore the air in the middle ear space. In certain situations, antibiotic medicines are used to avoid surgery. Surgery may be done to remove enlarged adenoids (if this is the cause). HOME CARE INSTRUCTIONS   Keep children away from  tobacco smoke.  Keep all follow-up visits as directed by your health care provider. SEEK MEDICAL CARE IF:   Your hearing is not better in 3 months.  Your hearing is worse.  You have ear pain.  You have drainage from the ear.  You have dizziness.  You have serous otitis media only in one ear or have any bleeding from your nose (epistaxis).  You notice a lump on your neck. MAKE SURE YOU:  Understand these instructions.   Will watch your condition.   Will get help right away if you are not doing well or get worse.    This information is not intended to replace advice given to you by your health care provider. Make sure you discuss any questions you have with your health care provider.   Document Released: 03/13/2003 Document Revised: 01/11/2014 Document Reviewed: 07/18/2012 Elsevier Interactive Patient Education [employer].   F/U 6 months with CPE and fasting labs prior

## 2015-01-13 ENCOUNTER — Ambulatory Visit (INDEPENDENT_AMBULATORY_CARE_PROVIDER_SITE_OTHER): Payer: BC Managed Care – PPO | Admitting: Family Medicine

## 2015-01-13 ENCOUNTER — Encounter: Payer: Self-pay | Admitting: Family Medicine

## 2015-01-13 VITALS — BP 116/80 | HR 84 | Temp 97.5°F | Resp 20 | Wt 156.8 lb

## 2015-01-13 DIAGNOSIS — A084 Viral intestinal infection, unspecified: Secondary | ICD-10-CM | POA: Diagnosis not present

## 2015-01-13 DIAGNOSIS — J069 Acute upper respiratory infection, unspecified: Secondary | ICD-10-CM | POA: Diagnosis not present

## 2015-01-13 MED ORDER — DOXYCYCLINE HYCLATE 100 MG PO TABS: 100.0000 mg | ORAL_TABLET | Freq: Two times a day (BID) | ORAL | Status: DC

## 2015-01-13 NOTE — Progress Notes (Signed)
   Subjective:    Patient ID: Ruth [name], female    DOB: 04-01-1970, 45 y.o.   MRN: [id number]  HPI  Viral syndrome: Patient returns for an acute office visit with continued discomfort in her right ear. She states that after treatment of her sinusitis on her last visit, she did have improvement. She has continued taking the Allegra and Flonase daily. She states over the last 5 days she felt increased drainage in her right ear, and occasional sharp pain. Over the weekend she felt feverish, and Saturday she felt run down and more week. She then started experiencing vomit and diarrhea.  His morning she was able to tolerate fluids, and a small breakfast.   Past Medical History  Diagnosis Date  . Allergy   . Asthma   . Arthritis   . HV (hallux valgus)     repaired as child   Allergies  Allergen Reactions  . Other Anaphylaxis    Seafood    . Amoxicillin Rash  . Amoxicillin Rash  . Shellfish-Derived Products Rash and Swelling   Social History  Substance Use Topics  . Smoking status: Never Smoker   . Smokeless tobacco: None  . Alcohol Use: No     Comment: occasional     Review of Systems Negative, with the exception of above mentioned in HPI     Objective:   Physical Exam BP 116/80 mmHg  Pulse 84  Temp(Src) 97.5 F (36.4 C)  Resp 20  Wt 156 lb 12 oz (71.101 kg)  SpO2 95%  LMP 12/16/2014 Gen: Afebrile. No acute distress. Nontoxic in appearance, well-developed, well-nourished, African-American female. Pleasant. HENT: AT. [place]. Bilateral TM visualized fluid filled, no erythema or bulging MMM, no oral lesions. Bilateral nares erythema, mild swelling, excoriations present. Throat without erythema or exudates. No tenderness to palpation maxillary sinuses. No cough on exam. No hoarseness on exam. Eyes:Pupils Equal Round Reactive to light, Extraocular movements intact,  Conjunctiva without redness, discharge or icterus. Neck/lymp/endocrine: Supple, no lymphadenopathy CV: RRR    Chest: CTAB, no wheeze or crackles. Good air movement, normal respiratory effort Abd: Soft. Flat. NTND. BS hyperactive. No Masses palpated.  Skin: No rashes, purpura or petechiae.  Neuro:  Normal gait. PERLA. EOMi. Alert. Oriented x3    Assessment & Plan:  1. Viral URI - Discuss and symmetric patient with patient today. She is to use Flonase and Allegra for upper respiratory symptoms. Continue to maintain good hydration and rest. Nasal saline 3 times a day for nasal dryness. Continue humidifier at night. - Prescription for doxycycline was printed, patient aware not to have this filled unless her upper respiratory symptoms are worsening, or not improving in 5 days. - doxycycline (VIBRA-TABS) 100 MG tablet; Take 1 tablet (100 mg total) by mouth 2 (two) times daily.  Dispense: 20 tablet; Refill: 0  2. Viral gastroenteritis - Patient's clinical symptoms are likely from a viral syndrome. Appears to be more vomiting and diarrhea over the last 2 days. Patient declined nausea medication today. Discussed with her maintaining good hydration, rest. Try to avoid high sugar/lactose containing fluids as this can make her diarrhea worse.  - Follow-up as needed

## 2015-01-13 NOTE — Patient Instructions (Signed)
Doxycyline prescription in hand, if not improving or worsening in 4 days then start medicine.  Floanse, allegra, Hydrate, rest, nasal saline Avoid milk products until 24 hours after diarrhea resolved.  Viral Gastroenteritis Viral gastroenteritis is also known as stomach flu. This condition affects the stomach and intestinal tract. It can cause sudden diarrhea and vomiting. The illness typically lasts 3 to 8 days. Most people develop an immune response that eventually gets rid of the virus. While this natural response develops, the virus can make you quite ill. CAUSES  Many different viruses can cause gastroenteritis, such as rotavirus or noroviruses. You can catch one of these viruses by consuming contaminated food or water. You may also catch a virus by sharing utensils or other personal items with an infected person or by touching a contaminated surface. SYMPTOMS  The most common symptoms are diarrhea and vomiting. These problems can cause a severe loss of body fluids (dehydration) and a body salt (electrolyte) imbalance. Other symptoms may include:  Fever.  Headache.  Fatigue.  Abdominal pain. DIAGNOSIS  Your caregiver can usually diagnose viral gastroenteritis based on your symptoms and a physical exam. A stool sample may also be taken to test for the presence of viruses or other infections. TREATMENT  This illness typically goes away on its own. Treatments are aimed at rehydration. The most serious cases of viral gastroenteritis involve vomiting so severely that you are not able to keep fluids down. In these cases, fluids must be given through an intravenous line (IV). HOME CARE INSTRUCTIONS   Drink enough fluids to keep your urine clear or pale yellow. Drink small amounts of fluids frequently and increase the amounts as tolerated.  Ask your caregiver for specific rehydration instructions.  Avoid:  Foods high in sugar.  Alcohol.  Carbonated  drinks.  Tobacco.  Juice.  Caffeine drinks.  Extremely hot or cold fluids.  Fatty, greasy foods.  Too much intake of anything at one time.  Dairy products until 24 to 48 hours after diarrhea stops.  You may consume probiotics. Probiotics are active cultures of beneficial bacteria. They may lessen the amount and number of diarrheal stools in adults. Probiotics can be found in yogurt with active cultures and in supplements.  Wash your hands well to avoid spreading the virus.  Only take over-the-counter or prescription medicines for pain, discomfort, or fever as directed by your caregiver. Do not give aspirin to children. Antidiarrheal medicines are not recommended.  Ask your caregiver if you should continue to take your regular prescribed and over-the-counter medicines.  Keep all follow-up appointments as directed by your caregiver. SEEK IMMEDIATE MEDICAL CARE IF:   You are unable to keep fluids down.  You do not urinate at least once every 6 to 8 hours.  You develop shortness of breath.  You notice blood in your stool or vomit. This may look like coffee grounds.  You have abdominal pain that increases or is concentrated in one small area (localized).  You have persistent vomiting or diarrhea.  You have a fever.  The patient is a child younger than 3 months, and he or she has a fever.  The patient is a child older than 3 months, and he or she has a fever and persistent symptoms.  The patient is a child older than 3 months, and he or she has a fever and symptoms suddenly get worse.  The patient is a baby, and he or she has no tears when crying. MAKE SURE YOU:  Understand these instructions.  Will watch your condition.  Will get help right away if you are not doing well or get worse.   This information is not intended to replace advice given to you by your health care provider. Make sure you discuss any questions you have with your health care provider.    Document Released: 12/21/2004 Document Revised: 03/15/2011 Document Reviewed: 10/07/2010 Elsevier Interactive Patient Education [employer].

## 2015-05-29 ENCOUNTER — Ambulatory Visit: Payer: Self-pay | Admitting: Family Medicine

## 2015-06-16 ENCOUNTER — Encounter: Payer: Self-pay | Admitting: Family Medicine

## 2015-06-16 ENCOUNTER — Ambulatory Visit (INDEPENDENT_AMBULATORY_CARE_PROVIDER_SITE_OTHER): Payer: BC Managed Care – PPO | Admitting: Family Medicine

## 2015-06-16 VITALS — BP 125/85 | HR 66 | Temp 97.8°F | Resp 20 | Ht 64.0 in | Wt 159.8 lb

## 2015-06-16 DIAGNOSIS — K219 Gastro-esophageal reflux disease without esophagitis: Secondary | ICD-10-CM | POA: Diagnosis not present

## 2015-06-16 DIAGNOSIS — R112 Nausea with vomiting, unspecified: Secondary | ICD-10-CM

## 2015-06-16 DIAGNOSIS — H9201 Otalgia, right ear: Secondary | ICD-10-CM

## 2015-06-16 MED ORDER — ONDANSETRON HCL 4 MG PO TABS: 4.0000 mg | ORAL_TABLET | Freq: Three times a day (TID) | ORAL | Status: DC | PRN

## 2015-06-16 MED ORDER — OMEPRAZOLE 20 MG PO CPDR: 20.0000 mg | DELAYED_RELEASE_CAPSULE | Freq: Every day | ORAL | Status: DC

## 2015-06-16 NOTE — Patient Instructions (Signed)

## 2015-06-16 NOTE — Progress Notes (Addendum)
Patient ID: Ruth [name], female   DOB: Jun 26, 1970, 45 y.o.   MRN: [id number]    Ruth [name] , 01-23-1970, 45 y.o., female MRN: [id number]  CC: diarrhea Subjective: Pt presents for an acute OV with complaints of diarrhea of 2 days duration. Associated symptoms include vomiting and increase in reflux symptoms. Patient tried Pepto bismol and started belching and had relief with belching. She is feeling full quicker. She also complains of left sided sore throat, bad test in her mouth and left ear discomfort. She reports diarrhea started last week and has resolved, although that was the intent of her appt today. She is more focused on sore throat and continued ear pain.    Allergies  Allergen Reactions  . Other Anaphylaxis    Seafood    . Amoxicillin Rash  . Amoxicillin Rash  . Shellfish-Derived Products Rash and Swelling   Social History  Substance Use Topics  . Smoking status: Never Smoker   . Smokeless tobacco: Not on file  . Alcohol Use: No     Comment: occasional   Past Medical History  Diagnosis Date  . Allergy   . Asthma   . Arthritis   . HV (hallux valgus)     repaired as child   Past Surgical History  Procedure Laterality Date  . Bunionectomy Right     performed as child   Family History  Problem Relation Age of Onset  . Hypertension Mother   . Alcohol abuse Father   . Glaucoma Maternal Grandmother   . Asthma Maternal Grandfather   . Cancer Paternal Grandfather     lung     Medication List       This list is accurate as of: 06/16/15  2:15 PM.  Always use your most recent med list.               fexofenadine 30 MG tablet  Commonly known as:  ALLEGRA  Take 30 mg by mouth 2 (two) times daily. Reported on 06/16/2015     fluticasone 50 MCG/ACT nasal spray  Commonly known as:  FLONASE  Place 2 sprays into both nostrils daily.     loratadine-pseudoephedrine 10-240 MG 24 hr tablet  Commonly known as:  CLARITIN-D 24-hour  Take 1 tablet by mouth  daily. Reported on 06/16/2015         ROS: Negative, with the exception of above mentioned in HPI  Objective:  BP 125/85 mmHg  Pulse 66  Temp(Src) 97.8 F (36.6 C)  Resp 20  Ht 5\' 4"  (1.626 m)  Wt 159 lb 12 oz (72.462 kg)  BMI 27.41 kg/m2  SpO2 100% Body mass index is 27.41 kg/(m^2). Gen: Afebrile. No acute distress. Nontoxic in appearance. HENT: AT. [place]. Bilateral TM visualized with no erythema or bulging. Air fluid levels bilaterally. MMM, no oral lesions. Bilateral nares mild erythema, no swelling. Throat without erythema or exudates. No cough or hoarseness. No TTP facial sinus.  Eyes:Pupils Equal Round Reactive to light, Extraocular movements intact,  Conjunctiva without redness, discharge or icterus. Neck/lymp/endocrine: Supple,no lymphadenopathy CV: RRR  Chest: CTAB, no wheeze or crackles. Good air movement, normal resp effort.  Abd: Soft. NT. Mild epigstric tenderness. BS present. Neg Murphy's. Neg McBurney.   Assessment/Plan: Ruth [name] is a 45 y.o. female present for acute OV for multiple complaints.  Allergic rhinism/sore throat/GERD/diarrhea: Right ear pain--> likely allergy related, continue Flonase and antihistamine. Continues despite allergy therapy. Will refer to ENT for second opinion.  - Ambulatory  referral to ENT  Gastroesophageal reflux disease, esophagitis presence not specified - start PPI  - MUST start GERD diet, and monitor for particular triggers.  - AVOID NSAIDS - omeprazole (PRILOSEC) 20 MG capsule; Take 1 capsule (20 mg total) by mouth daily.  Dispense: 30 capsule; Refill: 3  Nausea and vomiting, intractability of vomiting not specified, unspecified vomiting type - gastritis type symptoms, that are resolving with vomiting.  - ondansetron (ZOFRAN) 4 MG tablet; Take 1 tablet (4 mg total) by mouth every 8 (eight) hours as needed for nausea or vomiting.  Dispense: 20 tablet; Refill: 0   F/U 8 weeks if not improved, sooner if worsening.  > 25  minutes spent with patient, >50% of time spent face to face counseling patient and coordinating care.  electronically signed by:  Howard Pouch, DO  [place]

## 2015-06-24 ENCOUNTER — Other Ambulatory Visit (INDEPENDENT_AMBULATORY_CARE_PROVIDER_SITE_OTHER): Payer: BC Managed Care – PPO

## 2015-06-24 DIAGNOSIS — Z130 Encounter for screening for diseases of the blood and blood-forming organs and certain disorders involving the immune mechanism: Secondary | ICD-10-CM | POA: Diagnosis not present

## 2015-06-24 DIAGNOSIS — Z0000 Encounter for general adult medical examination without abnormal findings: Secondary | ICD-10-CM

## 2015-06-24 DIAGNOSIS — Z1321 Encounter for screening for nutritional disorder: Secondary | ICD-10-CM

## 2015-06-24 DIAGNOSIS — Z13220 Encounter for screening for lipoid disorders: Secondary | ICD-10-CM | POA: Diagnosis not present

## 2015-06-24 DIAGNOSIS — Z1329 Encounter for screening for other suspected endocrine disorder: Secondary | ICD-10-CM

## 2015-06-24 LAB — CBC WITH DIFFERENTIAL/PLATELET
BASOS ABS: 0 10*3/uL (ref 0.0–0.1)
BASOS PCT: 0.7 % (ref 0.0–3.0)
EOS ABS: 0.3 10*3/uL (ref 0.0–0.7)
EOS PCT: 5.3 % — AB (ref 0.0–5.0)
HCT: 36 % (ref 36.0–46.0)
HEMOGLOBIN: 11.6 g/dL — AB (ref 12.0–15.0)
LYMPHS ABS: 2.1 10*3/uL (ref 0.7–4.0)
Lymphocytes Relative: 42.9 % (ref 12.0–46.0)
MCHC: 32.2 g/dL (ref 30.0–36.0)
MCV: 79.2 fl (ref 78.0–100.0)
MONO ABS: 0.5 10*3/uL (ref 0.1–1.0)
MONOS PCT: 11 % (ref 3.0–12.0)
NEUTROS PCT: 40.1 % — AB (ref 43.0–77.0)
Neutro Abs: 1.9 10*3/uL (ref 1.4–7.7)
Platelets: 376 10*3/uL (ref 150.0–400.0)
RBC: 4.54 Mil/uL (ref 3.87–5.11)
RDW: 15 % (ref 11.5–15.5)
WBC: 4.8 10*3/uL (ref 4.0–10.5)

## 2015-06-24 LAB — COMPREHENSIVE METABOLIC PANEL
ALK PHOS: 83 U/L (ref 39–117)
ALT: 13 U/L (ref 0–35)
AST: 16 U/L (ref 0–37)
Albumin: 4.3 g/dL (ref 3.5–5.2)
BUN: 20 mg/dL (ref 6–23)
CO2: 29 mEq/L (ref 19–32)
CREATININE: 0.96 mg/dL (ref 0.40–1.20)
Calcium: 9.8 mg/dL (ref 8.4–10.5)
Chloride: 103 mEq/L (ref 96–112)
GFR: 80.49 mL/min (ref >60.00)
GLUCOSE: 94 mg/dL (ref 70–99)
POTASSIUM: 4.5 meq/L (ref 3.5–5.1)
SODIUM: 139 meq/L (ref 135–145)
TOTAL PROTEIN: 7.3 g/dL (ref 6.0–8.3)
Total Bilirubin: 0.3 mg/dL (ref 0.2–1.2)

## 2015-06-24 LAB — LIPID PANEL
CHOL/HDL RATIO: 4
Cholesterol: 248 mg/dL — ABNORMAL HIGH (ref 0–200)
HDL: 58.6 mg/dL (ref >39.00)
LDL Cholesterol: 167 mg/dL — ABNORMAL HIGH (ref 0–99)
NONHDL: 189.58
Triglycerides: 115 mg/dL (ref 0.0–149.0)
VLDL: 23 mg/dL (ref 0.0–40.0)

## 2015-06-24 LAB — TSH: TSH: 1.12 u[IU]/mL (ref 0.35–4.50)

## 2015-06-24 LAB — VITAMIN D 25 HYDROXY (VIT D DEFICIENCY, FRACTURES): VITD: 37.23 ng/mL (ref 30.00–100.00)

## 2015-06-26 ENCOUNTER — Encounter: Payer: BC Managed Care – PPO | Admitting: Family Medicine

## 2015-06-30 ENCOUNTER — Encounter: Payer: Self-pay | Admitting: Family Medicine

## 2015-06-30 ENCOUNTER — Ambulatory Visit (INDEPENDENT_AMBULATORY_CARE_PROVIDER_SITE_OTHER): Payer: BC Managed Care – PPO | Admitting: Family Medicine

## 2015-06-30 VITALS — BP 117/80 | HR 63 | Temp 98.0°F | Resp 20 | Ht 64.0 in | Wt 159.8 lb

## 2015-06-30 DIAGNOSIS — Z0000 Encounter for general adult medical examination without abnormal findings: Secondary | ICD-10-CM | POA: Diagnosis not present

## 2015-06-30 DIAGNOSIS — E785 Hyperlipidemia, unspecified: Secondary | ICD-10-CM

## 2015-06-30 MED ORDER — ATORVASTATIN CALCIUM 20 MG PO TABS: 20.0000 mg | ORAL_TABLET | Freq: Every day | ORAL | Status: DC

## 2015-06-30 NOTE — Progress Notes (Signed)
Patient ID: MELANY WIESMAN, female   DOB: [birth year], 45 y.o.   MRN: [id number]      Patient ID: VELDA WENDT, female  DOB: 1970-12-15, 45 y.o.   MRN: [id number]  Subjective:  GWYNETH FERNANDEZ is a 45 y.o.  female present for CPE. All past medical history, surgical history, allergies, family history, immunizations, medications and social history were updated  in the electronic medical record today. All recent labs, ED visits and hospitalizations within the last year were reviewed.  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 She has knee pain about 2 years ago and was seen at [facility] She states she had an  MRI that showed some changes. She received a steroid injection. Over the past weekend she noticed it started to swell and became painful. She iced the area and took NSAIDS. It has improved with this treatment.   Hyperlipidmia: reviewed patients lab results with her today in detail. Her lipid panel has remained elevated and she is unable to tolerate fish oil secondary to fish allergy. Fhx of HTN. She already exercises routinely. She states she probably could change more in her diet, but really feels it would be difficult. She is no longer eating fast food.   Health maintenance:  Colonoscopy: routine screening at 63, No Fhx Mammogram: mammogram, gets them in the gyn office ([place]) Cervical cancer screening: 04/10/2014 per pt, "debbie" at [facility] and associated Immunizations: UTD tdap (2015), flu shot yearly.  Infectious disease screening: HIV completed DEXA: never Assistive device: none Oxygen use: none Patient has a Dental home. Hospitalizations/ED visits: none  Recent Results (from the past 2160  hour(s))  CBC w/Diff     Status: Abnormal   Collection Time: 06/24/15  8:07 AM  Result Value Ref Range   WBC 4.8 4.0 - 10.5 K/uL   RBC 4.54 3.87 - 5.11 Mil/uL   Hemoglobin 11.6 (L) 12.0 - 15.0 g/dL   HCT 36.0 36.0 - 46.0 %   MCV 79.2 78.0 - 100.0 fl   MCHC 32.2 30.0 - 36.0 g/dL   RDW 15.0 11.5 - 15.5 %   Platelets 376.0 150.0 - 400.0 K/uL   Neutrophils Relative % 40.1 (L) 43.0 - 77.0 %   Lymphocytes Relative 42.9 12.0 - 46.0 %   Monocytes Relative 11.0 3.0 - 12.0 %   Eosinophils Relative 5.3 (H) 0.0 - 5.0 %   Basophils Relative 0.7 0.0 - 3.0 %   Neutro Abs 1.9 1.4 - 7.7 K/uL   Lymphs Abs 2.1 0.7 - 4.0 K/uL   Monocytes Absolute 0.5 0.1 - 1.0 K/uL   Eosinophils Absolute 0.3 0.0 - 0.7 K/uL   Basophils Absolute 0.0 0.0 - 0.1 K/uL  Comp Met (CMET)     Status: None   Collection Time: 06/24/15  8:07 AM  Result Value Ref Range   Sodium 139 135 - 145 mEq/L   Potassium 4.5 3.5 - 5.1 mEq/L   Chloride 103 96 - 112 mEq/L   CO2 29 19 - 32 mEq/L   Glucose, Bld 94 70 - 99 mg/dL   BUN 20 6 - 23 mg/dL   Creatinine, Ser 0.96 0.40 - 1.20 mg/dL   Total Bilirubin 0.3 0.2 -  1.2 mg/dL   Alkaline Phosphatase 83 39 - 117 U/L   AST 16 0 - 37 U/L   ALT 13 0 - 35 U/L   Total Protein 7.3 6.0 - 8.3 g/dL   Albumin 4.3 3.5 - 5.2 g/dL   Calcium 9.8 8.4 - 10.5 mg/dL   GFR 80.49 >60.00 mL/min  TSH     Status: None   Collection Time: 06/24/15  8:07 AM  Result Value Ref Range   TSH 1.12 0.35 - 4.50 uIU/mL  Lipid Profile     Status: Abnormal   Collection Time: 06/24/15  8:07 AM  Result Value Ref Range   Cholesterol 248 (H) 0 - 200 mg/dL    Comment: ATP III Classification       Desirable:  < 200 mg/dL               Borderline High:  200 - 239 mg/dL          High:  > = 240 mg/dL   Triglycerides 115.0 0.0 - 149.0 mg/dL    Comment: Normal:  <150 mg/dLBorderline High:  150 - 199 mg/dL   HDL 58.60 >39.00 mg/dL   VLDL 23.0 0.0 - 40.0 mg/dL   LDL Cholesterol 167 (H) 0 - 99 mg/dL   Total CHOL/HDL Ratio 4      Comment:                Men          Women1/2 Average Risk     3.4          3.3Average Risk          5.0          4.42X Average Risk          9.6          7.13X Average Risk          15.0          11.0                       NonHDL 189.58     Comment: NOTE:  Non-HDL goal should be 30 mg/dL higher than patient's LDL goal (i.e. LDL goal of < 70 mg/dL, would have non-HDL goal of < 100 mg/dL)  Vitamin D (25 hydroxy)     Status: None   Collection Time: 06/24/15  8:07 AM  Result Value Ref Range   VITD 37.23 30.00 - 100.00 ng/mL     Depression screen PHQ 2/9 06/30/2015  Decreased Interest 0  Down, Depressed, Hopeless 0  PHQ - 2 Score 0    Current Exercise Habits: Home exercise routine, Type of exercise: walking, Time (Minutes): 30, Frequency (Times/Week): 4, Weekly Exercise (Minutes/Week): 120, Intensity: Mild     Past Medical History  Diagnosis Date  . Allergy   . Asthma   . Arthritis   . HV (hallux valgus)     repaired as child   Allergies  Allergen Reactions  . Other Anaphylaxis    Seafood    . Amoxicillin Rash  . Amoxicillin Rash  . Shellfish-Derived Products Rash and Swelling   Past Surgical History  Procedure Laterality Date  . Bunionectomy Right     performed as child   Family History  Problem Relation Age of Onset  . Hypertension Mother   . Alcohol abuse Father   . Glaucoma Maternal Grandmother   . Asthma Maternal Grandfather   . Cancer Paternal Grandfather  lung   Social History   Social History  . Marital Status: Single    Spouse Name: N/A  . Number of Children: 0  . Years of Education: N/A   Occupational History  . teacher     HS, basketball coach   Social History Main Topics  . Smoking status: Never Smoker   . Smokeless tobacco: Not on file  . Alcohol Use: No     Comment: occasional  . Drug Use: No  . Sexual Activity: Not on file   Other Topics Concern  . Not on file   Social History Narrative   ** Merged History Encounter **       Ms  Hartl works FT as Pharmacist, hospital. She lives alone.      ROS: 14 pt review of systems performed and negative (unless mentioned in an HPI)  Objective: BP 117/80 mmHg  Pulse 63  Temp(Src) 98 F (36.7 C)  Resp 20  Ht 5' 4" (1.626 m)  Wt 159 lb 12 oz (72.462 kg)  BMI 27.41 kg/m2  SpO2 100% Gen: Afebrile. No acute distress. Nontoxic in appearance, well-developed, well-nourished, female, AAF, very pleasant.  HENT: AT. [place]. Bilateral TM visualized and normal in appearance, normal external auditory canal. MMM, no oral lesions, good dentition. Bilateral nares no erythema or swelling. Throat without erythema, ulcerations or exudates. no Cough on exam, no hoarseness on exam. Eyes:Pupils Equal Round Reactive to light, Extraocular movements intact,  Conjunctiva without redness, discharge or icterus. Neck/lymp/endocrine: Supple,no lymphadenopathy, no thyromegaly CV: RRR no murmur, no edema, +2/4 P posterior tibialis pulses.  Chest: CTAB, no wheeze, rhonchi or crackles. normal Respiratory effort. good Air movement. Abd: Soft. round. NTND. BS present. no Masses palpated. No hepatosplenomegaly. No rebound tenderness or guarding. Skin: no rashes, purpura or petechiae. Warm and well-perfused. Skin intact. Neuro/Msk:  Normal gait. PERLA. EOMi. Alert. Oriented x3.  Cranial nerves II through XII intact. Muscle strength 5/5 U/L extremity. DTRs equal bilaterally.  Psych: Normal affect, dress and demeanor. Normal speech. Normal thought content and judgment.  Assessment/plan: EVANGELA HEFFLER is a 45 y.o. female present for annual exam.  Hyperlipidemia LDL goal <130 - Pt agreeable to starting statin medication.  - Diet and exercise modifications discussed and encouraged.  - atorvastatin (LIPITOR) 20 MG tablet; Take 1 tablet (20 mg total) by mouth daily.  Dispense: 30 tablet; Refill: 5 - F/U 4 months with repeat fasting labs and cmp.   Encounter for preventive health examination - discussed all labs with pt  today. Mild anemia is chronic. Encouraged iron rich foods/daily vitamin.  - Statin started - UTD with immunizations/screenings - pt encouraged to make gyn appt for pap and mammogram. Patient was encouraged to exercise greater than 150 minutes a week. Patient was encouraged to choose a diet filled with fresh fruits and vegetables, and lean meats. AVS provided to patient today for education/recommendation on gender specific health and safety maintenance.  Rt knee pain is improved, pt just wanted provider aware the issueis reoccurring in the event she may need to be seen.   Return in about 4 months (around 10/30/2015) for hyperlipidemia.  Electronically signed by: Howard Pouch, DO [place]

## 2015-06-30 NOTE — Patient Instructions (Addendum)
Continue Vitamin D daily Try daily vitamin and/or iron supplement. Cholesterol is elevated we will start a low dose statin medication and you continue diet and exercise. Recheck in 3-6 months.  Health Maintenance, Female Adopting a healthy lifestyle and getting preventive care can go a long way to promote health and wellness. Talk with your health care provider about what schedule of regular examinations is right for you. This is a good chance for you to check in with your provider about disease prevention and staying healthy. In between checkups, there are plenty of things you can do on your own. Experts have done a lot of research about which lifestyle changes and preventive measures are most likely to keep you healthy. Ask your health care provider for more information. WEIGHT AND DIET  Eat a healthy diet  Be sure to include plenty of vegetables, fruits, low-fat dairy products, and lean protein.  Do not eat a lot of foods high in solid fats, added sugars, or salt.  Get regular exercise. This is one of the most important things you can do for your health.  Most adults should exercise for at least 150 minutes each week. The exercise should increase your heart rate and make you sweat (moderate-intensity exercise).  Most adults should also do strengthening exercises at least twice a week. This is in addition to the moderate-intensity exercise.  Maintain a healthy weight  Body mass index (BMI) is a measurement that can be used to identify possible weight problems. It estimates body fat based on height and weight. Your health care provider can help determine your BMI and help you achieve or maintain a healthy weight.  For females 37 years of age and older:   A BMI below 18.5 is considered underweight.  A BMI of 18.5 to 24.9 is normal.  A BMI of 25 to 29.9 is considered overweight.  A BMI of 30 and above is considered obese.  Watch levels of cholesterol and blood lipids  You should  start having your blood tested for lipids and cholesterol at 46 years of age, then have this test every 5 years.  You may need to have your cholesterol levels checked more often if:  Your lipid or cholesterol levels are high.  You are older than 46 years of age.  You are at high risk for heart disease.  CANCER SCREENING   Lung Cancer  Lung cancer screening is recommended for adults 41-8 years old who are at high risk for lung cancer because of a history of smoking.  A yearly low-dose CT scan of the lungs is recommended for people who:  Currently smoke.  Have quit within the past 15 years.  Have at least a 30-pack-year history of smoking. A pack year is smoking an average of one pack of cigarettes a day for 1 year.  Yearly screening should continue until it has been 15 years since you quit.  Yearly screening should stop if you develop a health problem that would prevent you from having lung cancer treatment.  Breast Cancer  Practice breast self-awareness. This means understanding how your breasts normally appear and feel.  It also means doing regular breast self-exams. Let your health care provider know about any changes, no matter how small.  If you are in your 20s or 30s, you should have a clinical breast exam (CBE) by a health care provider every 1-3 years as part of a regular health exam.  If you are 4 or older, have a CBE every  year. Also consider having a breast X-ray (mammogram) every year.  If you have a family history of breast cancer, talk to your health care provider about genetic screening.  If you are at high risk for breast cancer, talk to your health care provider about having an MRI and a mammogram every year.  Breast cancer gene (BRCA) assessment is recommended for women who have family members with BRCA-related cancers. BRCA-related cancers include:  Breast.  Ovarian.  Tubal.  Peritoneal cancers.  Results of the assessment will determine the  need for genetic counseling and BRCA1 and BRCA2 testing. Cervical Cancer Your health care provider may recommend that you be screened regularly for cancer of the pelvic organs (ovaries, uterus, and vagina). This screening involves a pelvic examination, including checking for microscopic changes to the surface of your cervix (Pap test). You may be encouraged to have this screening done every 3 years, beginning at age 73.  For women ages 56-65, health care providers may recommend pelvic exams and Pap testing every 3 years, or they may recommend the Pap and pelvic exam, combined with testing for human papilloma virus (HPV), every 5 years. Some types of HPV increase your risk of cervical cancer. Testing for HPV may also be done on women of any age with unclear Pap test results.  Other health care providers may not recommend any screening for nonpregnant women who are considered low risk for pelvic cancer and who do not have symptoms. Ask your health care provider if a screening pelvic exam is right for you.  If you have had past treatment for cervical cancer or a condition that could lead to cancer, you need Pap tests and screening for cancer for at least 20 years after your treatment. If Pap tests have been discontinued, your risk factors (such as having a new sexual partner) need to be reassessed to determine if screening should resume. Some women have medical problems that increase the chance of getting cervical cancer. In these cases, your health care provider may recommend more frequent screening and Pap tests. Colorectal Cancer  This type of cancer can be detected and often prevented.  Routine colorectal cancer screening usually begins at 46 years of age and continues through 46 years of age.  Your health care provider may recommend screening at an earlier age if you have risk factors for colon cancer.  Your health care provider may also recommend using home test kits to check for hidden blood in  the stool.  A small camera at the end of a tube can be used to examine your colon directly (sigmoidoscopy or colonoscopy). This is done to check for the earliest forms of colorectal cancer.  Routine screening usually begins at age 22.  Direct examination of the colon should be repeated every 5-10 years through 46 years of age. However, you may need to be screened more often if early forms of precancerous polyps or small growths are found. Skin Cancer  Check your skin from head to toe regularly.  Tell your health care provider about any new moles or changes in moles, especially if there is a change in a mole's shape or color.  Also tell your health care provider if you have a mole that is larger than the size of a pencil eraser.  Always use sunscreen. Apply sunscreen liberally and repeatedly throughout the day.  Protect yourself by wearing long sleeves, pants, a wide-brimmed hat, and sunglasses whenever you are outside. HEART DISEASE, DIABETES, AND HIGH BLOOD PRESSURE  High blood pressure causes heart disease and increases the risk of stroke. High blood pressure is more likely to develop in:  People who have blood pressure in the high end of the normal range (130-139/85-89 mm Hg).  People who are overweight or obese.  People who are African American.  If you are 48-2 years of age, have your blood pressure checked every 3-5 years. If you are 12 years of age or older, have your blood pressure checked every year. You should have your blood pressure measured twice--once when you are at a hospital or clinic, and once when you are not at a hospital or clinic. Record the average of the two measurements. To check your blood pressure when you are not at a hospital or clinic, you can use:  An automated blood pressure machine at a pharmacy.  A home blood pressure monitor.  If you are between 94 years and 73 years old, ask your health care provider if you should take aspirin to prevent  strokes.  Have regular diabetes screenings. This involves taking a blood sample to check your fasting blood sugar level.  If you are at a normal weight and have a low risk for diabetes, have this test once every three years after 46 years of age.  If you are overweight and have a high risk for diabetes, consider being tested at a younger age or more often. PREVENTING INFECTION  Hepatitis B  If you have a higher risk for hepatitis B, you should be screened for this virus. You are considered at high risk for hepatitis B if:  You were born in a country where hepatitis B is common. Ask your health care provider which countries are considered high risk.  Your parents were born in a high-risk country, and you have not been immunized against hepatitis B (hepatitis B vaccine).  You have HIV or AIDS.  You use needles to inject street drugs.  You live with someone who has hepatitis B.  You have had sex with someone who has hepatitis B.  You get hemodialysis treatment.  You take certain medicines for conditions, including cancer, organ transplantation, and autoimmune conditions. Hepatitis C  Blood testing is recommended for:  Everyone born from [birth year] through 1966.  Anyone with known risk factors for hepatitis C. Sexually transmitted infections (STIs)  You should be screened for sexually transmitted infections (STIs) including gonorrhea and chlamydia if:  You are sexually active and are younger than 46 years of age.  You are older than 46 years of age and your health care provider tells you that you are at risk for this type of infection.  Your sexual activity has changed since you were last screened and you are at an increased risk for chlamydia or gonorrhea. Ask your health care provider if you are at risk.  If you do not have HIV, but are at risk, it may be recommended that you take a prescription medicine daily to prevent HIV infection. This is called pre-exposure prophylaxis  (PrEP). You are considered at risk if:  You are sexually active and do not regularly use condoms or know the HIV status of your partner(s).  You take drugs by injection.  You are sexually active with a partner who has HIV. Talk with your health care provider about whether you are at high risk of being infected with HIV. If you choose to begin PrEP, you should first be tested for HIV. You should then be tested every 3 months for as  long as you are taking PrEP.  PREGNANCY   If you are premenopausal and you may become pregnant, ask your health care provider about preconception counseling.  If you may become pregnant, take 400 to 800 micrograms (mcg) of folic acid every day.  If you want to prevent pregnancy, talk to your health care provider about birth control (contraception). OSTEOPOROSIS AND MENOPAUSE   Osteoporosis is a disease in which the bones lose minerals and strength with aging. This can result in serious bone fractures. Your risk for osteoporosis can be identified using a bone density scan.  If you are 48 years of age or older, or if you are at risk for osteoporosis and fractures, ask your health care provider if you should be screened.  Ask your health care provider whether you should take a calcium or vitamin D supplement to lower your risk for osteoporosis.  Menopause may have certain physical symptoms and risks.  Hormone replacement therapy may reduce some of these symptoms and risks. Talk to your health care provider about whether hormone replacement therapy is right for you.  HOME CARE INSTRUCTIONS   Schedule regular health, dental, and eye exams.  Stay current with your immunizations.   Do not use any tobacco products including cigarettes, chewing tobacco, or electronic cigarettes.  If you are pregnant, do not drink alcohol.  If you are breastfeeding, limit how much and how often you drink alcohol.  Limit alcohol intake to no more than 1 drink per day for  nonpregnant women. One drink equals 12 ounces of beer, 5 ounces of wine, or 1 ounces of hard liquor.  Do not use street drugs.  Do not share needles.  Ask your health care provider for help if you need support or information about quitting drugs.  Tell your health care provider if you often feel depressed.  Tell your health care provider if you have ever been abused or do not feel safe at home.   This information is not intended to replace advice given to you by your health care provider. Make sure you discuss any questions you have with your health care provider.   Document Released: 07/06/2010 Document Revised: 01/11/2014 Document Reviewed: 11/22/2012 Elsevier Interactive Patient Education 2016 [employer]  Fat and Cholesterol Restricted Diet High levels of fat and cholesterol in your blood may lead to various health problems, such as diseases of the heart, blood vessels, gallbladder, liver, and pancreas. Fats are concentrated sources of energy that come in various forms. Certain types of fat, including saturated fat, may be harmful in excess. Cholesterol is a substance needed by your body in small amounts. Your body makes all the cholesterol it needs. Excess cholesterol comes from the food you eat. When you have high levels of cholesterol and saturated fat in your blood, health problems can develop because the excess fat and cholesterol will gather along the walls of your blood vessels, causing them to narrow. Choosing the right foods will help you control your intake of fat and cholesterol. This will help keep the levels of these substances in your blood within normal limits and reduce your risk of disease. WHAT IS MY PLAN? Your health care provider recommends that you:  Get no more than __________ % of the total calories in your daily diet from fat.  Limit your intake of saturated fat to less than ______% of your total calories each day.  Limit the amount of cholesterol in your  diet to less than _________mg per day. WHAT  TYPES OF FAT SHOULD I CHOOSE?  Choose healthy fats more often. Choose monounsaturated and polyunsaturated fats, such as olive and canola oil, flaxseeds, walnuts, almonds, and seeds.  Eat more omega-3 fats. Good choices include salmon, mackerel, sardines, tuna, flaxseed oil, and ground flaxseeds. Aim to eat fish at least two times a week.  Limit saturated fats. Saturated fats are primarily found in animal products, such as meats, butter, and cream. Plant sources of saturated fats include palm oil, palm kernel oil, and coconut oil.  Avoid foods with partially hydrogenated oils in them. These contain trans fats. Examples of foods that contain trans fats are stick margarine, some tub margarines, cookies, crackers, and other baked goods. WHAT GENERAL GUIDELINES DO I NEED TO FOLLOW? These guidelines for healthy eating will help you control your intake of fat and cholesterol:  Check food labels carefully to identify foods with trans fats or high amounts of saturated fat.  Fill one half of your plate with vegetables and green salads.  Fill one fourth of your plate with whole grains. Look for the word "whole" as the first word in the ingredient list.  Fill one fourth of your plate with lean protein foods.  Limit fruit to two servings a day. Choose fruit instead of juice.  Eat more foods that contain soluble fiber. Examples of foods that contain this type of fiber are apples, broccoli, carrots, beans, peas, and barley. Aim to get 20-30 g of fiber per day.  Eat more home-cooked food and less restaurant, buffet, and fast food.  Limit or avoid alcohol.  Limit foods high in starch and sugar.  Limit fried foods.  Cook foods using methods other than frying. Baking, boiling, grilling, and broiling are all great options.  Lose weight if you are overweight. Losing just 5-10% of your initial body weight can help your overall health and prevent diseases such  as diabetes and heart disease. WHAT FOODS CAN I EAT? Grains Whole grains, such as whole wheat or whole grain breads, crackers, cereals, and pasta. Unsweetened oatmeal, bulgur, barley, quinoa, or brown rice. Corn or whole wheat flour tortillas. Vegetables Fresh or frozen vegetables (raw, steamed, roasted, or grilled). Green salads. Fruits All fresh, canned (in natural juice), or frozen fruits. Meat and Other Protein Products Ground beef (85% or leaner), grass-fed beef, or beef trimmed of fat. Skinless chicken or [place]. Ground chicken or [place]. Pork trimmed of fat. All fish and seafood. Eggs. Dried beans, peas, or lentils. Unsalted nuts or seeds. Unsalted canned or dry beans. Dairy Low-fat dairy products, such as skim or 1% milk, 2% or reduced-fat cheeses, low-fat ricotta or cottage cheese, or plain low-fat yogurt. Fats and Oils Tub margarines without trans fats. Light or reduced-fat mayonnaise and salad dressings. Avocado. Olive, canola, sesame, or safflower oils. Natural peanut or almond butter (choose ones without added sugar and oil). The items listed above may not be a complete list of recommended foods or beverages. Contact your dietitian for more options. WHAT FOODS ARE NOT RECOMMENDED? Grains White bread. White pasta. White rice. Cornbread. Bagels, pastries, and croissants. Crackers that contain trans fat. Vegetables White potatoes. Corn. Creamed or fried vegetables. Vegetables in a cheese sauce. Fruits Dried fruits. Canned fruit in light or heavy syrup. Fruit juice. Meat and Other Protein Products Fatty cuts of meat. Ribs, chicken wings, bacon, sausage, bologna, salami, chitterlings, fatback, hot dogs, bratwurst, and packaged luncheon meats. Liver and organ meats. Dairy Whole or 2% milk, cream, half-and-half, and cream cheese. Whole milk cheeses. Whole-fat  or sweetened yogurt. Full-fat cheeses. Nondairy creamers and whipped toppings. Processed cheese, cheese spreads, or cheese  curds. Sweets and Desserts Corn syrup, sugars, honey, and molasses. Candy. Jam and jelly. Syrup. Sweetened cereals. Cookies, pies, cakes, donuts, muffins, and ice cream. Fats and Oils Butter, stick margarine, lard, shortening, ghee, or bacon fat. Coconut, palm kernel, or palm oils. Beverages Alcohol. Sweetened drinks (such as sodas, lemonade, and fruit drinks or punches). The items listed above may not be a complete list of foods and beverages to avoid. Contact your dietitian for more information.   This information is not intended to replace advice given to you by your health care provider. Make sure you discuss any questions you have with your health care provider.   Document Released: 12/21/2004 Document Revised: 01/11/2014 Document Reviewed: 03/21/2013 Elsevier Interactive Patient Education 2016 [employer].   Iron-Rich Diet Iron is a mineral that helps your body to produce hemoglobin. Hemoglobin is a protein in your red blood cells that carries oxygen to your body's tissues. Eating too little iron may cause you to feel weak and tired, and it can increase your risk for infection. Eating enough iron is necessary for your body's metabolism, muscle function, and nervous system. Iron is naturally found in many foods. It can also be added to foods or fortified in foods. There are two types of dietary iron:  Heme iron. Heme iron is absorbed by the body more easily than nonheme iron. Heme iron is found in meat, poultry, and fish.  Nonheme iron. Nonheme iron is found in dietary supplements, iron-fortified grains, beans, and vegetables. You may need to follow an iron-rich diet if:  You have been diagnosed with iron deficiency or iron-deficiency anemia.  You have a condition that prevents you from absorbing dietary iron, such as:  Infection in your intestines.  Celiac disease. This involves long-lasting (chronic) inflammation of your intestines.  You do not eat enough iron.  You eat a  diet that is high in foods that impair iron absorption.  You have lost a lot of blood.  You have heavy bleeding during your menstrual cycle.  You are pregnant. WHAT IS MY PLAN? Your health care provider may help you to determine how much iron you need per day based on your condition. Generally, when a person consumes sufficient amounts of iron in the diet, the following iron needs are met:  Men.  68-15 years old: 11 mg per day.  8-76 years old: 8 mg per day.  Women.   55-75 years old: 15 mg per day.  10-47 years old: 18 mg per day.  Over 50 years old: 8 mg per day.  Pregnant women: 27 mg per day.  Breastfeeding women: 9 mg per day. WHAT DO I NEED TO KNOW ABOUT AN IRON-RICH DIET?  Eat fresh fruits and vegetables that are high in vitamin C along with foods that are high in iron. This will help increase the amount of iron that your body absorbs from food, especially with foods containing nonheme iron. Foods that are high in vitamin C include oranges, peppers, tomatoes, and mango.  Take iron supplements only as directed by your health care provider. Overdose of iron can be life-threatening. If you were prescribed iron supplements, take them with orange juice or a vitamin C supplement.  Cook foods in pots and pans that are made from iron.   Eat nonheme iron-containing foods alongside foods that are high in heme iron. This helps to improve your iron absorption.  Certain foods and drinks contain compounds that impair iron absorption. Avoid eating these foods in the same meal as iron-rich foods or with iron supplements. These include:  Coffee, black tea, and red wine.  Milk, dairy products, and foods that are high in calcium.  Beans, soybeans, and peas.  Whole grains.  When eating foods that contain both nonheme iron and compounds that impair iron absorption, follow these tips to absorb iron better.   Soak beans overnight before cooking.  Soak whole grains overnight  and drain them before using.  Ferment flours before baking, such as using yeast in bread dough. WHAT FOODS CAN I EAT? Grains Iron-fortified breakfast cereal. Iron-fortified whole-wheat bread. Enriched rice. Sprouted grains. Vegetables Spinach. Potatoes with skin. Green peas. Broccoli. Red and green bell peppers. Fermented vegetables. Fruits Prunes. Raisins. Oranges. Strawberries. Mango. Grapefruit. Meats and Other Protein Sources Beef liver. Oysters. Beef. Shrimp. [place]. Chicken. [place]. Sardines. Chickpeas. Nuts. Tofu. Beverages Tomato juice. Fresh orange juice. Prune juice. Hibiscus tea. Fortified instant breakfast shakes. Condiments Tahini. Fermented soy sauce. Sweets and Desserts Black-strap molasses.  Other Wheat germ. This information is not intended to replace advice given to you by your health care provider. Make sure you discuss any questions you have with your health care provider.   Document Released: 08/04/2004 Document Revised: 01/11/2014 Document Reviewed: 07/18/2013 Elsevier Interactive Patient Education [employer].

## 2015-09-19 ENCOUNTER — Ambulatory Visit (INDEPENDENT_AMBULATORY_CARE_PROVIDER_SITE_OTHER): Payer: Self-pay | Admitting: Family Medicine

## 2015-09-19 ENCOUNTER — Encounter: Payer: Self-pay | Admitting: Family Medicine

## 2015-09-19 VITALS — BP 120/81 | HR 81 | Temp 97.5°F | Resp 17 | Wt 160.8 lb

## 2015-09-19 DIAGNOSIS — S29012A Strain of muscle and tendon of back wall of thorax, initial encounter: Secondary | ICD-10-CM

## 2015-09-19 DIAGNOSIS — S39012A Strain of muscle, fascia and tendon of lower back, initial encounter: Secondary | ICD-10-CM

## 2015-09-19 DIAGNOSIS — S161XXA Strain of muscle, fascia and tendon at neck level, initial encounter: Secondary | ICD-10-CM

## 2015-09-19 MED ORDER — CYCLOBENZAPRINE HCL 10 MG PO TABS: 10.0000 mg | ORAL_TABLET | Freq: Three times a day (TID) | ORAL | 0 refills | Status: DC | PRN

## 2015-09-19 NOTE — Patient Instructions (Signed)
Take ibuprofen 600 mg twice a day for 10d (with food).
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
## 2015-09-19 NOTE — Progress Notes (Signed)
Pre visit review using our clinic review tool, if applicable. No additional management support is needed unless otherwise documented below in the visit note. 

## 2015-10-27 ENCOUNTER — Encounter: Payer: Self-pay | Admitting: Family Medicine

## 2015-10-27 ENCOUNTER — Other Ambulatory Visit: Payer: BC Managed Care – PPO

## 2015-10-27 ENCOUNTER — Other Ambulatory Visit (INDEPENDENT_AMBULATORY_CARE_PROVIDER_SITE_OTHER): Payer: BC Managed Care – PPO

## 2015-10-27 DIAGNOSIS — E785 Hyperlipidemia, unspecified: Secondary | ICD-10-CM | POA: Diagnosis not present

## 2015-10-28 LAB — COMPREHENSIVE METABOLIC PANEL
ALBUMIN: 4.3 g/dL (ref 3.5–5.2)
ALK PHOS: 85 U/L (ref 39–117)
ALT: 17 U/L (ref 0–35)
AST: 17 U/L (ref 0–37)
BILIRUBIN TOTAL: 0.3 mg/dL (ref 0.2–1.2)
BUN: 14 mg/dL (ref 6–23)
CALCIUM: 9.6 mg/dL (ref 8.4–10.5)
CO2: 26 mEq/L (ref 19–32)
Chloride: 106 mEq/L (ref 96–112)
Creatinine, Ser: 0.79 mg/dL (ref 0.40–1.20)
GFR: 100.64 mL/min (ref >60.00)
GLUCOSE: 87 mg/dL (ref 70–99)
Potassium: 3.9 mEq/L (ref 3.5–5.1)
Sodium: 140 mEq/L (ref 135–145)
TOTAL PROTEIN: 6.7 g/dL (ref 6.0–8.3)

## 2015-10-28 LAB — LIPID PANEL
CHOLESTEROL: 138 mg/dL (ref 0–200)
HDL: 59.6 mg/dL (ref >39.00)
LDL Cholesterol: 67 mg/dL (ref 0–99)
NONHDL: 78.58
TRIGLYCERIDES: 56 mg/dL (ref 0.0–149.0)
Total CHOL/HDL Ratio: 2
VLDL: 11.2 mg/dL (ref 0.0–40.0)

## 2015-10-30 ENCOUNTER — Ambulatory Visit: Payer: BC Managed Care – PPO | Admitting: Family Medicine

## 2015-11-04 ENCOUNTER — Telehealth: Payer: Self-pay | Admitting: Family Medicine

## 2015-11-04 DIAGNOSIS — E785 Hyperlipidemia, unspecified: Secondary | ICD-10-CM

## 2015-11-04 MED ORDER — ATORVASTATIN CALCIUM 20 MG PO TABS: 20.0000 mg | ORAL_TABLET | Freq: Every day | ORAL | 3 refills | Status: DC

## 2015-11-04 NOTE — Telephone Encounter (Signed)
Please call pt: - she has missed her appt last Thursday to review her labs. As long as she is doing well, I do not need her to reschedule. Her lipids are excellent and responding well to medication. I have called in refills (90d).    Lipid Panel     Component Value Date/Time   CHOL 138 10/27/2015 1602   TRIG 56.0 10/27/2015 1602   HDL 59.60 10/27/2015 1602   CHOLHDL 2 10/27/2015 1602   VLDL 11.2 10/27/2015 1602   LDLCALC 67 10/27/2015 1602

## 2015-11-05 NOTE — Telephone Encounter (Signed)
Left message with lab results and instructions on patient voice mail per DPR 

## 2016-03-30 ENCOUNTER — Telehealth: Payer: Self-pay | Admitting: Surgical

## 2016-03-30 NOTE — Telephone Encounter (Signed)
Left message for patient to return call to discuss symptoms. She is seeing Dr. Juleen China 03/31/16 at 8:15. Note said that she is having arm and hand tingling. Dr. Juleen China would like to discuss her symptoms to rule out any cardiac symptoms. Please ask to see if she is having any dizziness, headaches, neck or back pain. Also if this is a new or chronic issue.

## 2016-03-30 NOTE — Telephone Encounter (Signed)
Spoke to patient. Patient stated her tingling has been on and off x2weeks. The past 3 days the tingling has caused her to wake up in the middle of the night. Patient denied chest tightness and chest pressure. Patient stated to have SHOB at times. Patient states to feel achy like having a cold although she does not have a cold. Patient states she had a shingles - small patch on her back in 2003.   Dr. Juleen China aware of patient's status. Patient unable to come in for appointment this afternoon and is scheduled tomorrow morning at 8:15am.

## 2016-03-31 ENCOUNTER — Encounter: Payer: Self-pay | Admitting: Family Medicine

## 2016-03-31 ENCOUNTER — Ambulatory Visit (INDEPENDENT_AMBULATORY_CARE_PROVIDER_SITE_OTHER): Payer: BC Managed Care – PPO | Admitting: Family Medicine

## 2016-03-31 VITALS — BP 128/72 | HR 68 | Temp 97.8°F | Ht 64.0 in | Wt 172.8 lb

## 2016-03-31 DIAGNOSIS — R202 Paresthesia of skin: Secondary | ICD-10-CM | POA: Diagnosis not present

## 2016-03-31 DIAGNOSIS — N921 Excessive and frequent menstruation with irregular cycle: Secondary | ICD-10-CM

## 2016-03-31 LAB — CBC
HCT: 37.5 % (ref 36.0–46.0)
Hemoglobin: 12 g/dL (ref 12.0–15.0)
MCHC: 32 g/dL (ref 30.0–36.0)
MCV: 81.6 fl (ref 78.0–100.0)
Platelets: 282 10*3/uL (ref 150.0–400.0)
RBC: 4.59 Mil/uL (ref 3.87–5.11)
RDW: 14.2 % (ref 11.5–15.5)
WBC: 5.1 10*3/uL (ref 4.0–10.5)

## 2016-03-31 LAB — COMPREHENSIVE METABOLIC PANEL
ALT: 19 U/L (ref 0–35)
AST: 23 U/L (ref 0–37)
Albumin: 4.1 g/dL (ref 3.5–5.2)
Alkaline Phosphatase: 90 U/L (ref 39–117)
BUN: 9 mg/dL (ref 6–23)
CO2: 28 mEq/L (ref 19–32)
Calcium: 9.1 mg/dL (ref 8.4–10.5)
Chloride: 103 mEq/L (ref 96–112)
Creatinine, Ser: 0.87 mg/dL (ref 0.40–1.20)
GFR: 89.87 mL/min (ref >60.00)
Glucose, Bld: 91 mg/dL (ref 70–99)
Potassium: 3.9 mEq/L (ref 3.5–5.1)
Sodium: 138 mEq/L (ref 135–145)
Total Bilirubin: 0.2 mg/dL (ref 0.2–1.2)
Total Protein: 7 g/dL (ref 6.0–8.3)

## 2016-03-31 LAB — VITAMIN B12: Vitamin B-12: 433 pg/mL (ref 211–911)

## 2016-03-31 LAB — TSH: TSH: 1.48 u[IU]/mL (ref 0.35–4.50)

## 2016-03-31 NOTE — Progress Notes (Signed)
Ruth [name] is a 46 y.o. female here for a new problem.  History of Present Illness:   Water quality scientist, CMA, acting as scribe for Dr. Juleen China.  Chief Complaint  Patient presents with  . Acute Visit  . Tingling   CC:  Patient presents with bilateral hand numbness.  States this has been occurring off and on for 2-3 weeks but has become constant over the last 3 days.  When she wakes up in the morning her hands feel somewhat numb.  She has not tried anything for this.    Wrist Pain   The pain is present in the right wrist, right hand, left hand and left wrist. This is a chronic problem. The current episode started more than 1 month ago. There has been no history of extremity trauma. The problem occurs constantly. The problem has been gradually worsening. The quality of the pain is described as aching and burning. The pain is moderate. Associated symptoms include numbness and tingling. Pertinent negatives include no fever, itching, joint swelling or limited range of motion. The symptoms are aggravated by activity. She has tried NSAIDS and rest for the symptoms. The treatment provided mild relief. Her past medical history is significant for diabetes, osteoarthritis and rheumatoid arthritis.   PMHx, SurgHx, SocialHx, Medications, and Allergies were reviewed in the Visit Navigator and updated as appropriate.  Current Medications:   Current Outpatient Prescriptions:  .  omeprazole (PRILOSEC) 20 MG capsule, Take 1 capsule (20 mg total) by mouth daily., Disp: 30 capsule, Rfl: 3 .  atorvastatin (LIPITOR) 20 MG tablet, Take 1 tablet (20 mg total) by mouth daily. (Patient not taking: Reported on 03/31/2016), Disp: 90 tablet, Rfl: 3   Review of Systems:   Review of Systems  Constitutional: Negative for chills and fever.  Respiratory: Negative for cough and wheezing.   Cardiovascular: Negative for chest pain, palpitations and leg swelling.  Gastrointestinal: Negative for abdominal pain, constipation,  diarrhea, heartburn, nausea and vomiting.  Genitourinary: Negative for dysuria.  Musculoskeletal: Positive for myalgias. Negative for falls, joint pain and neck pain.  Skin: Negative for itching and rash.  Neurological: Positive for tingling and numbness. Negative for dizziness, focal weakness and headaches.  Psychiatric/Behavioral: Negative for depression. The patient is not nervous/anxious.     Vitals:   Vitals:   03/31/16 0827  BP: 128/72  Pulse: 68  Temp: 97.8 F (36.6 C)  TempSrc: Oral  SpO2: 97%  Weight: 172 lb 12.8 oz (78.4 kg)  Height: 5\' 4"  (1.626 m)     Body mass index is 29.66 kg/m.  Physical Exam:   Physical Exam  Constitutional: She appears well-nourished.  HENT:  Head: Normocephalic and atraumatic.  Eyes: EOM are normal. Pupils are equal, round, and reactive to light.  Neck: Normal range of motion. Neck supple.  Cardiovascular: Normal rate, regular rhythm, normal heart sounds and intact distal pulses.   Pulmonary/Chest: Effort normal.  Abdominal: Soft.  Musculoskeletal:  +Tinel, - Finkl[place]in bilateral wrists.  Skin: Skin is warm.  Psychiatric: She has a normal mood and affect. Her behavior is normal.  Nursing note and vitals reviewed.   Results for orders placed or performed in visit on 03/31/16  CBC  Result Value Ref Range   WBC 5.1 4.0 - 10.5 K/uL   RBC 4.59 3.87 - 5.11 Mil/uL   Platelets 282.0 150.0 - 400.0 K/uL   Hemoglobin 12.0 12.0 - 15.0 g/dL   HCT 37.5 36.0 - 46.0 %   MCV 81.6 78.0 - 100.0  fl   MCHC 32.0 30.0 - 36.0 g/dL   RDW 14.2 11.5 - 15.5 %  TSH  Result Value Ref Range   TSH 1.48 0.35 - 4.50 uIU/mL  Vitamin B12  Result Value Ref Range   Vitamin B-12 433 211 - 911 pg/mL  Comprehensive metabolic panel  Result Value Ref Range   Sodium 138 135 - 145 mEq/L   Potassium 3.9 3.5 - 5.1 mEq/L   Chloride 103 96 - 112 mEq/L   CO2 28 19 - 32 mEq/L   Glucose, Bld 91 70 - 99 mg/dL   BUN 9 6 - 23 mg/dL   Creatinine, Ser 0.87 0.40 - 1.20  mg/dL   Total Bilirubin 0.2 0.2 - 1.2 mg/dL   Alkaline Phosphatase 90 39 - 117 U/L   AST 23 0 - 37 U/L   ALT 19 0 - 35 U/L   Total Protein 7.0 6.0 - 8.3 g/dL   Albumin 4.1 3.5 - 5.2 g/dL   Calcium 9.1 8.4 - 10.5 mg/dL   GFR 89.87 >60.00 mL/min    Assessment and Plan:   [name] was seen today for acute visit and tingling.  Diagnoses and all orders for this visit:  Paresthesia of both hands Comments: Likely localized carpal tunnel, but patient would like to rule out of possibilities. See labs. Reviewed use of braces at night, stretches, ice, NSAIDs. To [name] for further evaluation if needed as well. Orders: -     TSH -     Vitamin B12 -     Comprehensive metabolic panel  Menorrhagia with irregular cycle -     CBC   . Reviewed expectations re: course of current medical issues. . Discussed self-management of symptoms. . Outlined signs and symptoms indicating need for more acute intervention. . Patient verbalized understanding and all questions were answered. . See orders for this visit as documented in the electronic medical record. . Patient received an After-Visit Summary.  CMA served as Education administrator during this visit. History, Physical, and Plan performed by medical provider. Documentation and orders reviewed and attested to. Briscoe Deutscher, D.O.  Briscoe Deutscher, D.O.

## 2016-03-31 NOTE — Progress Notes (Signed)
Pre visit review using our clinic review tool, if applicable. No additional management support is needed unless otherwise documented below in the visit note. 

## 2016-04-03 NOTE — Patient Instructions (Signed)
HANDOUT PROVIDED.

## 2016-04-06 ENCOUNTER — Encounter: Payer: Self-pay | Admitting: Sports Medicine

## 2016-04-06 ENCOUNTER — Ambulatory Visit (INDEPENDENT_AMBULATORY_CARE_PROVIDER_SITE_OTHER): Payer: BC Managed Care – PPO | Admitting: Sports Medicine

## 2016-04-06 ENCOUNTER — Ambulatory Visit: Payer: Self-pay

## 2016-04-06 VITALS — BP 116/82 | HR 58 | Ht 64.0 in | Wt 172.6 lb

## 2016-04-06 DIAGNOSIS — G5602 Carpal tunnel syndrome, left upper limb: Secondary | ICD-10-CM | POA: Insufficient documentation

## 2016-04-06 DIAGNOSIS — G5603 Carpal tunnel syndrome, bilateral upper limbs: Secondary | ICD-10-CM | POA: Diagnosis not present

## 2016-04-06 DIAGNOSIS — R202 Paresthesia of skin: Secondary | ICD-10-CM

## 2016-04-06 DIAGNOSIS — R200 Anesthesia of skin: Secondary | ICD-10-CM | POA: Diagnosis not present

## 2016-04-06 DIAGNOSIS — G5601 Carpal tunnel syndrome, right upper limb: Secondary | ICD-10-CM | POA: Insufficient documentation

## 2016-04-06 NOTE — Progress Notes (Signed)
OFFICE VISIT NOTE Ruth [name]. Rasa Degrazia, [facility] [phone]  Ruth [name] - 46 y.o. female MRN [id number]  Date of birth: 04-09-1970  Visit Date: 04/06/2016  PCP: Howard Pouch, DO   Referred by: [name], DO  SUBJECTIVE:   Chief Complaint  Patient presents with  . paresthesia of both hands    numbness and tingling in both hands at night and for the first 30 mins after waking. Hands feel very tight throughout the day. Sometimes there is increased redness. She has been using wrist braces at night with some relief.    HPI: As above. Additional pertinent information includes:  Patient presents with several months of worsening bilateral pain left greater than right.  This tends to persist for more than 30 minutes after awakening.  She has tried using wrist splints at night but does not tolerate these and typically removes them shortly into the evening.  She denies any increased activity recently.  She has a high Education officer, museum.  She is ambidextrous.   ROS: Review of Systems  Constitutional: Negative for chills, fever, malaise/fatigue and weight loss.  Cardiovascular: Negative for chest pain, leg swelling and PND.  Gastrointestinal: Negative for blood in stool, melena, nausea and vomiting.  Skin: Negative for rash.  Neurological: Positive for tingling. Negative for dizziness, sensory change, focal weakness, weakness and headaches.  Endo/Heme/Allergies: Does not bruise/bleed easily.    Otherwise per HPI.  HISTORY & PERTINENT PRIOR DATA:  No specialty comments available. She reports that she has never smoked. She has never used smokeless tobacco. No results for input(s): HGBA1C, LABURIC in the last 8760 hours. Medications & Allergies reviewed per EMR Patient Active Problem List   Diagnosis Date Noted  . Hyperlipidemia LDL goal <130 06/30/2015  . Encounter for preventive health examination 06/30/2015  . Right ear pain  06/16/2015  . GERD (gastroesophageal reflux disease) 06/16/2015  . Nausea with vomiting 06/16/2015  . Viral gastroenteritis 01/13/2015  . Discomfort of right ear 12/25/2014  . Vitamin D deficiency 05/10/2014  . Preventative health care 05/10/2014   Past Medical History:  Diagnosis Date  . Allergy   . Arthritis   . Asthma   . HV (hallux valgus)    repaired as child  . Prediabetes    Family History  Problem Relation Age of Onset  . Hypertension Mother   . Alcohol abuse Father   . Glaucoma Maternal Grandmother   . Asthma Maternal Grandfather   . Cancer Paternal Grandfather     lung   Past Surgical History:  Procedure Laterality Date  . BUNIONECTOMY Right    performed as child   Social History   Occupational History  . teacher     HS, basketball coach   Social History Main Topics  . Smoking status: Never Smoker  . Smokeless tobacco: Never Used  . Alcohol use No     Comment: occasional  . Drug use: No  . Sexual activity: Yes    OBJECTIVE:  VS:  HT:5\' 4"  (162.6 cm)   WT:172 lb 9.6 oz (78.3 kg)  BMI:29.7    BP:116/82  HR:(!) 58bpm  TEMP: ( )  RESP:97 % Physical Exam  Constitutional: She appears well-developed and well-nourished. She is cooperative.  Non-toxic appearance.  HENT:  Head: Normocephalic and atraumatic.  Cardiovascular: Intact distal pulses.   Pulmonary/Chest: No accessory muscle usage. No respiratory distress.  Musculoskeletal:  Bilateral hands are normal appearing.  She has  normal flexion with somewhat limited extension left greater than right.  No significant pain with carpal tunnel compression test.  Only minimal symptoms with Phalen's and Tinel's.  No thenar atrophy.  Neurological: She is alert. She has normal strength. She is not disoriented. She displays no atrophy. No sensory deficit.  Skin: Skin is warm, dry and intact. Capillary refill takes less than 2 seconds. No abrasion and no rash noted.  Psychiatric: She has a normal mood and affect.  Her speech is normal and behavior is normal. Thought content normal.    IMAGING & PROCEDURES: No results found. Findings:  PROCEDURE NOTE: ULTRASOUND GUIDED LEFT CARPAL TUNNELINJECTION  Images were obtained and interpreted by myself, Teresa Coombs, DO  Images have been saved and stored to PACS system. Images obtained on: GE S7 Ultrasound machine  Findings: Right median nerve measures 0.13 cm.  Left median nerve measures 0.17 cm No significant swelling of the flexor or extensor tendons.  No effusion of the MCPs.  DESCRIPTION OF PROCEDURE:  The patient's clinical condition is marked by substantial pain and/or significant functional disability. Other conservative therapy has not provided relief, is contraindicated, or not appropriate. There is a reasonable likelihood that injection will significantly improve the patient's pain and/or functional impairment. After discussing the risks, benefits and expected outcomes of the injection and all questions were reviewed and answered, the patient wished to undergo the above named procedure. Verbal consent was obtained. The ultrasound was used to identify the target structure and adjacent neurovascular structures. The skin was then prepped in sterile fashion and the target structure was injected under direct visualization using sterile technique as below: PREP: Alcohol, Ethel Chloride APPROACH: Radial sided, 25g 1.5" needle.  Circumferential hydrodissection of the median nerve performed INJECTATE: 2 cc 0.5% marcaine, 0.5cc 40mg  DepoMedrol ASPIRATE: N/A DRESSING: Band-Aid  Post procedural instructions including recommending icing and warning signs for infection were reviewed. This procedure was well tolerated and there were no complications.   IMPRESSION: Succesful US Guided Injection      ASSESSMENT & PLAN:  Visit Diagnoses:  1. Numbness and tingling in both hands    Meds: No orders of the defined types were placed in this encounter.     Orders:  Orders Placed This Encounter  Procedures  . [place] LIMITED JOINT SPACE STRUCTURES UP BILAT(NO LINKED CHARGES)    Follow-up: Return if symptoms worsen or fail to improve.   Otherwise please see problem oriented charting as below.

## 2016-04-06 NOTE — Patient Instructions (Signed)
Nice to meet you.  Please wear your wrist brace at nighttime and as needed for any pain.  Call us back if you have any issues!

## 2016-04-06 NOTE — Assessment & Plan Note (Signed)
Mild carpal tunnel on the right Moderate carpal tunnel on the left.  Ultrasound-guided hydrodissection and injection performed today of the left. Continue with night splints.  If any lack of improvement consider further evaluation with nerve conduction studies.

## 2016-05-03 ENCOUNTER — Encounter: Payer: Self-pay | Admitting: Family Medicine

## 2016-05-03 ENCOUNTER — Ambulatory Visit (INDEPENDENT_AMBULATORY_CARE_PROVIDER_SITE_OTHER): Payer: BC Managed Care – PPO | Admitting: Family Medicine

## 2016-05-03 VITALS — BP 115/75 | HR 69 | Temp 97.7°F | Resp 16 | Ht 64.0 in | Wt 169.0 lb

## 2016-05-03 DIAGNOSIS — R1110 Vomiting, unspecified: Secondary | ICD-10-CM

## 2016-05-03 DIAGNOSIS — R109 Unspecified abdominal pain: Secondary | ICD-10-CM | POA: Diagnosis not present

## 2016-05-03 DIAGNOSIS — R232 Flushing: Secondary | ICD-10-CM | POA: Diagnosis not present

## 2016-05-03 DIAGNOSIS — R197 Diarrhea, unspecified: Secondary | ICD-10-CM | POA: Diagnosis not present

## 2016-05-03 LAB — COMPREHENSIVE METABOLIC PANEL
ALK PHOS: 100 U/L (ref 39–117)
ALT: 13 U/L (ref 0–35)
AST: 15 U/L (ref 0–37)
Albumin: 4.4 g/dL (ref 3.5–5.2)
BUN: 16 mg/dL (ref 6–23)
CO2: 27 mEq/L (ref 19–32)
CREATININE: 0.89 mg/dL (ref 0.40–1.20)
Calcium: 10 mg/dL (ref 8.4–10.5)
Chloride: 105 mEq/L (ref 96–112)
GFR: 87.51 mL/min (ref >60.00)
GLUCOSE: 84 mg/dL (ref 70–99)
POTASSIUM: 4.6 meq/L (ref 3.5–5.1)
Sodium: 139 mEq/L (ref 135–145)
Total Bilirubin: 0.3 mg/dL (ref 0.2–1.2)
Total Protein: 7.4 g/dL (ref 6.0–8.3)

## 2016-05-03 LAB — POCT URINALYSIS DIPSTICK
Bilirubin, UA: NEGATIVE
Blood, UA: NEGATIVE
Glucose, UA: NEGATIVE
KETONES UA: NEGATIVE
LEUKOCYTES UA: NEGATIVE
Nitrite, UA: NEGATIVE
Protein, UA: NEGATIVE
Spec Grav, UA: 1.025 (ref 1.010–1.025)
Urobilinogen, UA: 0.2 E.U./dL
pH, UA: 5.5 (ref 5.0–8.0)

## 2016-05-03 LAB — T4, FREE: Free T4: 0.8 ng/dL (ref 0.60–1.60)

## 2016-05-03 LAB — CBC WITH DIFFERENTIAL/PLATELET
Basophils Absolute: 0 10*3/uL (ref 0.0–0.1)
Basophils Relative: 1 % (ref 0.0–3.0)
EOS PCT: 5.1 % — AB (ref 0.0–5.0)
Eosinophils Absolute: 0.2 10*3/uL (ref 0.0–0.7)
HCT: 39.4 % (ref 36.0–46.0)
Hemoglobin: 12.6 g/dL (ref 12.0–15.0)
Lymphocytes Relative: 38.6 % (ref 12.0–46.0)
Lymphs Abs: 1.5 10*3/uL (ref 0.7–4.0)
MCHC: 32 g/dL (ref 30.0–36.0)
MCV: 82.1 fl (ref 78.0–100.0)
Monocytes Absolute: 0.6 10*3/uL (ref 0.1–1.0)
Monocytes Relative: 14.1 % — ABNORMAL HIGH (ref 3.0–12.0)
NEUTROS PCT: 41.2 % — AB (ref 43.0–77.0)
Neutro Abs: 1.6 10*3/uL (ref 1.4–7.7)
Platelets: 306 10*3/uL (ref 150.0–400.0)
RBC: 4.81 Mil/uL (ref 3.87–5.11)
RDW: 14.1 % (ref 11.5–15.5)
WBC: 3.9 10*3/uL — ABNORMAL LOW (ref 4.0–10.5)

## 2016-05-03 LAB — C-REACTIVE PROTEIN: CRP: 0.3 mg/dL — ABNORMAL LOW (ref 0.5–20.0)

## 2016-05-03 LAB — LIPASE: LIPASE: 26 U/L (ref 11.0–59.0)

## 2016-05-03 LAB — SEDIMENTATION RATE: Sed Rate: 42 mm/hr — ABNORMAL HIGH (ref 0–20)

## 2016-05-03 LAB — H. PYLORI ANTIBODY, IGG: H Pylori IgG: NEGATIVE

## 2016-05-03 LAB — FOLLICLE STIMULATING HORMONE: FSH: 78.3 m[IU]/mL

## 2016-05-03 LAB — TSH: TSH: 0.76 u[IU]/mL (ref 0.35–4.50)

## 2016-05-03 NOTE — Progress Notes (Signed)
Pre visit review using our clinic review tool, if applicable. No additional management support is needed unless otherwise documented below in the visit note. 

## 2016-05-03 NOTE — Patient Instructions (Signed)
Increase your omeprazole to TWO tabs every morning.  Try to drink 50-60 ounces of clear fluids per day.

## 2016-05-03 NOTE — Progress Notes (Signed)
OFFICE VISIT  05/03/2016   CC:  Chief Complaint  Patient presents with  . Abdominal Pain    LLQ x 6 days, nausea/vomiting, diarrhea  . Night Sweats    x 2 weeks   HPI:    Patient is a 46 y.o. African-American female who presents for abdominal pain and vomiting. Onset about 1 week ago--GI symptoms. At first she threw up and her left mid abdomen hurt for a while after.  She actually describes a regurgitation after eating, some belching prior.  A couple days later she had some diarrhea--multiple loose BMs all day, poor appetite.   Next day after this, her BMs returned to normal formed/brown stool, but she "vomited" again, some left side of abdomen pain again.  Feels fatigued somewhat.  None of her symptoms are persistent. She is concerned about her hot flashes, sweats on her face.  Seem to be occurring in daytime as well as night. LMP 12/2015.  She has not had any sex, states there is no way she is pregnant. No vag spotting or bleeding.  Hx of irregular menses up ontil 12/2015--spacing out in 2017. No known sick contacts. No recent travel or camping. No cough, ST, nasal congestion.  No rash.  No dysuria, no hematuria, no urinary urgency or frequency.  No body aches.  Has had hot flashes for the last several months but they seem to be worse with the symptoms over the last week so this had her concerned and confused.  Past Medical History:  Diagnosis Date  . Allergy   . Arthritis   . Asthma   . GERD (gastroesophageal reflux disease)   . History of abdominal pain    2007- mild decreased GB EF; 2008-collapsing R ovarian cyst; 2012 uterine fibroid x 1.  . HV (hallux valgus)    repaired as child  . Hyperlipidemia   . Prediabetes     Past Surgical History:  Procedure Laterality Date  . BUNIONECTOMY Right    performed as child    Outpatient Medications Prior to Visit  Medication Sig Dispense Refill  . atorvastatin (LIPITOR) 20 MG tablet Take 1 tablet (20 mg total) by mouth  daily. 90 tablet 3  . omeprazole (PRILOSEC) 20 MG capsule Take 1 capsule (20 mg total) by mouth daily. 30 capsule 3   No facility-administered medications prior to visit.     Allergies  Allergen Reactions  . Other Anaphylaxis    Seafood    . Amoxicillin Rash  . Amoxicillin Rash  . Shellfish-Derived Products Rash and Swelling    ROS As per HPI  PE: Blood pressure 115/75, pulse 69, temperature 97.7 F (36.5 C), temperature source Oral, resp. rate 16, height 5' 4" (1.626 m), weight 169 lb (76.7 kg), last menstrual period 12/25/2015, SpO2 98 %. Pt examined with Laura Ray, CMA, as chaperone. ENT:Eyes: no injection, icteris, swelling, or exudate.  EOMI, PERRLA. Mouth: lips without lesion/swelling.  Oral mucosa pink and moist. Oropharynx without erythema, exudate, or swelling.  NECK: no LAD but diffuse mild thyromegaly is palpated, without nodularity or tenderness. CV: RRR, no m/r/g.   LUNGS: CTA bilat, nonlabored resps, good aeration in all lung fields. ABD: soft, non-distended, no TTP.  BS slightly increased in both lower quadrants compared to upper quadrants. No mass, no bruit, no HSM. EXT: no clubbing, cyanosis, or edema.   LABS:    Chemistry      Component Value Date/Time   NA 138 03/31/2016 0902   K 3.9 03/31/2016 0902     CL 103 03/31/2016 0902   CO2 28 03/31/2016 0902   BUN 9 03/31/2016 0902   CREATININE 0.87 03/31/2016 0902   CREATININE 0.97 05/18/2013 1841      Component Value Date/Time   CALCIUM 9.1 03/31/2016 0902   ALKPHOS 90 03/31/2016 0902   AST 23 03/31/2016 0902   ALT 19 03/31/2016 0902   BILITOT 0.2 03/31/2016 0902     Lab Results  Component Value Date   WBC 5.1 03/31/2016   HGB 12.0 03/31/2016   HCT 37.5 03/31/2016   MCV 81.6 03/31/2016   PLT 282.0 03/31/2016    CC UA today: normal.  IMPRESSION AND PLAN:  1) Suspect viral GE.   I can't explain her increased frequency/severity of hot flashes. Given overall unclear picture here, will get some  labs today, but I don't feel that any imaging is necessary at this time. Will check UA, CBC, CMET, lipase, ESR, TSH, T4, T3, H. Pylori, FSH, and LH. Her diarrhea has been resolved for 2 days so I feel like stool studies will be low yield. I advised her to increase her omeprazole to TWO 11m tabs every morning for now.  2) Perimenopausal syndrome: suspect her hot flashes are due to this. As stated above, will check FSH and LH.  I informed pt that even if these two tests are normal, she could still be perimenopausal. She expressed understanding.  An After Visit Summary was printed and given to the patient.  FOLLOW UP: Return in about 7 days (around 05/10/2016) for with Dr. KRaoul Pitchor myself.  Signed:  PCrissie Sickles MD           05/03/2016

## 2016-05-04 ENCOUNTER — Telehealth: Payer: Self-pay

## 2016-05-04 LAB — T3: T3, Total: 111 ng/dL (ref 76–181)

## 2016-05-04 LAB — LUTEINIZING HORMONE: LH: 50.41 m[IU]/mL

## 2016-05-04 NOTE — Telephone Encounter (Signed)
Patient called stating that she has an appointment with ob/gyn and will discuss further options to treat symptoms. She is still having hot flashes and they are not improving.

## 2016-05-04 NOTE — Telephone Encounter (Signed)
Noted  

## 2016-05-11 ENCOUNTER — Ambulatory Visit (HOSPITAL_BASED_OUTPATIENT_CLINIC_OR_DEPARTMENT_OTHER)
Admission: RE | Admit: 2016-05-11 | Discharge: 2016-05-11 | Disposition: A | Discharge status: Home / Self Care | Payer: BC Managed Care – PPO | Source: Ambulatory Visit | Attending: Family Medicine | Admitting: Family Medicine

## 2016-05-11 ENCOUNTER — Ambulatory Visit (INDEPENDENT_AMBULATORY_CARE_PROVIDER_SITE_OTHER): Payer: BC Managed Care – PPO | Admitting: Family Medicine

## 2016-05-11 ENCOUNTER — Encounter: Payer: Self-pay | Admitting: Family Medicine

## 2016-05-11 VITALS — BP 104/71 | HR 66 | Temp 98.0°F | Resp 18 | Ht 64.0 in | Wt 169.2 lb

## 2016-05-11 DIAGNOSIS — R519 Headache, unspecified: Secondary | ICD-10-CM

## 2016-05-11 DIAGNOSIS — N951 Menopausal and female climacteric states: Secondary | ICD-10-CM | POA: Diagnosis not present

## 2016-05-11 DIAGNOSIS — E041 Nontoxic single thyroid nodule: Secondary | ICD-10-CM

## 2016-05-11 DIAGNOSIS — R51 Headache: Secondary | ICD-10-CM

## 2016-05-11 DIAGNOSIS — R232 Flushing: Secondary | ICD-10-CM | POA: Diagnosis not present

## 2016-05-11 DIAGNOSIS — E010 Iodine-deficiency related diffuse (endemic) goiter: Secondary | ICD-10-CM | POA: Diagnosis not present

## 2016-05-11 MED ORDER — KETOROLAC TROMETHAMINE 60 MG/2ML IM SOLN
60.0000 mg | Freq: Once | INTRAMUSCULAR | Status: AC
  Administered 2016-05-11: 60 mg via INTRAMUSCULAR

## 2016-05-11 MED ORDER — PREDNISONE 20 MG PO TABS: ORAL_TABLET | ORAL | 0 refills | Status: DC

## 2016-05-11 NOTE — Patient Instructions (Addendum)
I am ordering US of the thyroid and a MRI of the brain.  Prolactin level also today to look at pituitary gland. Let me know what GYN does and how Toradol then prednisone.  May eventually want US of the pelvis.   If hormones "only" you can be treated with birth control. Or for hot flashes only can use meds (off label on some anxiety meds)   Prednisone burst to see if it resolves scalp sensation.   F/U  Dependent on results.

## 2016-05-11 NOTE — Progress Notes (Signed)
Ruth [name] , [birth year], 46 y.o., female MRN: [id number] Patient Care Team    Relationship Specialty Notifications Start End  [name], DO PCP - General Family Medicine  09/25/14   [name], MD Referring Physician Specialist  06/30/15     Chief Complaint  Patient presents with  . Follow-up    abdominal pain/Hot flashes     Subjective:  Patient presents today for follow-up on her hot flashes, with other complaints. Patient was seen by Dr. Anitra Lauth last week for this condition. She has an appointment with her gynecologist later today. All of her prior labs were reviewed with her today. She reports her hot flashes came on pretty abruptly and describes them as "intense". She reports they occur at all different times of the day, but do wake her in the middle the night. She also endorses a "migraine" headache for the last few weeks. She states the headache has never resolved and this time, and is a constant dull ache. She endorses scalp tenderness to the touch on the top of her head extending to the left side of her temporal area. It is painful for her depression her hair over this area. Her menstrual cycle has been irregular over the last 2 years. They have been occurring approximately every 3 months. Her last menstrual period was in December and lasted 2 days. She reports her mother went through menopause at age 31. She still has mild nausea, but it is greatly improved. Her abdominal pain has resolved. She states she would take Advil for the headache, and it would help blunt the pain, but never resolved the pain. She's never had a history of migraines. She reports her sister has migraines. She has a history of ovarian cysts and fibroids. She denies fever, chills, vomiting, diarrhea, neck pain, current abdominal pain, rash, visual changes or ringing in her ears.  Reviewed prior labs with her with [facility] greater than LH supporting perimenopausal state. ESR 42 (elevated), thyroid  panel normal, negative H. pylori, normal urine, normal CRP.  Depression screen [facility] 2/9 06/30/2015  Decreased Interest 0  Down, Depressed, Hopeless 0  PHQ - 2 Score 0   Allergies  Allergen Reactions  . Other Anaphylaxis    Seafood    . Amoxicillin Rash  . Amoxicillin Rash  . Shellfish-Derived Products Rash and Swelling   Social History  Substance Use Topics  . Smoking status: Never Smoker  . Smokeless tobacco: Never Used  . Alcohol use No     Comment: occasional   Past Medical History:  Diagnosis Date  . Allergy   . Arthritis   . Asthma   . GERD (gastroesophageal reflux disease)   . History of abdominal pain    2007- mild decreased GB EF; 2008-collapsing R ovarian cyst; 2012 uterine fibroid x 1.  . HV (hallux valgus)    repaired as child  . Hyperlipidemia   . Prediabetes    Past Surgical History:  Procedure Laterality Date  . BUNIONECTOMY Right    performed as child   Family History  Problem Relation Age of Onset  . Hypertension Mother   . Alcohol abuse Father   . Glaucoma Maternal Grandmother   . Asthma Maternal Grandfather   . Cancer Paternal Grandfather     lung   Allergies as of 05/11/2016      Reactions   Other Anaphylaxis   Seafood    Amoxicillin Rash   Amoxicillin Rash   Shellfish-derived Products Rash, Swelling  Medication List       Accurate as of 05/11/16 11:59 PM. Always use your most recent med list.          atorvastatin 20 MG tablet Commonly known as:  LIPITOR Take 1 tablet (20 mg total) by mouth daily.   omeprazole 20 MG capsule Commonly known as:  PRILOSEC Take 1 capsule (20 mg total) by mouth daily.   predniSONE 20 MG tablet Commonly known as:  DELTASONE 60 mg x3d, 40 mg x3d, 20 mg x2d, 10 mg x2d       All past medical history, surgical history, allergies, family history, immunizations andmedications were updated in the EMR today and reviewed under the history and medication portions of their EMR.     ROS: Negative,  with the exception of above mentioned in HPI   Objective:  BP 104/71 (BP Location: Left Arm, Patient Position: Sitting, Cuff Size: Large)   Pulse 66   Temp 98 F (36.7 C)   Resp 18   Ht '5\' 4"'  (1.626 m)   Wt 169 lb 4 oz (76.8 kg)   LMP 12/12/2015   SpO2 98%   BMI 29.05 kg/m  Body mass index is 29.05 kg/m. Gen: Afebrile. No acute distress. Nontoxic in appearance, well developed, well nourished. Pleasant African-American female. HENT: AT. [place]. Tender to palpation parietal and left temporal area to light touch. No erythema or skin lesions. MMM, no oral lesions.  Eyes:Pupils Equal Round Reactive to light, Extraocular movements intact,  Conjunctiva without redness, discharge or icterus. Neck/lymp/endocrine: Supple, no lymphadenopathy, diffuse thyromegaly present CV: RRR, no edema Chest: CTAB, no wheeze or crackles. Good air movement, normal resp effort.  Abd: Soft. NTND. BS present.  Skin: No rashes, purpura or petechiae.  Neuro:  Normal gait. PERLA. EOMi. Alert. Oriented x3  Psych: Normal affect, dress and demeanor. Normal speech. Normal thought content and judgment.  No exam data present US Thyroid  Result Date: 05/12/2016 CLINICAL DATA:  Palpable abnormality. Thyromegaly on physical examination. EXAM: THYROID ULTRASOUND TECHNIQUE: Ultrasound examination of the thyroid gland and adjacent soft tissues was performed. COMPARISON:  None. FINDINGS: Parenchymal Echotexture: Normal Isthmus: Normal in size measuring 0.3 cm in diameter Right lobe: Normal in size measuring 4.8 x 1.8 x 1.6 cm Left lobe: Normal in size measuring 5.1 x 1.6 x 1.6 cm _________________________________________________________ Estimated total number of nodules >/= 1 cm: 1 Number of spongiform nodules >/=  2 cm not described below (TR1): 0 Number of mixed cystic and solid nodules >/= 1.5 cm not described below (TR2): 0 _________________________________________________________ Nodule # 1: Location: Left; Mid Maximum size: 1.5  cm; Other 2 dimensions: 1.4 x 1.2 cm Composition: solid/almost completely solid (2) Echogenicity: hypoechoic (2) Shape: taller-than-wide (3) Margins: ill-defined (0) Echogenic foci: none (0) ACR TI-RADS total points: 7. ACR TI-RADS risk category: TR5 (>/= 7 points). ACR TI-RADS recommendations: **Given size (>/= 1.0 cm) and appearance, fine needle aspiration of this highly suspicious nodule should be considered based on TI-RADS criteria. _________________________________________________________ There are 3 additional punctate (sub 4 mm) anechoic cystic nodules scattered within both the right and left lobes of the thyroid, none of which meet meet imaging criteria to recommend percutaneous sampling or dedicated follow-up. IMPRESSION: Findings suggestive of multinodular goiter. Left-sided thyroid nodule #1 meets imaging criteria to recommend percutaneous sampling as clinically indicated. The above is in keeping with the ACR TI-RADS recommendations - J Am Coll Radiol 2017;14:587-595. Electronically Signed   By: Sandi Mariscal M.D.   On: 05/12/2016 08:09  No results found for this or any previous visit (from the past 24 hour(s)).  Assessment/Plan: Ruth [name] is a 46 y.o. female present for OV for  Thyromegaly - Ultrasound of thyroid completed 05/12/2016, with (multiple nodular thyroid. One nodule suspicious meeting criteria for biopsy. This was discussed with patient, and Referral for thyroid biopsy completed today. Patient will follow-up in 3-4 days after biopsy to review results. If indicated will refer to ENT. - US THYROID; Future  Intractable headache, unspecified chronicity pattern, unspecified headache type - Patient reported great response to Toradol injection in the office. Therefore will hold on MRI of the brain at this time. - Prolactin normal - ketorolac (TORADOL) injection 60 mg; Inject 2 mLs (60 mg total) into the muscle once. - She will follow-up within the next 2 weeks after her thyroid  biopsy, and if felt indicated at that time can discuss MRI of the brain pending upon clinical improvement with prednisone.  Hot flashes/Perimenopause - Could be secondary to thyroid condition. Ultrasound positive for multiple thyroid nodules, one meeting criteria. - Patient has seen gynecology, and they have ordered an ultrasound of her pelvis. - We will wait for biopsy results and pelvic ultrasound results before moving forward on any type of medication for hot flashes such as hormones or SSRI.  Reviewed expectations re: course of current medical issues.  Discussed self-management of symptoms.  Outlined signs and symptoms indicating need for more acute intervention.  Patient verbalized understanding and all questions were answered.  Patient received an After-Visit Summary.  Greater than 40 minutes spent with patient, >50% of time spent face to face counseling and/or coordinating care.    Note is dictated utilizing voice recognition software. Although note has been proof read prior to signing, occasional typographical errors still can be missed. If any questions arise, please do not hesitate to call for verification.   electronically signed by:  Howard Pouch, DO  [place]

## 2016-05-12 ENCOUNTER — Telehealth: Payer: Self-pay | Admitting: Family Medicine

## 2016-05-12 ENCOUNTER — Encounter: Payer: Self-pay | Admitting: Family Medicine

## 2016-05-12 DIAGNOSIS — R519 Headache, unspecified: Secondary | ICD-10-CM | POA: Insufficient documentation

## 2016-05-12 DIAGNOSIS — E041 Nontoxic single thyroid nodule: Secondary | ICD-10-CM

## 2016-05-12 DIAGNOSIS — R51 Headache: Secondary | ICD-10-CM

## 2016-05-12 DIAGNOSIS — E010 Iodine-deficiency related diffuse (endemic) goiter: Secondary | ICD-10-CM | POA: Insufficient documentation

## 2016-05-12 DIAGNOSIS — N951 Menopausal and female climacteric states: Secondary | ICD-10-CM | POA: Insufficient documentation

## 2016-05-12 LAB — PROLACTIN: Prolactin: 7 ng/mL

## 2016-05-12 NOTE — Telephone Encounter (Signed)
Spoke with patient reviewed results and information . Patient verbalized understanding . Patient will schedule an appt 2-3 days after completion of thyroid biopsy.

## 2016-05-12 NOTE — Telephone Encounter (Signed)
Please call patient this afternoon. I tried to call her but received VM, left brief message to return our call. She also may call back.  - I received her E-chart message. I am glad she had relief of her head discomfort. I believe that was secondary to the Toradol injection she received in the office which is an anti-inflammatory. Therefore I do recommend she takes the prednisone as scheduled.  - Her blood work collected at our office yesterday is normal (prolactin). - Her thyroid ultrasound did show evidence of a multiple nodules, one meeting criteria for biopsy. I have ordered this to be completed and she should get a call to schedule. This is completed through radiology department like her [place] and they will explain procedure to her. It is not complicated and well tolerated.  I will need to see her back 3-4 days after biopsy to review results and discuss further management.  - we will also discuss need of MRI of her brain at that time, since she had such quick resolution with Toradol.    Documentation only: She has an [place] of pelvis scheduled 5/16 through GYN.

## 2016-05-19 ENCOUNTER — Other Ambulatory Visit (HOSPITAL_COMMUNITY)
Admission: RE | Admit: 2016-05-19 | Discharge: 2016-05-19 | Disposition: A | Discharge status: Home / Self Care | Payer: BC Managed Care – PPO | Source: Ambulatory Visit | Attending: Radiology | Admitting: Radiology

## 2016-05-19 ENCOUNTER — Ambulatory Visit
Admission: RE | Admit: 2016-05-19 | Discharge: 2016-05-19 | Disposition: A | Discharge status: Home / Self Care | Payer: BC Managed Care – PPO | Source: Ambulatory Visit | Attending: Family Medicine | Admitting: Family Medicine

## 2016-05-19 DIAGNOSIS — E041 Nontoxic single thyroid nodule: Secondary | ICD-10-CM

## 2016-05-20 LAB — HM PAP SMEAR: HM Pap smear: NORMAL

## 2016-05-21 ENCOUNTER — Telehealth: Payer: Self-pay | Admitting: Family Medicine

## 2016-05-21 NOTE — Telephone Encounter (Signed)
Please call pt: - I have the results to her thyroid biopsy. Briefly it does not appear to be cancerous by sample.  - I would like to follow up with her in person and discuss all images and follow ups, her headache if she  still having) and her hot flashes.

## 2016-05-21 NOTE — Telephone Encounter (Signed)
Left detailed message with results and instructions on patient voice mail per DPR 

## 2016-05-24 ENCOUNTER — Encounter: Payer: Self-pay | Admitting: Family Medicine

## 2016-05-24 ENCOUNTER — Ambulatory Visit (INDEPENDENT_AMBULATORY_CARE_PROVIDER_SITE_OTHER): Payer: BC Managed Care – PPO | Admitting: Family Medicine

## 2016-05-24 VITALS — BP 108/73 | HR 67 | Temp 98.0°F | Resp 16 | Ht 64.0 in | Wt 166.0 lb

## 2016-05-24 DIAGNOSIS — R519 Headache, unspecified: Secondary | ICD-10-CM

## 2016-05-24 DIAGNOSIS — R51 Headache: Secondary | ICD-10-CM

## 2016-05-24 DIAGNOSIS — G43019 Migraine without aura, intractable, without status migrainosus: Secondary | ICD-10-CM

## 2016-05-24 MED ORDER — RIZATRIPTAN BENZOATE 10 MG PO TABS: 10.0000 mg | ORAL_TABLET | ORAL | 0 refills | Status: DC | PRN

## 2016-05-24 NOTE — Progress Notes (Signed)
OFFICE VISIT  05/24/2016   CC:  Chief Complaint  Patient presents with  . Headache  . Emesis   HPI:    Patient is a 46 y.o.  female who presents for headaches. Last time she was in she had a migraine, was given oral steroids.  HA subsided, but as she weened off the HA gradually came back consistently.  Current HA described as consistent dull pain around bilat peri-orbital areas.  NO throbbing.  No radiation of pain.  No aura or pre-migraine sx's.  HA's can start any time of day.  She does not identify any triggers for her HA's. Has been having these HAs only for the last 3 weeks or so.  With the HA, she gets usually NO additional sx's. Recently she had an episode of vomiting but had no preceding nausea.  No photo or phonophobia.  No diarrhea. No vision or hearing complaints with these HA's.  Denies allergic rhinitis sx's.   Has some need to pop right ear.  No fevers.  No pain or stiffness in neck. No abd pain.  NO paresthesias and no focal weakness. No signif recent change in life stress.  Took anti-emetic rx'd in the past x 1 dose: no help. She has never had any triptans.     Past Medical History:  Diagnosis Date  . Allergy   . Arthritis   . Asthma   . GERD (gastroesophageal reflux disease)   . History of abdominal pain    2007- mild decreased GB EF; 2008-collapsing R ovarian cyst; 2012 uterine fibroid x 1.  . HV (hallux valgus)    repaired as child  . Hyperlipidemia   . Prediabetes     Past Surgical History:  Procedure Laterality Date  . BUNIONECTOMY Right    performed as child    Outpatient Medications Prior to Visit  Medication Sig Dispense Refill  . atorvastatin (LIPITOR) 20 MG tablet Take 1 tablet (20 mg total) by mouth daily. 90 tablet 3  . omeprazole (PRILOSEC) 20 MG capsule Take 1 capsule (20 mg total) by mouth daily. 30 capsule 3  . predniSONE (DELTASONE) 20 MG tablet 60 mg x3d, 40 mg x3d, 20 mg x2d, 10 mg x2d (Patient not taking: Reported on 05/24/2016)  18 tablet 0   No facility-administered medications prior to visit.     Allergies  Allergen Reactions  . Other Anaphylaxis    Seafood    . Amoxicillin Rash  . Amoxicillin Rash  . Shellfish-Derived Products Rash and Swelling    ROS As per HPI  PE: Blood pressure 108/73, pulse 67, temperature 98 F (36.7 C), temperature source Oral, resp. rate 16, height 5\' 4"  (1.626 m), weight 166 lb (75.3 kg), last menstrual period 12/16/2015, SpO2 97 %. Gen: Alert, well appearing.  Patient is oriented to person, place, time, and situation. AFFECT: pleasant, lucid thought and speech. ENT: Ears: EACs clear, normal epithelium.  TMs with good light reflex and landmarks bilaterally.  Eyes: no injection, icteris, swelling, or exudate.  EOMI, PERRLA. Nose: no drainage or turbinate edema/swelling.  No injection or focal lesion.  Mouth: lips without lesion/swelling.  Oral mucosa pink and moist.  Dentition intact and without obvious caries or gingival swelling.  Oropharynx without erythema, exudate, or swelling.  Neck - No masses or thyromegaly or limitation in range of motion CV: RRR, no m/r/g.   LUNGS: CTA bilat, nonlabored resps, good aeration in all lung fields. EXT: no clubbing, cyanosis, or edema.  Neuro: CN 2-12 intact bilaterally,  strength 5/5 in proximal and distal upper extremities and lower extremities bilaterally.  No tremor.   No ataxia.  Upper extremity and lower extremity DTRs symmetric.  No pronator drift.   LABS:  Lab Results  Component Value Date   TSH 0.76 05/03/2016     Chemistry      Component Value Date/Time   NA 139 05/03/2016 1435   K 4.6 05/03/2016 1435   CL 105 05/03/2016 1435   CO2 27 05/03/2016 1435   BUN 16 05/03/2016 1435   CREATININE 0.89 05/03/2016 1435   CREATININE 0.97 05/18/2013 1841      Component Value Date/Time   CALCIUM 10.0 05/03/2016 1435   ALKPHOS 100 05/03/2016 1435   AST 15 05/03/2016 1435   ALT 13 05/03/2016 1435   BILITOT 0.3 05/03/2016 1435      Lab Results  Component Value Date   WBC 3.9 (L) 05/03/2016   HGB 12.6 05/03/2016   HCT 39.4 05/03/2016   MCV 82.1 05/03/2016   PLT 306.0 05/03/2016     IMPRESSION AND PLAN:  New onset HA syndrome. Suspect migraine HAs>tension HA's. Will do trial of maxalt 10mg  prn.  Therapeutic expectations and side effect profile of medication discussed today.  Patient's questions answered. Refer to neurology for further evaluation since etiology of HA's is not totally clear, plus new onset of HA syndrome in late 55s. No red flags to indicate need for any imaging at this time.  An After Visit Summary was printed and given to the patient.  FOLLOW UP: Return in about 4 weeks (around 06/21/2016) for f/u headaches (with Dr. Raoul Pitch or myself).  Signed:  Crissie Sickles, MD           05/24/2016

## 2016-07-06 ENCOUNTER — Other Ambulatory Visit: Payer: Self-pay | Admitting: *Deleted

## 2016-07-06 DIAGNOSIS — K219 Gastro-esophageal reflux disease without esophagitis: Secondary | ICD-10-CM

## 2016-07-06 MED ORDER — OMEPRAZOLE 20 MG PO CPDR: 20.0000 mg | DELAYED_RELEASE_CAPSULE | Freq: Every day | ORAL | 0 refills | Status: DC

## 2016-11-12 ENCOUNTER — Ambulatory Visit: Payer: BC Managed Care – PPO | Admitting: Family Medicine

## 2016-11-12 ENCOUNTER — Encounter: Payer: Self-pay | Admitting: Family Medicine

## 2016-11-12 VITALS — BP 121/85 | HR 73 | Temp 97.6°F | Resp 20 | Wt 173.8 lb

## 2016-11-12 DIAGNOSIS — M25531 Pain in right wrist: Secondary | ICD-10-CM | POA: Diagnosis not present

## 2016-11-12 DIAGNOSIS — H6983 Other specified disorders of Eustachian tube, bilateral: Secondary | ICD-10-CM | POA: Diagnosis not present

## 2016-11-12 DIAGNOSIS — M7712 Lateral epicondylitis, left elbow: Secondary | ICD-10-CM | POA: Diagnosis not present

## 2016-11-12 DIAGNOSIS — H6993 Unspecified Eustachian tube disorder, bilateral: Secondary | ICD-10-CM

## 2016-11-12 DIAGNOSIS — J0100 Acute maxillary sinusitis, unspecified: Secondary | ICD-10-CM | POA: Diagnosis not present

## 2016-11-12 DIAGNOSIS — E785 Hyperlipidemia, unspecified: Secondary | ICD-10-CM

## 2016-11-12 MED ORDER — DOXYCYCLINE HYCLATE 100 MG PO TABS: 100.0000 mg | ORAL_TABLET | Freq: Two times a day (BID) | ORAL | 0 refills | Status: DC

## 2016-11-12 MED ORDER — FLUTICASONE PROPIONATE 50 MCG/ACT NA SUSP: 2.0000 | Freq: Every day | NASAL | 6 refills | Status: DC

## 2016-11-12 MED ORDER — PREDNISONE 20 MG PO TABS: 40.0000 mg | ORAL_TABLET | Freq: Every day | ORAL | 0 refills | Status: DC

## 2016-11-12 MED ORDER — MELOXICAM 15 MG PO TABS: 15.0000 mg | ORAL_TABLET | Freq: Every day | ORAL | 0 refills | Status: DC

## 2016-11-12 MED ORDER — ATORVASTATIN CALCIUM 20 MG PO TABS: 20.0000 mg | ORAL_TABLET | Freq: Every day | ORAL | 3 refills | Status: DC

## 2016-11-12 NOTE — Progress Notes (Signed)
Ruth [name] , 01/02/1971, 46 y.o., female MRN: [id number] Patient Care Team    Relationship Specialty Notifications Start End  Ruth Hillock, DO PCP - General Family Medicine  09/25/14   [name], MD Referring Physician Specialist  06/30/15     Chief Complaint  Patient presents with  . Ear Pain    bilateral, right side worse  . Hand Problem    tingling sensation left index finger     Subjective:   Hyperlipidemia LDL goal <130 Patient has hyperlipidemia, she went without insurance for a few months therefore without medications. Refills on atorvastatin are needed.   Lateral epicondylitis of left elbow Patient complains of left hand discomfort and numbness tingling in her left index. She is both left-handed and right-handed, but is more right hand dominate. She has been biking as a new increased activity. She feels the numbness and tingling are worse at night, but has started to recur all through the day. She has not tried any medications.  Right wrist pain Patient complains of right medial wrist/forearm discomfort. Per above she has been biking, otherwise no injury or activity change. She denies any redness, swelling or fever.  Ear pain: Patient presents today with a 2 month history of ear discomfort. She reports the right ear is worse than her left. She has a constant state of your fullness feeling like they need to pop. She feels they have become worse over the last weekend and she had a slight fever and felt achy.    Depression screen [facility] 2/9 11/12/2016 06/30/2015  Decreased Interest 0 0  Down, Depressed, Hopeless 0 0  PHQ - 2 Score 0 0    Allergies  Allergen Reactions  . Other Anaphylaxis    Seafood    . Amoxicillin Rash  . Amoxicillin Rash  . Shellfish-Derived Products Rash and Swelling   Social History   Tobacco Use  . Smoking status: Never Smoker  . Smokeless tobacco: Never Used  Substance Use Topics  . Alcohol use: No    Comment:  occasional   Past Medical History:  Diagnosis Date  . Allergy   . Arthritis   . Asthma   . GERD (gastroesophageal reflux disease)   . History of abdominal pain    2007- mild decreased GB EF; 2008-collapsing R ovarian cyst; 2012 uterine fibroid x 1.  . HV (hallux valgus)    repaired as child  . Hyperlipidemia   . Prediabetes    Past Surgical History:  Procedure Laterality Date  . BUNIONECTOMY Right    performed as child   Family History  Problem Relation Age of Onset  . Hypertension Mother   . Alcohol abuse Father   . Glaucoma Maternal Grandmother   . Asthma Maternal Grandfather   . Cancer Paternal Grandfather        lung   Allergies as of 11/12/2016      Reactions   Other Anaphylaxis   Seafood    Amoxicillin Rash   Amoxicillin Rash   Shellfish-derived Products Rash, Swelling      Medication List        Accurate as of 11/12/16  3:14 PM. Always use your most recent med list.          atorvastatin 20 MG tablet Commonly known as:  LIPITOR Take 1 tablet (20 mg total) by mouth daily.   omeprazole 20 MG capsule Commonly known as:  PRILOSEC Take 1 capsule (20 mg total) by mouth daily.  rizatriptan 10 MG tablet Commonly known as:  MAXALT Take 1 tablet (10 mg total) by mouth as needed for migraine. May repeat in 2 hours if needed       All past medical history, surgical history, allergies, family history, immunizations andmedications were updated in the EMR today and reviewed under the history and medication portions of their EMR.     ROS: Negative, with the exception of above mentioned in HPI   Objective:  BP 121/85 (BP Location: Left Arm, Patient Position: Sitting, Cuff Size: Large)   Pulse 73   Temp 97.6 F (36.4 C)   Resp 20   Wt 173 lb 12 oz (78.8 kg)   SpO2 100%   BMI 29.82 kg/m  Body mass index is 29.82 kg/m. Gen: Afebrile. No acute distress. Nontoxic in appearance, well developed, well nourished. Pleasant African-American female. HENT: AT.  [place]. Bilateral TM visualized with bilateral air-fluid levels, no erythema or bulging.. MMM, no oral lesions. Bilateral nares with a left nare erythema, swelling and drainage. Throat without erythema or exudates. No cough. Mild tenderness to palpation left maxillary sinus. Eyes:Pupils Equal Round Reactive to light, Extraocular movements intact,  Conjunctiva without redness, discharge or icterus. Neck/lymp/endocrine: Supple, no lymphadenopathy CV: RRR  Chest: CTAB, no wheeze or crackles. Good air movement, normal resp effort.  MSK: No erythema, no soft tissue swelling bilateral upper extremity.     Right wrist: Mild muscle tenderness to right medial forearm. No bony tenderness. Full range of motion. Negative Tinel's at elbow and wrist. Neurovascularly intact distally.     Left: No tenderness to palpation. No bony tenderness. Full range of motion. Positive Tinel's at elbow. Negative Tinel's at wrist. Neurovascularly intact distally. Skin: No rashes, purpura or petechiae.  Neuro:  Normal gait. PERLA. EOMi. Alert. Oriented x3  No exam data present No results found. No results found for this or any previous visit (from the past 24 hour(s)).  Assessment/Plan: Ruth [name] is a 46 y.o. female present for OV for  Acute maxillary sinusitis, recurrence not specified/eustachian tube dysfunction - Rest, hydrate, doxycycline for maxillary sinusitis. Prednisone burst. Flonase start. Galbreath maneuver. - doxycycline (VIBRA-TABS) 100 MG tablet; Take 1 tablet (100 mg total) 2 (two) times daily by mouth.  Dispense: 20 tablet; Refill: 0  Hyperlipidemia LDL goal <130 - Restart atorvastatin - atorvastatin (LIPITOR) 20 MG tablet; Take 1 tablet (20 mg total) daily by mouth.  Dispense: 90 tablet; Refill: 3  Lateral epicondylitis of left elbow/right wrist pain - Discussed use of arm band for epicondylitis. Start meloxicam daily with meal. - If biking felt to be the cause, she should look into changing the  grips etc on bike. - meloxicam (MOBIC) 15 MG tablet; Take 1 tablet (15 mg total) daily by mouth.  Dispense: 30 tablet; Refill: 0 Follow-up when necessary  Reviewed expectations re: course of current medical issues.  Discussed self-management of symptoms.  Outlined signs and symptoms indicating need for more acute intervention.  Patient verbalized understanding and all questions were answered.  Patient received an After-Visit Summary.    No orders of the defined types were placed in this encounter.    Note is dictated utilizing voice recognition software. Although note has been proof read prior to signing, occasional typographical errors still can be missed. If any questions arise, please do not hesitate to call for verification.   electronically signed by:  Howard Pouch, DO  [place]

## 2016-11-12 NOTE — Patient Instructions (Addendum)
Tennis Elbow Tennis elbow (lateral epicondylitis) is inflammation of the outer tendons of your forearm close to your elbow. Your tendons attach your muscles to your bones. The outer tendons of your forearm are used to extend your wrist, and they attach on the outside part of your elbow. Tennis elbow is often found in people who play tennis, but anyone may get the condition from repeatedly extending the wrist or turning the forearm. What are the causes? This condition is caused by repeatedly extending your wrist and using your hands. It can result from sports or work that requires repetitive forearm movements. Tennis elbow may also be caused by an injury. What increases the risk? You have a higher risk of developing tennis elbow if you play tennis or another racquet sport. You also have a higher risk if you frequently use your hands for work. This condition is also more likely to develop in:  Musicians.  Carpenters, painters, and plumbers.  Cooks.  Cashiers.  People who work in factories.  Construction workers.  Butchers.  People who use computers.  What are the signs or symptoms? Symptoms of this condition include:  Pain and tenderness in your forearm and the outer part of your elbow. You may only feel the pain when you use your arm, or you may feel it even when you are not using your arm.  A burning feeling that runs from your elbow through your arm.  Weak grip in your hands.  How is this diagnosed? This condition may be diagnosed by medical history and physical exam. You may also have other tests, including:  X-rays.  MRI.  How is this treated? Your health care provider will recommend lifestyle adjustments, such as resting and icing your arm. Treatment may also include:  Medicines for inflammation. This may include shots of cortisone if your pain continues.  Physical therapy. This may include massage or exercises.  An elbow brace.  Surgery may eventually be  recommended if your pain does not go away with treatment. Follow these instructions at home: Activity  Rest your elbow and wrist as directed by your health care provider. Try to avoid any activities that caused the problem until your health care provider says that you can do them again.  If a physical therapist teaches you exercises, do all of them as directed.  If you lift an object, lift it with your palm facing upward. This lowers the stress on your elbow. Lifestyle  If your tennis elbow is caused by sports, check your equipment and make sure that: ? You are using it correctly. ? It is the best fit for you.  If your tennis elbow is caused by work, take breaks frequently, if you are able. Talk with your manager about how to best perform tasks in a way that is safe. ? If your tennis elbow is caused by computer use, talk with your manager about any changes that can be made to your work environment. General instructions  If directed, apply ice to the painful area: ? Put ice in a plastic bag. ? Place a towel between your skin and the bag. ? Leave the ice on for 20 minutes, 2-3 times per day.  Take medicines only as directed by your health care provider.  If you were given a brace, wear it as directed by your health care provider.  Keep all follow-up visits as directed by your health care provider. This is important. Contact a health care provider if:  Your pain does not   get better with treatment.  Your pain gets worse.  You have numbness or weakness in your forearm, hand, or fingers. This information is not intended to replace advice given to you by your health care provider. Make sure you discuss any questions you have with your health care provider. Document Released: 12/21/2004 Document Revised: 08/21/2015 Document Reviewed: 12/17/2013 Elsevier Interactive Patient Education  2018 [place] prednisone today, 2 tabs daily for 5 days with food.  Doxycyline every  12 hours for 10 days.  Get the armband we discussed and wear 2 weeks during waking hours.  After prednisone is completed start the meloxicam daily (with food).  Follow up 4 weeks if not improved.  Make physical with fasting labs.

## 2016-11-15 ENCOUNTER — Other Ambulatory Visit (INDEPENDENT_AMBULATORY_CARE_PROVIDER_SITE_OTHER): Payer: BC Managed Care – PPO

## 2016-11-15 DIAGNOSIS — E785 Hyperlipidemia, unspecified: Secondary | ICD-10-CM | POA: Diagnosis not present

## 2016-11-15 DIAGNOSIS — E559 Vitamin D deficiency, unspecified: Secondary | ICD-10-CM | POA: Diagnosis not present

## 2016-11-15 LAB — COMPREHENSIVE METABOLIC PANEL
ALK PHOS: 88 U/L (ref 39–117)
ALT: 18 U/L (ref 0–35)
AST: 16 U/L (ref 0–37)
Albumin: 4.2 g/dL (ref 3.5–5.2)
BILIRUBIN TOTAL: 0.3 mg/dL (ref 0.2–1.2)
BUN: 18 mg/dL (ref 6–23)
CALCIUM: 10 mg/dL (ref 8.4–10.5)
CO2: 28 mEq/L (ref 19–32)
CREATININE: 0.98 mg/dL (ref 0.40–1.20)
Chloride: 104 mEq/L (ref 96–112)
GFR: 78.12 mL/min (ref >60.00)
GLUCOSE: 86 mg/dL (ref 70–99)
Potassium: 4.1 mEq/L (ref 3.5–5.1)
Sodium: 140 mEq/L (ref 135–145)
TOTAL PROTEIN: 7.2 g/dL (ref 6.0–8.3)

## 2016-11-15 LAB — CBC WITH DIFFERENTIAL/PLATELET
BASOS ABS: 0.1 10*3/uL (ref 0.0–0.1)
Basophils Relative: 1.6 % (ref 0.0–3.0)
EOS ABS: 0.1 10*3/uL (ref 0.0–0.7)
Eosinophils Relative: 1 % (ref 0.0–5.0)
HEMATOCRIT: 38.5 % (ref 36.0–46.0)
Hemoglobin: 12.3 g/dL (ref 12.0–15.0)
LYMPHS PCT: 40 % (ref 12.0–46.0)
Lymphs Abs: 3.1 10*3/uL (ref 0.7–4.0)
MCHC: 32 g/dL (ref 30.0–36.0)
MCV: 82.3 fl (ref 78.0–100.0)
MONOS PCT: 10.1 % (ref 3.0–12.0)
Monocytes Absolute: 0.8 10*3/uL (ref 0.1–1.0)
Neutro Abs: 3.7 10*3/uL (ref 1.4–7.7)
Neutrophils Relative %: 47.3 % (ref 43.0–77.0)
Platelets: 302 10*3/uL (ref 150.0–400.0)
RBC: 4.68 Mil/uL (ref 3.87–5.11)
RDW: 13.7 % (ref 11.5–15.5)
WBC: 7.8 10*3/uL (ref 4.0–10.5)

## 2016-11-15 LAB — VITAMIN D 25 HYDROXY (VIT D DEFICIENCY, FRACTURES): VITD: 28.16 ng/mL — ABNORMAL LOW (ref 30.00–100.00)

## 2016-11-15 LAB — LIPID PANEL
CHOL/HDL RATIO: 4
Cholesterol: 239 mg/dL — ABNORMAL HIGH (ref 0–200)
HDL: 66.7 mg/dL (ref >39.00)
LDL Cholesterol: 149 mg/dL — ABNORMAL HIGH (ref 0–99)
NONHDL: 172.34
TRIGLYCERIDES: 117 mg/dL (ref 0.0–149.0)
VLDL: 23.4 mg/dL (ref 0.0–40.0)

## 2016-11-15 LAB — TSH: TSH: 1.46 u[IU]/mL (ref 0.35–4.50)

## 2016-11-15 LAB — HEMOGLOBIN A1C: HEMOGLOBIN A1C: 6 % (ref 4.6–6.5)

## 2016-11-16 ENCOUNTER — Ambulatory Visit (INDEPENDENT_AMBULATORY_CARE_PROVIDER_SITE_OTHER): Payer: BC Managed Care – PPO | Admitting: Family Medicine

## 2016-11-16 ENCOUNTER — Encounter: Payer: Self-pay | Admitting: Family Medicine

## 2016-11-16 VITALS — BP 129/85 | HR 72 | Temp 98.0°F | Resp 20 | Ht 64.0 in | Wt 174.0 lb

## 2016-11-16 DIAGNOSIS — E663 Overweight: Secondary | ICD-10-CM

## 2016-11-16 DIAGNOSIS — E041 Nontoxic single thyroid nodule: Secondary | ICD-10-CM | POA: Diagnosis not present

## 2016-11-16 DIAGNOSIS — Z0000 Encounter for general adult medical examination without abnormal findings: Secondary | ICD-10-CM | POA: Diagnosis not present

## 2016-11-16 DIAGNOSIS — E785 Hyperlipidemia, unspecified: Secondary | ICD-10-CM

## 2016-11-16 DIAGNOSIS — R7309 Other abnormal glucose: Secondary | ICD-10-CM | POA: Diagnosis not present

## 2016-11-16 NOTE — Progress Notes (Signed)
Patient ID: Ruth [name], female   DOB: [birth year], 46 y.o.   MRN: [id number]      Patient ID: Ruth [name], female  DOB: [birth year], 46 y.o.   MRN: [id number]  Subjective:  Ruth [name] is a 46 y.o.  female present for CPE. All past medical history, surgical history, allergies, family history, immunizations, medications and social history were updated  Today in the electronic medical record today. All recent labs, ED visits and hospitalizations within the last year were reviewed.  Patient Care Team    Relationship Specialty Notifications Start End  [name], DO PCP - General Family Medicine  09/25/14   [name], MD Referring Physician Specialist  06/30/15     Health maintenance: Updated 11/16/2016 Colonoscopy: routine screening at 75, No Fhx Mammogram: 05/2016--> "normal" per pt at GYN Cervical cancer screening: 05/2016 per pt, "debbie" at [facility] and associated--> records requested Immunizations: UTD tdap (2015), flu shot yearly (currently ill--> schedule in 1 week nurse visit) Infectious disease screening: HIV completed DEXA: Never Assistive device: None Oxygen use:  none Patient has a Dental home. Hospitalizations/ED visits: reviewed  Recent Results (from the past 2160 hour(s))  CBC w/Diff     Status: None   Collection Time: 11/15/16  8:21 AM  Result Value Ref Range   WBC 7.8 4.0 - 10.5 K/uL   RBC 4.68 3.87 - 5.11 Mil/uL   Hemoglobin 12.3 12.0 - 15.0 g/dL   HCT 38.5 36.0 - 46.0 %   MCV 82.3 78.0 - 100.0 fl   MCHC 32.0 30.0 - 36.0 g/dL   RDW 13.7 11.5 - 15.5 %   Platelets 302.0 150.0 - 400.0 K/uL   Neutrophils Relative % 47.3 43.0 - 77.0 %   Lymphocytes Relative 40.0 12.0 - 46.0 %   Monocytes Relative 10.1 3.0 - 12.0 %   Eosinophils Relative 1.0 0.0 - 5.0 %   Basophils Relative 1.6 0.0 - 3.0 %   Neutro Abs 3.7 1.4 - 7.7 K/uL   Lymphs Abs 3.1 0.7 - 4.0 K/uL   Monocytes Absolute 0.8 0.1 - 1.0 K/uL   Eosinophils Absolute 0.1 0.0 - 0.7  K/uL   Basophils Absolute 0.1 0.0 - 0.1 K/uL  Comp Met (CMET)     Status: None   Collection Time: 11/15/16  8:21 AM  Result Value Ref Range   Sodium 140 135 - 145 mEq/L   Potassium 4.1 3.5 - 5.1 mEq/L   Chloride 104 96 - 112 mEq/L   CO2 28 19 - 32 mEq/L   Glucose, Bld 86 70 - 99 mg/dL   BUN 18 6 - 23 mg/dL   Creatinine, Ser 0.98 0.40 - 1.20 mg/dL   Total Bilirubin 0.3 0.2 - 1.2 mg/dL   Alkaline Phosphatase 88 39 - 117 U/L   AST 16 0 - 37 U/L   ALT 18 0 - 35 U/L   Total Protein 7.2 6.0 - 8.3 g/dL   Albumin 4.2 3.5 - 5.2 g/dL   Calcium 10.0 8.4 - 10.5 mg/dL   GFR 78.12 >60.00 mL/min  Lipid panel     Status: Abnormal   Collection Time: 11/15/16  8:21 AM  Result Value Ref Range   Cholesterol 239 (H) 0 - 200 mg/dL    Comment: ATP III Classification       Desirable:  < 200 mg/dL               Borderline High:  200 - 239 mg/dL  High:  > = 240 mg/dL   Triglycerides 117.0 0.0 - 149.0 mg/dL    Comment: Normal:  <150 mg/dLBorderline High:  150 - 199 mg/dL   HDL 66.70 >39.00 mg/dL   VLDL 23.4 0.0 - 40.0 mg/dL   LDL Cholesterol 149 (H) 0 - 99 mg/dL   Total CHOL/HDL Ratio 4     Comment:                Men          Women1/2 Average Risk     3.4          3.3Average Risk          5.0          4.42X Average Risk          9.6          7.13X Average Risk          15.0          11.0                       NonHDL 172.34     Comment: NOTE:  Non-HDL goal should be 30 mg/dL higher than patient's LDL goal (i.e. LDL goal of < 70 mg/dL, would have non-HDL goal of < 100 mg/dL)  TSH     Status: None   Collection Time: 11/15/16  8:21 AM  Result Value Ref Range   TSH 1.46 0.35 - 4.50 uIU/mL  HgB A1c     Status: None   Collection Time: 11/15/16  8:21 AM  Result Value Ref Range   Hgb A1c MFr Bld 6.0 4.6 - 6.5 %    Comment: Glycemic Control Guidelines for People with Diabetes:Non Diabetic:  <6%Goal of Therapy: <7%Additional Action Suggested:  >8%   Vitamin D (25 hydroxy)     Status: Abnormal    Collection Time: 11/15/16  8:21 AM  Result Value Ref Range   VITD 28.16 (L) 30.00 - 100.00 ng/mL     Depression screen [facility] 2/9 11/16/2016 11/12/2016 06/30/2015  Decreased Interest 0 0 0  Down, Depressed, Hopeless 0 0 0  PHQ - 2 Score 0 0 0    Current Exercise Habits: Home exercise routine, Type of exercise: Other - see comments;treadmill(stationary bike), Time (Minutes): 10, Frequency (Times/Week): 2, Weekly Exercise (Minutes/Week): 20, Intensity: Mild Exercise limited by: None identified   Past Medical History:  Diagnosis Date  . Allergy   . Arthritis   . Asthma   . GERD (gastroesophageal reflux disease)   . History of abdominal pain    2007- mild decreased GB EF; 2008-collapsing R ovarian cyst; 2012 uterine fibroid x 1.  . HV (hallux valgus)    repaired as child  . Hyperlipidemia   . Prediabetes    Allergies  Allergen Reactions  . Other Anaphylaxis    Seafood    . Amoxicillin Rash  . Shellfish-Derived Products Rash and Swelling   Past Surgical History:  Procedure Laterality Date  . BUNIONECTOMY Right    performed as child   Family History  Problem Relation Age of Onset  . Hypertension Mother   . Alcohol abuse Father   . Glaucoma Maternal Grandmother   . Asthma Maternal Grandfather   . Cancer Paternal Grandfather        lung   Social History   Socioeconomic History  . Marital status: Single    Spouse name: Not on file  . Number of children: 0  .  Years of education: Not on file  . Highest education level: Not on file  Social Needs  . Financial resource strain: Not on file  . Food insecurity - worry: Not on file  . Food insecurity - inability: Not on file  . Transportation needs - medical: Not on file  . Transportation needs - non-medical: Not on file  Occupational History  . Occupation: Pharmacist, hospital    Comment: HS, basketball coach  Tobacco Use  . Smoking status: Never Smoker  . Smokeless tobacco: Never Used  Substance and Sexual Activity  . Alcohol  use: No    Comment: occasional  . Drug use: No  . Sexual activity: Yes  Other Topics Concern  . 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 She lives alone.      ROS: 14 pt review of systems performed and negative (unless mentioned in an HPI)  Objective: BP 129/85 (BP Location: Left Arm, Patient Position: Sitting, Cuff Size: Large)   Pulse 72   Temp 98 F (36.7 C)   Resp 20   Ht '5\' 4"'  (1.626 m)   Wt 174 lb (78.9 kg)   SpO2 100%   BMI 29.87 kg/m  Gen: Afebrile. No acute distress. Nontoxic in appearance, well-developed, well-nourished, African-American female. Mildly overweight. HENT: AT. [place]. Bilateral TM visualized and normal in appearance. MMM. Bilateral nares mild erythema and swelling, no drainage. Throat without erythema or exudates. No cough, no hoarseness. Eyes:Pupils Equal Round Reactive to light, Extraocular movements intact,  Conjunctiva without redness, discharge or icterus. Neck/lymp/endocrine: Supple, no lymphadenopathy, moderate thyromegaly CV: RRR no murmur, no edema, +2/4 P posterior tibialis pulses Chest: CTAB, no wheeze or crackles Abd: Soft. Round. NTND. BS present. No Masses palpated.  Skin: No rashes, purpura or petechiae.  Neuro:  Normal gait. PERLA. EOMi. Alert. Oriented. Cranial nerves II through XII intact. Muscle strength 5/5 upper and lower extremity. DTRs equal bilaterally. Psych: Normal affect, dress and demeanor. Normal speech. Normal thought content and judgment.   Assessment/plan: Ruth [name] is a 46 y.o. female present for annual exam.  Hyperlipidemia LDL goal <130 - Pt has restarted her statin last week, lapse in care from her not having insurance.   - Diet and exercise modifications discussed and encouraged. She has started back at the gym, biking.  - atorvastatin (LIPITOR) 20 MG tablet; Take 1 tablet (20 mg total) by mouth daily.  Dispense: 30 tablet; Refill:  5  Elevated a1c/overweight: Diet and exercise modifications discussed.  - 6.0 a1c today  Thyroid nodule: - Patient follow-up May 2019, at that time will repeat thyroid ultrasound per radiology recommendations.  Encounter for preventive health examination Patient was encouraged to exercise greater than 150 minutes a week. Patient was encouraged to choose a diet filled with fresh fruits and vegetables, and lean meats. AVS provided to patient today for education/recommendation on gender specific health and safety maintenance. Tdap UTD, Flu shot next week after acute illness.  Mammogram and PAP UTD 2018, GYn records requested.  ID screening completed.  Reviewed all labs with pt today.    Return in about 1 year (around 11/16/2017) for CPE.  Follow-up in May on thyromegaly Nurse visit in 1 week for flu shot  Electronically signed by: Howard Pouch, [place]

## 2016-11-16 NOTE — Patient Instructions (Signed)

## 2016-11-23 ENCOUNTER — Encounter: Payer: Self-pay | Admitting: Family Medicine

## 2017-01-05 ENCOUNTER — Ambulatory Visit (INDEPENDENT_AMBULATORY_CARE_PROVIDER_SITE_OTHER): Payer: BC Managed Care – PPO

## 2017-01-05 ENCOUNTER — Encounter: Payer: Self-pay | Admitting: *Deleted

## 2017-01-05 DIAGNOSIS — Z23 Encounter for immunization: Secondary | ICD-10-CM

## 2017-01-17 ENCOUNTER — Ambulatory Visit: Payer: BC Managed Care – PPO | Admitting: Family Medicine

## 2017-01-18 ENCOUNTER — Encounter: Payer: Self-pay | Admitting: Family Medicine

## 2017-01-18 ENCOUNTER — Ambulatory Visit: Payer: BC Managed Care – PPO | Admitting: Family Medicine

## 2017-01-18 VITALS — BP 113/73 | HR 75 | Temp 97.4°F | Wt 174.8 lb

## 2017-01-18 DIAGNOSIS — M7712 Lateral epicondylitis, left elbow: Secondary | ICD-10-CM | POA: Diagnosis not present

## 2017-01-18 DIAGNOSIS — G5602 Carpal tunnel syndrome, left upper limb: Secondary | ICD-10-CM

## 2017-01-18 MED ORDER — WRIST SPLINT/COCK-UP/LEFT M MISC: 0 refills | Status: DC

## 2017-01-18 MED ORDER — MELOXICAM 15 MG PO TABS: 15.0000 mg | ORAL_TABLET | Freq: Every day | ORAL | 1 refills | Status: DC

## 2017-01-18 NOTE — Progress Notes (Signed)
Ruth [name] , 02/24/1970, 47 y.o., female MRN: [id number] Patient Care Team    Relationship Specialty Notifications Start End  [name], DO PCP - General Family Medicine  09/25/14   [name], MD Referring Physician Specialist  06/30/15     Chief Complaint  Patient presents with  . Follow-up    pts c/o of tingling and numbness in her left hand, says it's getting worse.     Subjective:  Left and right wrist pain: Patient returns today to discuss her wrist pain. She states that her left wrist is now more painful, with complaints of tingling and numbness in her left thumb and index. She ran out of meloxicam approximately 2 weeks ago. Prior to that he had been helpful in decreasing her pain, but did not take the pain completely away. Approximate 2 weeks ago she noticed increased discomfort in her left wrist. She had stopped biking, which was initially thought to cause some of the discomfort. Symptoms are worse during sleeping hours. She had been using the sleeve during waking hours and felt that with helpful. She started wearing at nighttime as well, but still not as helpful.   Prior note 11/16/2016: Patient complains of left hand discomfort and numbness tingling in her left index. She is both left-handed and right-handed, but is more right hand dominate. She has been biking as a new increased activity. She feels the numbness and tingling are worse at night, but has started to recur all through the day. She has not tried any medications. Patient complains of right medial wrist/forearm discomfort. Per above she has been biking, otherwise no injury or activity change. She denies any redness, swelling or fever.  Depression screen [facility] 2/9 11/16/2016 11/12/2016 06/30/2015  Decreased Interest 0 0 0  Down, Depressed, Hopeless 0 0 0  PHQ - 2 Score 0 0 0    Allergies  Allergen Reactions  . Other Anaphylaxis    Seafood    . Amoxicillin Rash  . Shellfish-Derived Products Rash  and Swelling   Social History   Tobacco Use  . Smoking status: Never Smoker  . Smokeless tobacco: Never Used  Substance Use Topics  . Alcohol use: No    Comment: occasional   Past Medical History:  Diagnosis Date  . Allergy   . Arthritis   . Asthma   . GERD (gastroesophageal reflux disease)   . History of abdominal pain    2007- mild decreased GB EF; 2008-collapsing R ovarian cyst; 2012 uterine fibroid x 1.  . HV (hallux valgus)    repaired as child  . Hyperlipidemia   . Prediabetes    Past Surgical History:  Procedure Laterality Date  . BUNIONECTOMY Right    performed as child   Family History  Problem Relation Age of Onset  . Hypertension Mother   . Alcohol abuse Father   . Glaucoma Maternal Grandmother   . Asthma Maternal Grandfather   . Cancer Paternal Grandfather        lung   Allergies as of 01/18/2017      Reactions   Other Anaphylaxis   Seafood    Amoxicillin Rash   Shellfish-derived Products Rash, Swelling      Medication List        Accurate as of 01/18/17  4:49 PM. Always use your most recent med list.          atorvastatin 20 MG tablet Commonly known as:  LIPITOR Take 1 tablet (20 mg total)  daily by mouth.   fluticasone 50 MCG/ACT nasal spray Commonly known as:  FLONASE Place 2 sprays daily into both nostrils.   meloxicam 15 MG tablet Commonly known as:  MOBIC Take 1 tablet (15 mg total) by mouth daily.   omeprazole 20 MG capsule Commonly known as:  PRILOSEC Take 1 capsule (20 mg total) by mouth daily.   rizatriptan 10 MG tablet Commonly known as:  MAXALT Take 1 tablet (10 mg total) by mouth as needed for migraine. May repeat in 2 hours if needed   Wrist Splint/Cock-Up/Left [place] for one left carpal tunnel night guard.       All past medical history, surgical history, allergies, family history, immunizations andmedications were updated in the EMR today and reviewed under the history and medication portions of their EMR.       ROS: Negative, with the exception of above mentioned in HPI   Objective:  BP 113/73 (BP Location: Left Arm, Patient Position: Sitting, Cuff Size: Normal)   Pulse 75   Temp (!) 97.4 F (36.3 C) (Oral)   Wt 174 lb 12.8 oz (79.3 kg)   LMP 10/02/2016   SpO2 98%   BMI 30.00 kg/m  Body mass index is 30 kg/m. Gen: Afebrile. No acute distress. Nontoxic in appearance, well-developed, well-nourished, Caucasian female. HENT: AT. [place].  MMM. MSK: Left wrist: No erythema, mild soft tissue swelling today. No bony tenderness. Full range of motion. Positive Tinel's at wrist today (changed from prior exam). Neurovascularly intact distally.   No exam data present No results found. No results found for this or any previous visit (from the past 24 hour(s)).  Assessment/Plan: Ruth [name] is a 47 y.o. female present for OV for  Carpal tunnel syndrome left wrist: - Discussed options with her today. Today symptoms are more consistent with left carpal tunnel. Prior exam was more consistent with epicondylitis. - Restart meloxicam 15 mg daily.  - Left carpal tunnel night splint prescription provided. Patient is to be fitted for this. - Referral to sports medicine for potential ultrasound an injection after further evaluation of discomfort. Patient is agreeable to this today. -Follow-up when necessary here, as long as continuing with sports med for issue.  Reviewed expectations re: course of current medical issues.  Discussed self-management of symptoms.  Outlined signs and symptoms indicating need for more acute intervention.  Patient verbalized understanding and all questions were answered.  Patient received an After-Visit Summary.    Orders Placed This Encounter  Procedures  . Ambulatory referral to Sports Medicine     Note is dictated utilizing voice recognition software. Although note has been proof read prior to signing, occasional typographical errors still can be missed. If any  questions arise, please do not hesitate to call for verification.   electronically signed by:  Howard Pouch, DO  [place]

## 2017-01-18 NOTE — Patient Instructions (Signed)
Carpal Tunnel Syndrome Carpal tunnel syndrome is a condition that causes pain in your hand and arm. The carpal tunnel is a narrow area that is on the palm side of your wrist. Repeated wrist motion or certain diseases may cause swelling in the tunnel. This swelling can pinch the main nerve in the wrist (median nerve). Follow these instructions at home: If you have a splint:  Wear it as told by your doctor. Remove it only as told by your doctor.  Loosen the splint if your fingers: ? Become numb and tingle. ? Turn blue and cold.  Keep the splint clean and dry. General instructions  Take over-the-counter and prescription medicines only as told by your doctor.  Rest your wrist from any activity that may be causing your pain. If needed, talk to your employer about changes that can be made in your work, such as getting a wrist pad to use while typing.  If directed, apply ice to the painful area: ? Put ice in a plastic bag. ? Place a towel between your skin and the bag. ? Leave the ice on for 20 minutes, 2-3 times per day.  Keep all follow-up visits as told by your doctor. This is important.  Do any exercises as told by your doctor, physical therapist, or occupational therapist. Contact a doctor if:  You have new symptoms.  Medicine does not help your pain.  Your symptoms get worse. This information is not intended to replace advice given to you by your health care provider. Make sure you discuss any questions you have with your health care provider. Document Released: 12/10/2010 Document Revised: 05/29/2015 Document Reviewed: 05/08/2014 Elsevier Interactive Patient Education  2018 [employer]  

## 2017-01-25 ENCOUNTER — Institutional Professional Consult (permissible substitution): Payer: BC Managed Care – PPO | Admitting: Sports Medicine

## 2017-02-04 ENCOUNTER — Ambulatory Visit (INDEPENDENT_AMBULATORY_CARE_PROVIDER_SITE_OTHER): Payer: BC Managed Care – PPO | Admitting: Sports Medicine

## 2017-02-04 ENCOUNTER — Encounter: Payer: Self-pay | Admitting: Sports Medicine

## 2017-02-04 DIAGNOSIS — G5602 Carpal tunnel syndrome, left upper limb: Secondary | ICD-10-CM | POA: Diagnosis not present

## 2017-02-04 NOTE — Progress Notes (Signed)
Subjective:    I'm seeing this patient as a consultation for: Dr. 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  For the past several months she has had on and off numbness and tingling in her left hand, worse at night, into the first, second, third, and fourth fingers.  She did have a diagnosis of carpal tunnel syndrome, she was placed in nighttime splinting which provided only minimal relief, almost 10 months ago she had a median nerve hydrodissection by Dr. [name] that was effective, she is now having a recurrence of symptoms and is referred to me for further evaluation and definitive treatment after failure of splinting and NSAIDs.  I reviewed the past medical history, family history, social history, surgical history, and allergies today and no changes were needed.  Please see the problem list section below in epic for further details.  Past Medical History: Past Medical History:  Diagnosis Date  . Allergy   . Arthritis   . Asthma   . GERD (gastroesophageal reflux disease)   . History of abdominal pain    2007- mild decreased GB EF; 2008-collapsing R ovarian cyst; 2012 uterine fibroid x 1.  . HV (hallux valgus)    repaired as child  . Hyperlipidemia   . Prediabetes    Past Surgical History: Past Surgical History:  Procedure Laterality Date  . BUNIONECTOMY Right    performed as child   Social History: Social History   Socioeconomic History  . Marital status: Single    Spouse name: None  . Number of children: 0  . Years of education: None  . Highest education level: None  Social Needs  . Financial resource strain: None  . Food insecurity - worry: None  . Food insecurity - inability: None  . Transportation needs - medical: None  . Transportation needs - non-medical: None  Occupational History  . Occupation: Pharmacist, hospital    Comment: HS, basketball coach  Tobacco Use  . Smoking status:  Never Smoker  . Smokeless tobacco: Never Used  Substance and Sexual Activity  . Alcohol use: No    Comment: occasional  . Drug use: No  . Sexual activity: Yes  Other Topics Concern  . 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 She lives alone.    Family History: Family History  Problem Relation Age of Onset  . Hypertension Mother   . Alcohol abuse Father   . Glaucoma Maternal Grandmother   . Asthma Maternal Grandfather   . Cancer Paternal Grandfather        lung   Allergies: Allergies  Allergen Reactions  . Other Anaphylaxis    Seafood    . Amoxicillin Rash  . Shellfish-Derived Products Rash and Swelling   Medications: See med rec.  Review of Systems: No headache, visual changes, nausea, vomiting, diarrhea, constipation, dizziness, abdominal pain, skin rash, fevers, chills, night sweats, weight loss, swollen lymph nodes, body aches, joint swelling, muscle aches, chest pain, shortness of breath, mood changes, visual or auditory hallucinations.   Objective:   General: Well Developed, well nourished, and in no acute distress.  Neuro:  Extra-ocular muscles intact, able to move all 4 extremities, sensation grossly intact.  Deep tendon reflexes tested were normal. Psych: Alert and oriented, mood congruent with affect. ENT:  Ears and nose appear unremarkable.  Hearing  grossly normal. Neck: Unremarkable overall appearance, trachea midline.  No visible thyroid enlargement. Eyes: Conjunctivae and lids appear unremarkable.  Pupils equal and round. Skin: Warm and dry, no rashes noted.  Cardiovascular: Pulses palpable, no extremity edema. Left wrist: Inspection normal with no visible erythema or swelling. ROM smooth and normal with good flexion and extension and ulnar/radial deviation that is symmetrical with opposite wrist. Palpation is normal over metacarpals, navicular, lunate, and TFCC; tendons without tenderness/  swelling No snuffbox tenderness. No tenderness over Canal of [place]. Strength 5/5 in all directions without pain. Positive Tinel's and phalens signs. Negative Finkelstein sign. Negative Watson's test.  Procedure: Real-time Ultrasound Guided left median nerve hydrodissection Device: GE Logiq E  Verbal informed consent obtained.  Time-out conducted.  Noted no overlying erythema, induration, or other signs of local infection.  Skin prepped in a sterile fashion.  Local anesthesia: Topical Ethyl chloride.  With sterile technique and under real time ultrasound guidance: Using a 25-gauge needle advanced into the carpal tunnel and taking care to avoid intraneural injection I placed medication both superficial to and deep to the median nerve in the carpal tunnel.  A total of 1 cc kenalog 40, 5 cc lidocaine was used. Completed without difficulty  Pain immediately resolved suggesting accurate placement of the medication.  Advised to call if fevers/chills, erythema, induration, drainage, or persistent bleeding.  Images permanently stored and available for review in the ultrasound unit.  Impression: Technically successful ultrasound guided injection.  Impression and Recommendations:   This case required medical decision making of moderate complexity.  Carpal tunnel syndrome on left Left-sided median nerve hydrodissection, failed conservative measures, she did have a median nerve hydrodissection approximately 10 months ago on the left side. Continue nighttime splinting. Return to see me in 1 month. ___________________________________________ Gwen Her. Dianah Field, M.D., ABFM., CAQSM. Primary Care and [place] Instructor of [place] [facility]

## 2017-02-04 NOTE — Assessment & Plan Note (Addendum)
Left-sided median nerve hydrodissection, failed conservative measures, she did have a median nerve hydrodissection approximately 10 months ago on the left side. Continue nighttime splinting. Return to see me in 1 month.

## 2017-03-04 ENCOUNTER — Ambulatory Visit: Payer: BC Managed Care – PPO | Admitting: Sports Medicine

## 2017-03-04 DIAGNOSIS — Z0189 Encounter for other specified special examinations: Secondary | ICD-10-CM

## 2017-05-04 ENCOUNTER — Ambulatory Visit: Payer: BC Managed Care – PPO | Admitting: Family Medicine

## 2017-05-04 DIAGNOSIS — Z0289 Encounter for other administrative examinations: Secondary | ICD-10-CM

## 2017-07-05 ENCOUNTER — Ambulatory Visit (INDEPENDENT_AMBULATORY_CARE_PROVIDER_SITE_OTHER): Payer: BC Managed Care – PPO

## 2017-07-05 ENCOUNTER — Ambulatory Visit: Payer: BC Managed Care – PPO | Admitting: Family Medicine

## 2017-07-05 ENCOUNTER — Encounter: Payer: Self-pay | Admitting: Family Medicine

## 2017-07-05 VITALS — BP 106/86 | HR 64 | Temp 97.8°F | Ht 64.0 in | Wt 166.8 lb

## 2017-07-05 DIAGNOSIS — R079 Chest pain, unspecified: Secondary | ICD-10-CM | POA: Diagnosis not present

## 2017-07-05 DIAGNOSIS — R51 Headache: Secondary | ICD-10-CM | POA: Diagnosis not present

## 2017-07-05 DIAGNOSIS — M542 Cervicalgia: Secondary | ICD-10-CM | POA: Diagnosis not present

## 2017-07-05 DIAGNOSIS — J039 Acute tonsillitis, unspecified: Secondary | ICD-10-CM

## 2017-07-05 DIAGNOSIS — S161XXA Strain of muscle, fascia and tendon at neck level, initial encounter: Secondary | ICD-10-CM

## 2017-07-05 DIAGNOSIS — R519 Headache, unspecified: Secondary | ICD-10-CM

## 2017-07-05 LAB — TSH: TSH: 1.02 u[IU]/mL (ref 0.35–4.50)

## 2017-07-05 LAB — COMPREHENSIVE METABOLIC PANEL
ALT: 15 U/L (ref 0–35)
AST: 18 U/L (ref 0–37)
Albumin: 4.3 g/dL (ref 3.5–5.2)
Alkaline Phosphatase: 96 U/L (ref 39–117)
BUN: 12 mg/dL (ref 6–23)
CO2: 28 mEq/L (ref 19–32)
Calcium: 9.6 mg/dL (ref 8.4–10.5)
Chloride: 104 mEq/L (ref 96–112)
Creatinine, Ser: 0.86 mg/dL (ref 0.40–1.20)
GFR: 90.59 mL/min (ref >60.00)
Glucose, Bld: 98 mg/dL (ref 70–99)
Potassium: 4.2 mEq/L (ref 3.5–5.1)
Sodium: 140 mEq/L (ref 135–145)
Total Bilirubin: 0.2 mg/dL (ref 0.2–1.2)
Total Protein: 7.3 g/dL (ref 6.0–8.3)

## 2017-07-05 LAB — CBC WITH DIFFERENTIAL/PLATELET
Basophils Absolute: 0 10*3/uL (ref 0.0–0.1)
Basophils Relative: 0.8 % (ref 0.0–3.0)
Eosinophils Absolute: 0.1 10*3/uL (ref 0.0–0.7)
Eosinophils Relative: 3.5 % (ref 0.0–5.0)
HCT: 37.9 % (ref 36.0–46.0)
Hemoglobin: 12.2 g/dL (ref 12.0–15.0)
Lymphocytes Relative: 41.7 % (ref 12.0–46.0)
Lymphs Abs: 1.6 10*3/uL (ref 0.7–4.0)
MCHC: 32.2 g/dL (ref 30.0–36.0)
MCV: 81.5 fl (ref 78.0–100.0)
Monocytes Absolute: 0.5 10*3/uL (ref 0.1–1.0)
Monocytes Relative: 13.8 % — ABNORMAL HIGH (ref 3.0–12.0)
Neutro Abs: 1.6 10*3/uL (ref 1.4–7.7)
Neutrophils Relative %: 40.2 % — ABNORMAL LOW (ref 43.0–77.0)
Platelets: 273 10*3/uL (ref 150.0–400.0)
RBC: 4.66 Mil/uL (ref 3.87–5.11)
RDW: 13.3 % (ref 11.5–15.5)
WBC: 3.9 10*3/uL — ABNORMAL LOW (ref 4.0–10.5)

## 2017-07-05 LAB — SEDIMENTATION RATE: Sed Rate: 46 mm/hr — ABNORMAL HIGH (ref 0–20)

## 2017-07-05 NOTE — Progress Notes (Deleted)
Patient: Ruth [name] MRN: [id number] DOB: 01-08-1970 PCP: [name], DO     Subjective:  Chief Complaint  Patient presents with  . Sore Throat    poss tonsil stone on right? referral to ENT?    HPI: The patient is a 47 y.o. female who presents today for ***  Review of Systems  Allergies Patient is allergic to other; amoxicillin; and shellfish-derived products.  Past Medical History Patient  has a past medical history of Allergy, Arthritis, Asthma, GERD (gastroesophageal reflux disease), History of abdominal pain, HV (hallux valgus), Hyperlipidemia, and Prediabetes.  Surgical History Patient  has a past surgical history that includes Bunionectomy (Right).  Family History Pateint's family history includes Alcohol abuse in her father; Asthma in her maternal grandfather; Cancer in her paternal grandfather; Glaucoma in her maternal grandmother; Hypertension in her mother.  Social History Patient  reports that she has never smoked. She has never used smokeless tobacco. She reports that she does not drink alcohol or use drugs.    Objective: Vitals:   07/05/17 1357  BP: 106/86  Pulse: 64  Temp: 97.8 F (36.6 C)  TempSrc: Oral  SpO2: 98%  Weight: 166 lb 12.8 oz (75.7 kg)  Height: 5\' 4"  (1.626 m)    Body mass index is 28.63 kg/m.  Physical Exam     Assessment/plan:      No follow-ups on file.     Briscoe Deutscher, MD [place] [facility] 07/05/2017

## 2017-07-08 ENCOUNTER — Encounter: Payer: Self-pay | Admitting: Family Medicine

## 2017-07-08 NOTE — Progress Notes (Signed)
Ruth [name] is a 47 y.o. female here for an acute visit.  History of Present Illness:   HPI: Patient presents with several concerns today. Chronic right jaw pain. Evaluation by 3 dentists, recently s/p tooth extraction left lower molar. Still with pain. Noted white area on left tonsil last week. NP friend told her to gargle with salt water and it resolved. Right ear pain increased and she was put on an antibiotic. Now finished. Jaw pain improved. Feels like there is something in her throat - "can't cough it up." She is also concerned about left shoulder pain that radiates to her chest. + HA intermittently. 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   PMHx, SurgHx, SocialHx, Medications, and Allergies were reviewed in the Visit Navigator and updated as appropriate.  Current Medications:   Current Outpatient Medications:  .  fluticasone (FLONASE) 50 MCG/ACT nasal spray, Place 2 sprays daily into both nostrils., Disp: 16 g, Rfl: 6 .  atorvastatin (LIPITOR) 20 MG tablet, Take 1 tablet (20 mg total) daily by mouth. (Patient not taking: Reported on 07/05/2017), Disp: 90 tablet, Rfl: 3   Allergies  Allergen Reactions  . Other Anaphylaxis    Seafood    . Amoxicillin Rash  . Shellfish-Derived Products Rash and Swelling   Review of Systems:   Pertinent items are noted in the HPI. Otherwise, ROS is negative.  Vitals:   Vitals:   07/05/17 1357  BP: 106/86  Pulse: 64  Temp: 97.8 F (36.6 C)  TempSrc: Oral  SpO2: 98%  Weight: 166 lb 12.8 oz (75.7 kg)  Height: 5\' 4"  (1.626 m)     Body mass index is 28.63 kg/m.  Physical Exam:   Physical Exam  Constitutional: She appears well-nourished.  HENT:  Head: Normocephalic and atraumatic.  Eyes: Pupils are equal, round, and reactive to light. EOM are normal.  Neck: Normal range of motion. Neck supple.  Cardiovascular: Normal rate, regular rhythm, normal heart sounds and intact distal pulses.  Pulmonary/Chest: Effort normal.  Abdominal: Soft.    Musculoskeletal:       Cervical back: She exhibits decreased range of motion, bony tenderness and spasm.  Skin: Skin is warm.  Psychiatric: She has a normal mood and affect. Her behavior is normal.  Nursing note and vitals reviewed.   Results for orders placed or performed in visit on 07/05/17  CBC with Differential/Platelet  Result Value Ref Range   WBC 3.9 (L) 4.0 - 10.5 K/uL   RBC 4.66 3.87 - 5.11 Mil/uL   Hemoglobin 12.2 12.0 - 15.0 g/dL   HCT 37.9 36.0 - 46.0 %   MCV 81.5 78.0 - 100.0 fl   MCHC 32.2 30.0 - 36.0 g/dL   RDW 13.3 11.5 - 15.5 %   Platelets 273.0 150.0 - 400.0 K/uL   Neutrophils Relative % 40.2 (L) 43.0 - 77.0 %   Lymphocytes Relative 41.7 12.0 - 46.0 %   Monocytes Relative 13.8 (H) 3.0 - 12.0 %   Eosinophils Relative 3.5 0.0 - 5.0 %   Basophils Relative 0.8 0.0 - 3.0 %   Neutro Abs 1.6 1.4 - 7.7 K/uL   Lymphs Abs 1.6 0.7 - 4.0 K/uL   Monocytes Absolute 0.5 0.1 - 1.0 K/uL   Eosinophils Absolute 0.1 0.0 - 0.7 K/uL   Basophils Absolute 0.0 0.0 - 0.1 K/uL  Comprehensive metabolic panel  Result Value Ref Range   Sodium 140 135 - 145 mEq/L   Potassium 4.2 3.5 - 5.1 mEq/L   Chloride 104 96 -  112 mEq/L   CO2 28 19 - 32 mEq/L   Glucose, Bld 98 70 - 99 mg/dL   BUN 12 6 - 23 mg/dL   Creatinine, Ser 0.86 0.40 - 1.20 mg/dL   Total Bilirubin 0.2 0.2 - 1.2 mg/dL   Alkaline Phosphatase 96 39 - 117 U/L   AST 18 0 - 37 U/L   ALT 15 0 - 35 U/L   Total Protein 7.3 6.0 - 8.3 g/dL   Albumin 4.3 3.5 - 5.2 g/dL   Calcium 9.6 8.4 - 10.5 mg/dL   GFR 90.59 >60.00 mL/min  TSH  Result Value Ref Range   TSH 1.02 0.35 - 4.50 uIU/mL  Sedimentation rate  Result Value Ref Range   Sed Rate 46 (H) 0 - 20 mm/hr   EKG: sinus bradycardia.   Assessment and Plan:   Ruth [name] was seen today for sore throat.  Diagnoses and all orders for this visit:  Chest pain, unspecified type -     EKG 12-Lead  Cervical strain, initial encounter  Neck pain -     DG Cervical Spine 2 or 3  views; Future  Tonsillitis -     Ambulatory referral to ENT  Intractable headache, unspecified chronicity pattern, unspecified headache type -     CBC with Differential/Platelet -     Comprehensive metabolic panel -     TSH -     Sedimentation rate    . Reviewed expectations re: course of current medical issues. . Discussed self-management of symptoms. . Outlined signs and symptoms indicating need for more acute intervention. . Patient verbalized understanding and all questions were answered. Marland Kitchen Health Maintenance issues including appropriate healthy diet, exercise, and smoking avoidance were discussed with patient. . See orders for this visit as documented in the electronic medical record. . Patient received an After Visit Summary.   Briscoe Deutscher, DO [place], [facility] 07/08/2017

## 2017-07-11 ENCOUNTER — Ambulatory Visit: Payer: BC Managed Care – PPO | Admitting: Family Medicine

## 2017-10-10 ENCOUNTER — Ambulatory Visit: Payer: BC Managed Care – PPO | Admitting: Family Medicine

## 2017-10-10 ENCOUNTER — Encounter: Payer: Self-pay | Admitting: Family Medicine

## 2017-10-10 VITALS — BP 117/79 | HR 69 | Temp 98.2°F | Resp 20 | Ht 64.0 in | Wt 164.5 lb

## 2017-10-10 DIAGNOSIS — E042 Nontoxic multinodular goiter: Secondary | ICD-10-CM | POA: Diagnosis not present

## 2017-10-10 DIAGNOSIS — J029 Acute pharyngitis, unspecified: Secondary | ICD-10-CM | POA: Diagnosis not present

## 2017-10-10 LAB — POCT RAPID STREP A (OFFICE): RAPID STREP A SCREEN: NEGATIVE

## 2017-10-10 MED ORDER — METHYLPREDNISOLONE ACETATE 80 MG/ML IJ SUSP
80.0000 mg | Freq: Once | INTRAMUSCULAR | Status: AC
  Administered 2017-10-10: 80 mg via INTRAMUSCULAR

## 2017-10-10 NOTE — Addendum Note (Signed)
Addended by: Leota Jacobsen on: 10/10/2017 04:51 PM   Modules accepted: Orders

## 2017-10-10 NOTE — Patient Instructions (Addendum)
Continue flonase daily.  Use nasal saline daily for moisture. Continue ointment to the nose.  Strep test is negative today.  This is likely viral or another tonsil stone I can not see. Antibiotics would not be helpful for a viral infection.  Salt water gargles are helpful.   I will send it for formal culture as well to be certain. If positive will prescribed antibiotic for you.   I ordered the thyroid ultrasound for 12 month repeat.    Pharyngitis Pharyngitis is a sore throat (pharynx). There is redness, pain, and swelling of your throat. Follow these instructions at home:  Drink enough fluids to keep your pee (urine) clear or pale yellow.  Only take medicine as told by your doctor. ? You may get sick again if you do not take medicine as told. Finish your medicines, even if you start to feel better. ? Do not take aspirin.  Rest.  Rinse your mouth (gargle) with salt water ( tsp of salt per 1 qt of water) every 1-2 hours. This will help the pain.  If you are not at risk for choking, you can suck on hard candy or sore throat lozenges. Contact a doctor if:  You have large, tender lumps on your neck.  You have a rash.  You cough up green, yellow-brown, or bloody spit. Get help right away if:  You have a stiff neck.  You drool or cannot swallow liquids.  You throw up (vomit) or are not able to keep medicine or liquids down.  You have very bad pain that does not go away with medicine.  You have problems breathing (not from a stuffy nose). This information is not intended to replace advice given to you by your health care provider. Make sure you discuss any questions you have with your health care provider. Document Released: 06/09/2007 Document Revised: 05/29/2015 Document Reviewed: 08/28/2012 Elsevier Interactive Patient Education  2017 [employer].

## 2017-10-10 NOTE — Progress Notes (Signed)
Ruth [name] , November 13, 1970, 47 y.o., female MRN: [id number] Patient Care Team    Relationship Specialty Notifications Start End  Ruth Hillock, DO PCP - General Family Medicine  09/25/14   Ruth Other, MD Referring Physician Specialist  06/30/15     Chief Complaint  Patient presents with  . Sore Throat    dry throat,burning      Subjective:  Pharyngitis, unspecified etiology Pt reports dry throat, burning throat on right side. This condition has been recurrent. She has been evaluated by ENT, which felt it may be tonsil stones. She had stopped her PPI 2/2 to diarrhea. She states it did help with GERD symptoms, but not when she gets the pain like it is currently. She feels that she may had some fever/chills today. She also endorses mild nausea, abd discomfort and vomit Friday. She has been using Flonase daily.   Multiple thyroid nodules Multiple nodules. Left mid bx and was benign. 12 month Repeat due. Tsh and T3 have been normal.  - US THYROID; Future   Depression screen [facility] 2/9 11/16/2016 11/12/2016 06/30/2015  Decreased Interest 0 0 0  Down, Depressed, Hopeless 0 0 0  PHQ - 2 Score 0 0 0    Allergies  Allergen Reactions  . [name] Anaphylaxis    Seafood    . Amoxicillin Rash  . Shellfish-Derived Products Rash and Swelling   Social History   Tobacco Use  . Smoking status: Never Smoker  . Smokeless tobacco: Never Used  Substance Use Topics  . Alcohol use: No    Comment: occasional   Past Medical History:  Diagnosis Date  . Allergy   . Arthritis   . Asthma   . GERD (gastroesophageal reflux disease)   . History of abdominal pain    2007- mild decreased GB EF; 2008-collapsing R ovarian cyst; 2012 uterine fibroid x 1.  . HV (hallux valgus)    repaired as child  . Hyperlipidemia   . Prediabetes    Past Surgical History:  Procedure Laterality Date  . BUNIONECTOMY Right    performed as child   Family History  Problem Relation Age of Onset  .  Hypertension Mother   . Alcohol abuse Father   . Glaucoma Maternal Grandmother   . Asthma Maternal Grandfather   . Cancer Paternal Grandfather        lung   Allergies as of 10/10/2017      Reactions   [name] Anaphylaxis   Seafood    Amoxicillin Rash   Shellfish-derived Products Rash, Swelling      Medication List        Accurate as of 10/10/17  4:34 PM. Always use your most recent med list.          fluticasone 50 MCG/ACT nasal spray Commonly known as:  FLONASE Place 2 sprays daily into both nostrils.       All past medical history, surgical history, allergies, family history, immunizations andmedications were updated in the EMR today and reviewed under the history and medication portions of their EMR.     ROS: Negative, with the exception of above mentioned in HPI   Objective:  BP 117/79 (BP Location: Left Arm, Patient Position: Sitting, Cuff Size: Normal)   Pulse 69   Temp 98.2 F (36.8 C)   Resp 20   Ht 5\' 4"  (1.626 m)   Wt 164 lb 8 oz (74.6 kg)   SpO2 97%   BMI 28.24 kg/m  Body mass index  is 28.24 kg/m. Gen: Afebrile. No acute distress. Nontoxic in appearance, well developed, well nourished.  HENT: AT. [place]. Bilateral TM visualized with mild fullness LEFT, right TM normal. MMM, no oral lesions. Bilateral nares with mild erythema and swelling. No drainage noted.  Mildly enlarged red tonsils, no exudates. No cough or hoarseness.  Eyes:Pupils Equal Round Reactive to light, Extraocular movements intact,  Conjunctiva without redness, discharge or icterus. Neck/lymp/endocrine: Supple,right TTP anterior cervical lymph node x1 CV: RRR  Chest: CTAB, no wheeze or crackles. Good air movement, normal resp effort.  Skin: no rashes, purpura or petechiae.  Neuro:  Normal gait. PERLA. EOMi. Alert. Oriented x3   No exam data present No results found. Results for orders placed or performed in visit on 10/10/17 (from the past 24 hour(s))  POCT rapid strep A     Status: Normal     Collection Time: 10/10/17  4:22 PM  Result Value Ref Range   Rapid Strep A Screen Negative Negative    Assessment/Plan: Ruth [name] is a 47 y.o. female present for OV for  Pharyngitis, unspecified etiology Her constellation of symptoms suggest poss viral etiology. Although pain I similar to when she expressed a tonsil stone. - IM depo medrol provided today to help antiinflammatory action and hopefully provided some comfort.  - continue flonase. Add nasal saline daily and salt water gargles.  - if culture + will prescribed abx for her.  - Upper Respiratory Culture - POCT rapid strep A--> negative - f/u PRN, if worsening and lab normal, consider referral back to ENT.  Multiple thyroid nodules Multiple nodules. Left mid bx and was benign. 12 month Repeat due. Tsh and T3 have been normal.  - US THYROID; Future    Reviewed expectations re: course of current medical issues.  Discussed self-management of symptoms.  Outlined signs and symptoms indicating need for more acute intervention.  Patient verbalized understanding and all questions were answered.  Patient received an After-Visit Summary.   > 25 minutes spent with patient, >50% of time spent face to face counseling and coordinating care.    Orders Placed This Encounter  Procedures  . Upper Respiratory Culture  . US THYROID  . POCT rapid strep A     Note is dictated utilizing voice recognition software. Although note has been proof read prior to signing, occasional typographical errors still can be missed. If any questions arise, please do not hesitate to call for verification.   electronically signed by:  Ruth Pouch, DO  Ruth [name]

## 2017-10-13 LAB — CULTURE, UPPER RESPIRATORY
MICRO NUMBER:: 91202620
SPECIMEN QUALITY:: ADEQUATE

## 2017-10-22 ENCOUNTER — Ambulatory Visit (HOSPITAL_BASED_OUTPATIENT_CLINIC_OR_DEPARTMENT_OTHER): Admission: RE | Admit: 2017-10-22 | Payer: BC Managed Care – PPO | Source: Ambulatory Visit

## 2017-10-26 ENCOUNTER — Telehealth: Payer: Self-pay | Admitting: Family Medicine

## 2017-10-26 NOTE — Telephone Encounter (Signed)
Patient is going to contact her insurance company to find out if she has to meet her deductible.

## 2017-10-26 NOTE — Telephone Encounter (Signed)
Received price billed to insurance for [place] of $285 from [facility] with a reading fee which billed directly by the radiologist.  Left a message for the patient to CB.

## 2018-01-17 ENCOUNTER — Encounter: Payer: Self-pay | Admitting: Family Medicine

## 2018-01-17 ENCOUNTER — Ambulatory Visit: Payer: BC Managed Care – PPO | Admitting: Family Medicine

## 2018-01-17 VITALS — BP 124/82 | HR 98 | Temp 98.3°F | Resp 16 | Ht 64.0 in | Wt 172.0 lb

## 2018-01-17 DIAGNOSIS — Z23 Encounter for immunization: Secondary | ICD-10-CM

## 2018-01-17 DIAGNOSIS — R0989 Other specified symptoms and signs involving the circulatory and respiratory systems: Secondary | ICD-10-CM | POA: Diagnosis not present

## 2018-01-17 DIAGNOSIS — J039 Acute tonsillitis, unspecified: Secondary | ICD-10-CM | POA: Diagnosis not present

## 2018-01-17 MED ORDER — METHYLPREDNISOLONE ACETATE 80 MG/ML IJ SUSP
80.0000 mg | Freq: Once | INTRAMUSCULAR | Status: AC
  Administered 2018-01-17: 80 mg via INTRAMUSCULAR

## 2018-01-17 NOTE — Progress Notes (Signed)
Ruth [name] , 01/27/1970, 48 y.o., female MRN: [id number] Patient Care Team    Relationship Specialty Notifications Start End  [name], DO PCP - General Family Medicine  09/25/14   [name], MD Referring Physician Specialist  06/30/15     Chief Complaint  Patient presents with  . Sore Throat    x 2 wks, on and off, tonsil on Rt side is swollen     Subjective: Pt presents for an OV with complaints of right sided sore throat of 2 weeks duration.  Associated symptoms include patient reports her right tonsil appears red and swollen.  She denies fevers, chills, nausea or vomit.  She denies rash.  Reports the pain is similar to what she has experienced in the past, last seen in October for similar issue with a negative strep.  She was treated with a steroid injection to help with discomfort which seemed to improve her symptoms at least for a period of time.  She has been seen by ENT, which felt she may have tonsillolith-not confirmed.  She is frustrated and would like another opinion.  She states she could understand that diagnosis if she ever saw or actually felt a tonsil stone.  Depression screen [facility] 2/9 10/10/2017 11/16/2016 11/12/2016 06/30/2015  Decreased Interest 0 0 0 0  Down, Depressed, Hopeless 0 0 0 0  PHQ - 2 Score 0 0 0 0    Allergies  Allergen Reactions  . Other Anaphylaxis    Seafood    . Amoxicillin Rash  . Shellfish-Derived Products Rash and Swelling   Social History   Tobacco Use  . Smoking status: Never Smoker  . Smokeless tobacco: Never Used  Substance Use Topics  . Alcohol use: No    Comment: occasional   Past Medical History:  Diagnosis Date  . Allergy   . Arthritis   . Asthma   . GERD (gastroesophageal reflux disease)   . History of abdominal pain    2007- mild decreased GB EF; 2008-collapsing R ovarian cyst; 2012 uterine fibroid x 1.  . HV (hallux valgus)    repaired as child  . Hyperlipidemia   . Prediabetes    Past Surgical  History:  Procedure Laterality Date  . BUNIONECTOMY Right    performed as child   Family History  Problem Relation Age of Onset  . Hypertension Mother   . Alcohol abuse Father   . Glaucoma Maternal Grandmother   . Asthma Maternal Grandfather   . Cancer Paternal Grandfather        lung   Allergies as of 01/17/2018      Reactions   Other Anaphylaxis   Seafood    Amoxicillin Rash   Shellfish-derived Products Rash, Swelling      Medication List       Accurate as of January 17, 2018  6:12 PM. Always use your most recent med list.        fluticasone 50 MCG/ACT nasal spray Commonly known as:  FLONASE Place 2 sprays daily into both nostrils.       All past medical history, surgical history, allergies, family history, immunizations andmedications were updated in the EMR today and reviewed under the history and medication portions of their EMR.     ROS: Negative, with the exception of above mentioned in HPI   Objective:  BP 124/82 (BP Location: Left Arm, Patient Position: Sitting, Cuff Size: Large)   Pulse 98   Temp 98.3 F (36.8 C) (Oral)  Resp 16   Ht 5\' 4"  (1.626 m)   Wt 172 lb (78 kg)   LMP 12/24/2017   SpO2 99%   BMI 29.52 kg/m  Body mass index is 29.52 kg/m. Gen: Afebrile. No acute distress. Nontoxic in appearance, well developed, well nourished.  HENT: AT. [place]. Bilateral TM visualized no erythema or fullness. MMM, no oral lesions. Bilateral nares without erythema, swelling or drainage. Throat without erythema or exudates.  Mildly enlarged right tonsil with mild erythema, no exudates, no stones. Eyes:Pupils Equal Round Reactive to light, Extraocular movements intact,  Conjunctiva without redness, discharge or icterus. Neck/lymp/endocrine: Supple, no lymphadenopathy-she is tender right submandibular. CV: RRR  Chest: CTAB, no wheeze or crackles.  Skin: No rashes, purpura or petechiae.    No exam data present No results found. No results found for this or any  previous visit (from the past 24 hour(s)).  Assessment/Plan: INIS BORNEMAN is a 48 y.o. female present for OV for  Tonsil pain/Tonsillitis Exam her right tonsil is mildly red and enlarged, with normal left tonsil.  Tender to palpation right submandibular area.  Respiratory culture/throat culture completed today.  If positive will treat accordingly. -IM Depo-Medrol injection provided today, salt water gargles encouraged.  Use Flonase. - Ambulatory referral to ENT for another opinion. - Upper Respiratory Culture  Immunization due - Flu Vaccine QUAD 6+ mos PF IM (Fluarix Quad PF)   Reviewed expectations re: course of current medical issues.  Discussed self-management of symptoms.  Outlined signs and symptoms indicating need for more acute intervention.  Patient verbalized understanding and all questions were answered.  Patient received an After-Visit Summary.    Orders Placed This Encounter  Procedures  . Upper Respiratory Culture  . Flu Vaccine QUAD 6+ mos PF IM (Fluarix Quad PF)  . Ambulatory referral to ENT     Note is dictated utilizing voice recognition software. Although note has been proof read prior to signing, occasional typographical errors still can be missed. If any questions arise, please do not hesitate to call for verification.   electronically signed by:  Howard Pouch, DO  [place]

## 2018-01-17 NOTE — Patient Instructions (Addendum)
I have referred you to an ENT for a second opinion.   Steroid shot, throat culture today. If culture positive will treat with antibiotics. We will call you either way.     Tonsillitis  Tonsillitis is an infection of the throat that causes the tonsils to become red, tender, and swollen. Tonsils are tissues in the back of your throat. Each tonsil has crevices (crypts). Tonsils normally work to protect the body from infection. What are the causes? Sudden (acute) tonsillitis may be caused by a virus or bacteria, including streptococcal bacteria. Long-lasting (chronic) tonsillitis occurs when the crypts of the tonsils become filled with pieces of food and bacteria, which makes it easy for the tonsils to become repeatedly infected. Tonsillitis can be spread from person to person (is contagious). It may be spread by inhaling droplets that are released with coughing or sneezing. You may also come into contact with viruses or bacteria on surfaces, such as cups or utensils. What are the signs or symptoms? Symptoms of this condition include:  A sore throat. This may include trouble swallowing.  White patches on the tonsils.  Swollen tonsils.  Fever.  Headache.  Tiredness.  Loss of appetite.  Snoring during sleep when you did not snore before.  Small, foul-smelling, yellowish-white pieces of material (tonsilloliths) that you occasionally cough up or spit out. These can cause you to have bad breath. How is this diagnosed? This condition is diagnosed with a physical exam. Diagnosis can be confirmed with the results of lab tests, including a throat culture. How is this treated? Treatment for this condition depends on the cause, but usually focuses on treating the symptoms associated with it. Treatment may include:  Medicines to relieve pain and manage fever.  Steroid medicines to reduce swelling.  Antibiotic medicines if the condition is caused by bacteria. If attacks of tonsillitis are  severe and frequent, your health care provider may recommend surgery to remove the tonsils (tonsillectomy). Follow these instructions at home: Medicines  Take over-the-counter and prescription medicines only as told by your health care provider.  If you were prescribed an antibiotic medicine, take it as told by your health care provider. Do not stop taking the antibiotic even if you start to feel better. Eating and drinking  Drink enough fluid to keep your urine clear or pale yellow.  While your throat is sore, eat soft or liquid foods, such as sherbet, soups, or instant breakfast drinks.  Drink warm liquids.  Eat frozen ice pops. General instructions  Rest as much as possible and get plenty of sleep.  Gargle with a salt-water mixture 3-4 times a day or as needed. To make a salt-water mixture, completely dissolve -1 tsp of salt in 1 cup of warm water.  Wash your hands regularly with soap and water. If soap and water are not available, use hand sanitizer.  Do not share cups, bottles, or other utensils until your symptoms have gone away.  Do not smoke. This can help your symptoms and prevent the infection from coming back. If you need help quitting, ask your health care provider.  Keep all follow-up visits as told by your health care provider. This is important. Contact a health care provider if:  You notice large, tender lumps in your neck that were not there before.  You have a fever that does not go away after 2-3 days.  You develop a rash.  You cough up a green, yellow-brown, or bloody substance.  You cannot swallow liquids or food  for 24 hours.  Only one of your tonsils is swollen. Get help right away if:  You develop any new symptoms, such as vomiting, severe headache, stiff neck, chest pain, trouble breathing, or trouble swallowing.  You have severe throat pain along with drooling or voice changes.  You have severe pain that is not controlled with  medicines.  You cannot fully open your mouth.  You develop redness, swelling, or severe pain anywhere in your neck. Summary  Tonsillitis is an infection of the throat that causes the tonsils to become red, tender, and swollen.  Tonsillitis may be caused by a virus or bacteria.  Rest as much as possible. Get plenty of sleep. This information is not intended to replace advice given to you by your health care provider. Make sure you discuss any questions you have with your health care provider. Document Released: 09/30/2004 Document Revised: 01/27/2016 Document Reviewed: 01/27/2016 Elsevier Interactive Patient Education  2019 [employer].

## 2018-01-19 LAB — CULTURE, UPPER RESPIRATORY
MICRO NUMBER:: 53070
SPECIMEN QUALITY:: ADEQUATE

## 2018-04-10 ENCOUNTER — Ambulatory Visit (INDEPENDENT_AMBULATORY_CARE_PROVIDER_SITE_OTHER): Payer: BC Managed Care – PPO | Admitting: Family Medicine

## 2018-04-10 ENCOUNTER — Encounter: Payer: Self-pay | Admitting: Family Medicine

## 2018-04-10 ENCOUNTER — Other Ambulatory Visit: Payer: Self-pay

## 2018-04-10 DIAGNOSIS — J320 Chronic maxillary sinusitis: Secondary | ICD-10-CM | POA: Diagnosis not present

## 2018-04-10 MED ORDER — LEVOFLOXACIN 500 MG PO TABS: 500.0000 mg | ORAL_TABLET | Freq: Every day | ORAL | 0 refills | Status: DC

## 2018-04-10 MED ORDER — PREDNISONE 50 MG PO TABS: 50.0000 mg | ORAL_TABLET | Freq: Every day | ORAL | 0 refills | Status: DC

## 2018-04-10 MED ORDER — FLUTICASONE PROPIONATE 50 MCG/ACT NA SUSP: 2.0000 | Freq: Every day | NASAL | 6 refills | Status: DC

## 2018-04-10 NOTE — Progress Notes (Signed)
   Virtual Visit via Video   I connected with XIMENA TODARO on 04/10/18 at  1:30 PM EDT by a video enabled telemedicine application and verified that I am speaking with the correct person using two identifiers. Location patient: Home Location provider: [facility], Office Persons participating in the virtual visit: Patient, Dr. Raoul Pitch and R.Baker, LPN  I discussed the limitations of evaluation and management by telemedicine and the availability of in person appointments. The patient expressed understanding and agreed to proceed.  Subjective:   Chief Complaint  Patient presents with  . Facial Pain    Right side of face, neck, and head hurts x1 week. Yesterday it got worse. Pt was given abx by ENT and then was told to start Allegra D daily.   Marland Kitchen Headache  . Sore Throat    HPI:  Patient presents today with complaints of right-sided facial pain and headache of one-week duration.  Reports her symptoms started becoming worse yesterday.  SHe is taking Flonase daily-she does need refills of this prescription.  She denies any fever, chills, nausea or vomit.  She does admit to right sided facial pain above her right eye and at her right cheekbone.  This is also causing right ear discomfort.  She is using Allegra-D, had been controlling the symptoms since February when she saw ENT.  At that time she also had a sinus infection and was prescribed doxycycline by her ENT.   ROS: See pertinent positives and negatives per HPI.  Patient Active Problem List   Diagnosis Date Noted  . Tonsillitis 01/17/2018  . Tonsil pain 01/17/2018  . Eustachian tube dysfunction, bilateral 11/12/2016  . Lateral epicondylitis of left elbow 11/12/2016  . Right wrist pain 11/12/2016  . Thyromegaly 05/12/2016  . Perimenopause 05/12/2016  . Thyroid nodule 05/12/2016  . Carpal tunnel syndrome on left 04/06/2016  . Hyperlipidemia LDL goal <130 06/30/2015  . GERD (gastroesophageal reflux disease) 06/16/2015  .  Hot flashes 06/11/2014  . Vitamin D deficiency 05/10/2014    Social History   Tobacco Use  . Smoking status: Never Smoker  . Smokeless tobacco: Never Used  Substance Use Topics  . Alcohol use: No    Comment: occasional    Current Outpatient Medications:  .  fluticasone (FLONASE) 50 MCG/ACT nasal spray, Place 2 sprays daily into both nostrils., Disp: 16 g, Rfl: 6  Allergies  Allergen Reactions  . Other Anaphylaxis    Seafood    . Amoxicillin Rash  . Shellfish-Derived Products Rash and Swelling    Objective:  There were no vitals taken for this visit. Gen: No acute distress. Nontoxic in appearance.  Pleasant African-American female. HENT: AT. [place].  MMM.  Nasal congestion present. Eyes: Conjunctiva without redness, discharge or icterus. Chest: Cough or shortness of breath not present. Skin: No rashes, purpura or petechiae.  Neuro: Alert. Oriented x3  Psych: Normal affect, dress and demeanor. Normal speech. Normal thought content and judgment.   Assessment and Plan:  ADAIAH JASKOT is a 48 y.o. female present Maxillary sinusitis, unspecified chronicity Rest, hydrate.  + flonase (prescribed), mucinex (DM if cough), nettie pot or nasal saline.  Prednisone and LQ 500 x 7 days (recent doxy use) prescribed, take until completed.  Continue allegra- D If cough present it can last up to 6-8 weeks.  F/U 2 weeks of not improved.      Howard Pouch, DO 04/10/2018

## 2018-04-10 NOTE — Patient Instructions (Addendum)
  Rest, hydrate.  + flonase (prescribed), mucinex (DM if cough), nettie pot or nasal saline.  Prednisone and LQ 500 x 7 days  prescribed, take until completed.  Continue allegra- D If cough present it can last up to 6-8 weeks.  F/U 2 weeks of not improved.

## 2018-10-17 ENCOUNTER — Ambulatory Visit (INDEPENDENT_AMBULATORY_CARE_PROVIDER_SITE_OTHER): Payer: BC Managed Care – PPO

## 2018-10-17 ENCOUNTER — Other Ambulatory Visit: Payer: Self-pay

## 2018-10-17 DIAGNOSIS — Z23 Encounter for immunization: Secondary | ICD-10-CM

## 2018-10-24 ENCOUNTER — Other Ambulatory Visit: Payer: Self-pay

## 2018-10-24 ENCOUNTER — Encounter: Payer: Self-pay | Admitting: Family Medicine

## 2018-10-24 ENCOUNTER — Ambulatory Visit (INDEPENDENT_AMBULATORY_CARE_PROVIDER_SITE_OTHER): Payer: BC Managed Care – PPO | Admitting: Family Medicine

## 2018-10-24 VITALS — Temp 96.3°F | Ht 64.0 in

## 2018-10-24 DIAGNOSIS — G5602 Carpal tunnel syndrome, left upper limb: Secondary | ICD-10-CM

## 2018-10-24 DIAGNOSIS — H6983 Other specified disorders of Eustachian tube, bilateral: Secondary | ICD-10-CM

## 2018-10-24 NOTE — Progress Notes (Signed)
VIRTUAL VISIT VIA VIDEO  I connected with Ruth [name] on 10/24/18 at  2:30 PM EDT by a video enabled telemedicine application and verified that I am speaking with the correct person using two identifiers. Location patient: Home Location provider: [facility], Office Persons participating in the virtual visit: Patient, Dr. Raoul Pitch and R.Baker, LPN  I discussed the limitations of evaluation and management by telemedicine and the availability of in person appointments. The patient expressed understanding and agreed to proceed.   SUBJECTIVE Chief Complaint  Patient presents with  . Sore Throat    Right sided sore throat, jaw pain, and ear pain. Hard to sleep.   . Hand Pain    Pt continues to have Left hand numbness and pain     HPI: Ruth [name] is 48 y.o. female present for sore throat, right-sided neck/jaw pain in right-sided ear pain.  She has had similar presentation multiple times in the past.  She has been seen by ENT.  She reports she is taking her Flonase daily.  She is using her sinus lavage daily.  She is not currently taking the Allegra-D.  She denies nausea, vomit, fever, chills, headache or upper respiratory symptoms. She also complains of recurrent left hand numbness that has started over the last few weeks.  She has carpal tunnel syndrome which has waxed and waned in intensity over the last few years.  She did just start wearing her arm brace again.  In the past she had taken meloxicam during flares, she has not started this yet but states she does have the medication at home.  She has seen sports medicine for this and has had injections in the past.  ROS: See pertinent positives and negatives per HPI.  Patient Active Problem List   Diagnosis Date Noted  . Tonsillitis 01/17/2018  . Tonsil pain 01/17/2018  . Eustachian tube dysfunction, bilateral 11/12/2016  . Lateral epicondylitis of left elbow 11/12/2016  . Right wrist pain 11/12/2016  . Thyromegaly  05/12/2016  . Perimenopause 05/12/2016  . Thyroid nodule 05/12/2016  . Carpal tunnel syndrome on left 04/06/2016  . Hyperlipidemia LDL goal <130 06/30/2015  . GERD (gastroesophageal reflux disease) 06/16/2015  . Hot flashes 06/11/2014  . Vitamin D deficiency 05/10/2014    Social History   Tobacco Use  . Smoking status: Never Smoker  . Smokeless tobacco: Never Used  Substance Use Topics  . Alcohol use: No    Comment: occasional    Current Outpatient Medications:  .  fluticasone (FLONASE) 50 MCG/ACT nasal spray, Place 2 sprays into both nostrils daily., Disp: 16 g, Rfl: 6 .  fexofenadine-pseudoephedrine (ALLEGRA-D 24) 180-240 MG 24 hr tablet, Take 1 tablet by mouth daily., Disp: , Rfl:  .  levofloxacin (LEVAQUIN) 500 MG tablet, Take 1 tablet (500 mg total) by mouth daily., Disp: 7 tablet, Rfl: 0 .  predniSONE (DELTASONE) 50 MG tablet, Take 1 tablet (50 mg total) by mouth daily with breakfast., Disp: 5 tablet, Rfl: 0  Allergies  Allergen Reactions  . Other Anaphylaxis    Seafood    . Amoxicillin Rash  . Shellfish-Derived Products Rash and Swelling    OBJECTIVE: Temp (!) 96.3 F (35.7 C) (Oral)   Ht 5\' 4"  (1.626 m)   LMP 01/23/2018   BMI 29.52 kg/m  Gen: No acute distress. Nontoxic in appearance.  HENT: AT. [place].  MMM.  Eyes:Pupils Equal Round Reactive to light, Extraocular movements intact,  Conjunctiva without redness, discharge or icterus. Chest: Cough  or shortness of breath not present Skin: No rashes, purpura or petechiae.  Neuro:  Normal gait. Alert. Oriented x3  Psych: Normal affect, dress and demeanor. Normal speech. Normal thought content and judgment.  ASSESSMENT AND PLAN: Ruth [name] is a 48 y.o. female present for  Carpal tunnel syndrome on left Symptoms are consistent with prior carpal tunnel history.  Encouraged her to start wearing her night splints every night.  Restart meloxicam once daily with food. If she is not seeing any improvement in 2 -4  weeks, or if symptoms are worsening she is encouraged to begin appointment to follow-up with Dr. Arnette Felts medicine.  She should already be established with him and not need another referral.  She is agreeable to plan.  Eustachian tube dysfunction, bilateral Symptoms are consistent with prior eustachian tube dysfunction. Continue Flonase and lavage. Restart Allegra-D. She declines prednisone pack today and would rather have IM Depo-Medrol shot.  She will be scheduled by nurse visit tomorrow to have Depo-Medrol 80 mg IM. If symptoms are not improved within 2 weeks follow-up.   > 15 minutes spent with patient, >50% of time spent face to face       Howard Pouch, DO 10/24/2018

## 2018-10-25 ENCOUNTER — Ambulatory Visit (INDEPENDENT_AMBULATORY_CARE_PROVIDER_SITE_OTHER): Payer: BC Managed Care – PPO | Admitting: Family Medicine

## 2018-10-25 ENCOUNTER — Other Ambulatory Visit: Payer: Self-pay

## 2018-10-25 DIAGNOSIS — H6983 Other specified disorders of Eustachian tube, bilateral: Secondary | ICD-10-CM | POA: Diagnosis not present

## 2018-10-25 MED ORDER — METHYLPREDNISOLONE ACETATE 80 MG/ML IJ SUSP
80.0000 mg | Freq: Once | INTRAMUSCULAR | Status: AC
  Administered 2018-10-25: 80 mg via INTRAMUSCULAR

## 2018-10-25 NOTE — Progress Notes (Addendum)
Ruth [name] is a 48 y.o. female presents to the office today for depo-medrol 80mg  injections, per physician's orders. Original order: 10/24/2018 Depo-medrol 80mg  IM was administered left upper outter quad today. Patient tolerated injection. Patient due for follow up labs/provider appt: Yes. Date due: in two weeks if not improved, appt made No Patient next injection due: N/A, appt made No  Lattie Haw, [place] screening examination/treatment/procedure(s) were performed by non-physician practitioner and as supervising physician I was immediately available for consultation/collaboration.  I agree with above assessment and plan.  Electronically Signed by: Howard Pouch, DO [place] [facility] [place]

## 2018-12-07 LAB — HM MAMMOGRAPHY

## 2018-12-08 LAB — HM PAP SMEAR: HM Pap smear: NORMAL

## 2019-02-24 IMAGING — US US THYROID
1 series · 13 of 25 positions shown · non-contrast
Comparison: None.

CLINICAL DATA: Palpable abnormality. Thyromegaly on physical
examination.

EXAM:
THYROID ULTRASOUND
TECHNIQUE: Ultrasound examination of the thyroid gland and adjacent soft
tissues was performed.

[Series 1: us thyroid · 0.06mm/px · 13 of 31 slices shown]
[im 1/31]
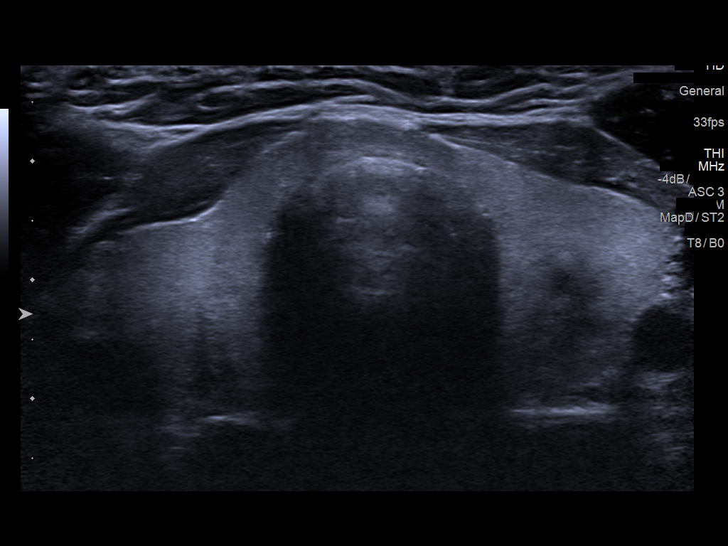
[im 3/31]
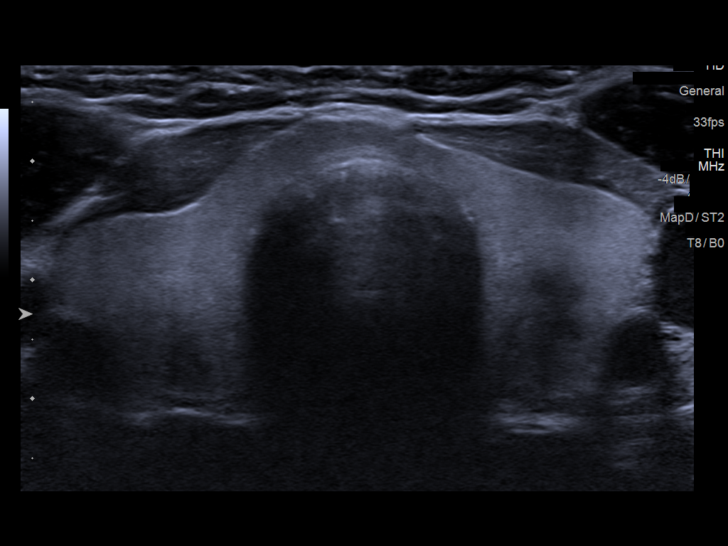
[im 6/31]
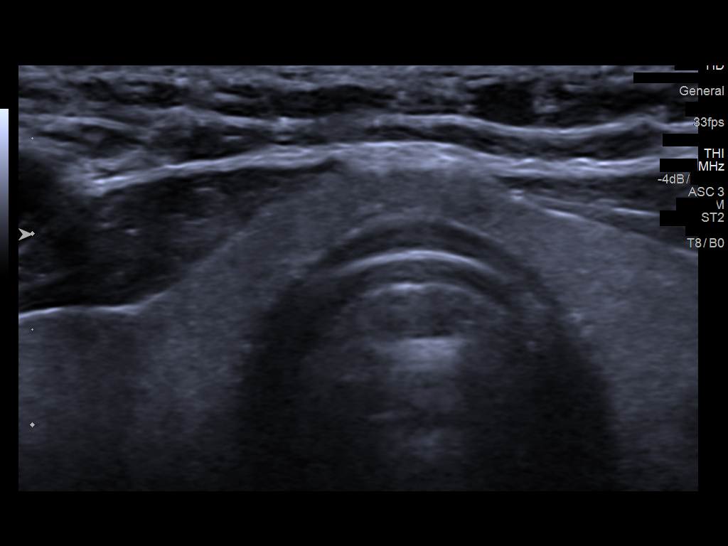
[im 8/31]
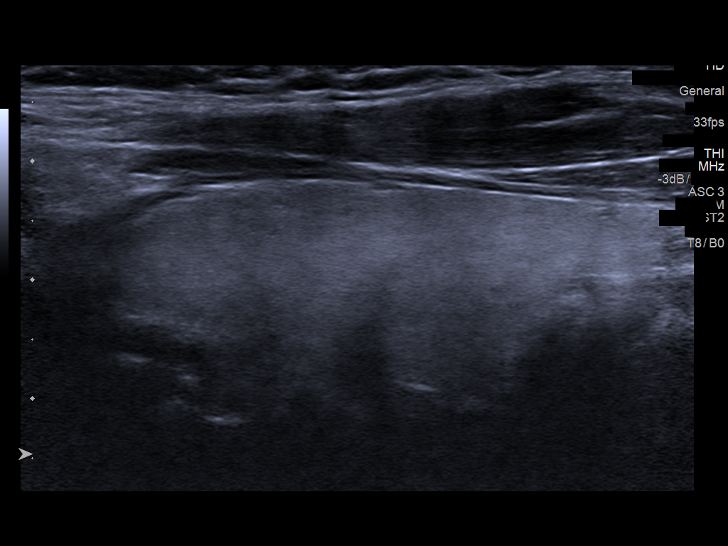
[im 11/31]
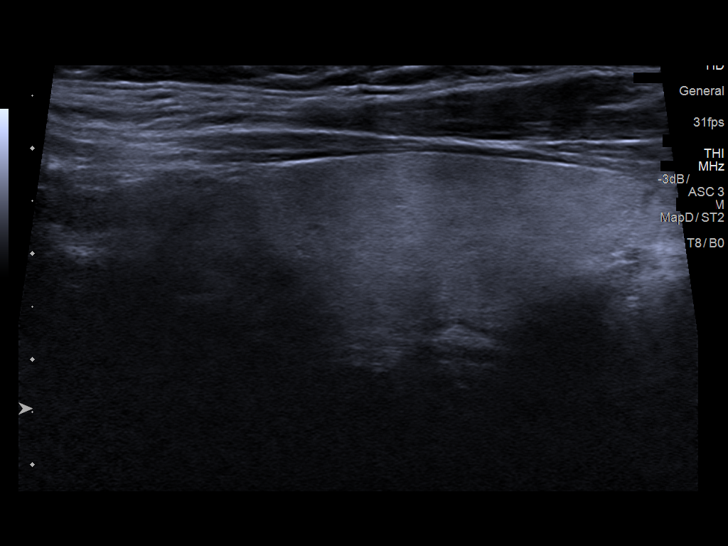
[im 13/31]
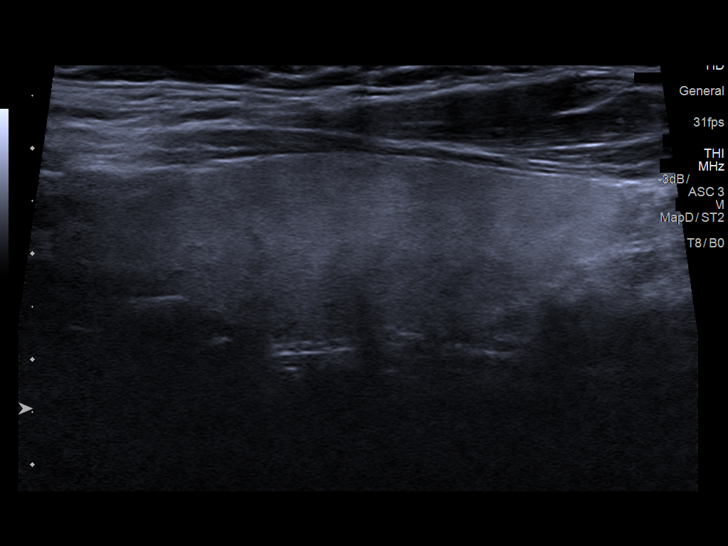
[im 16/31]
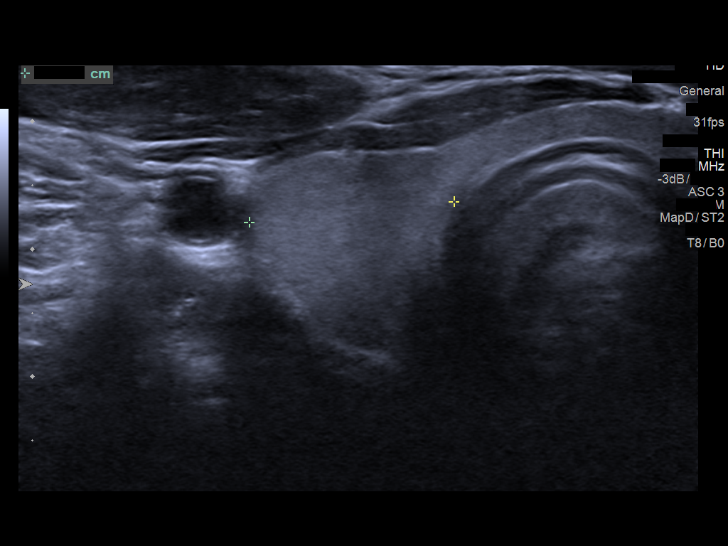
[im 18/31]
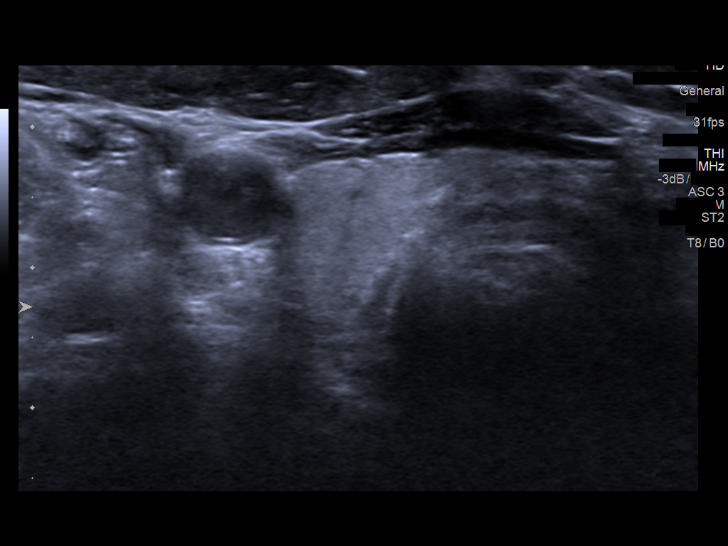
[im 21/31]
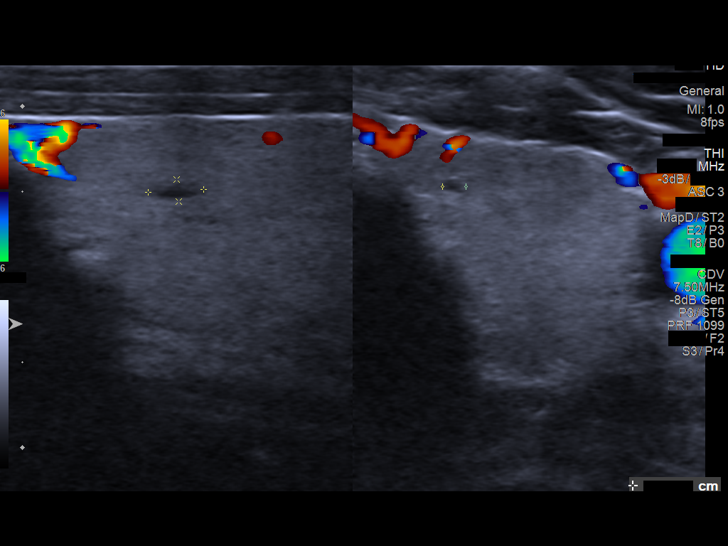
[im 23/31]
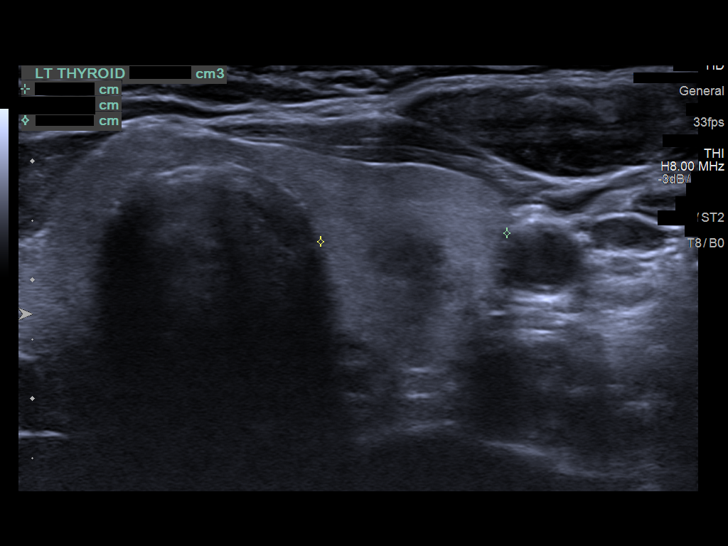
[im 26/31]
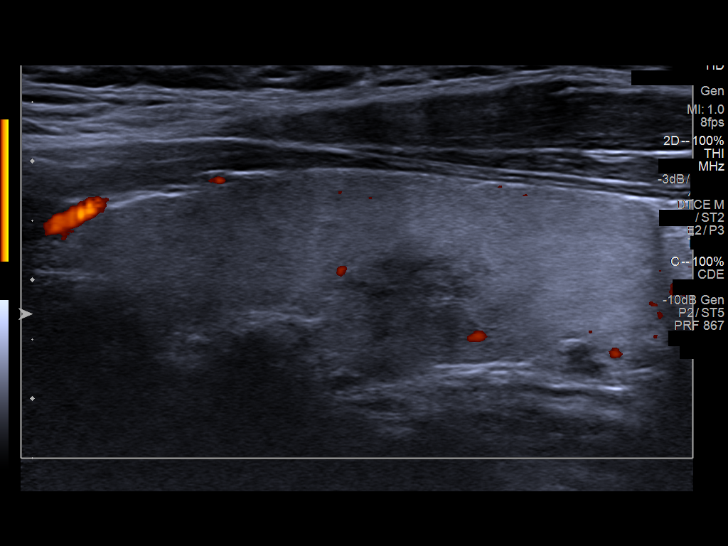
[im 28/31]
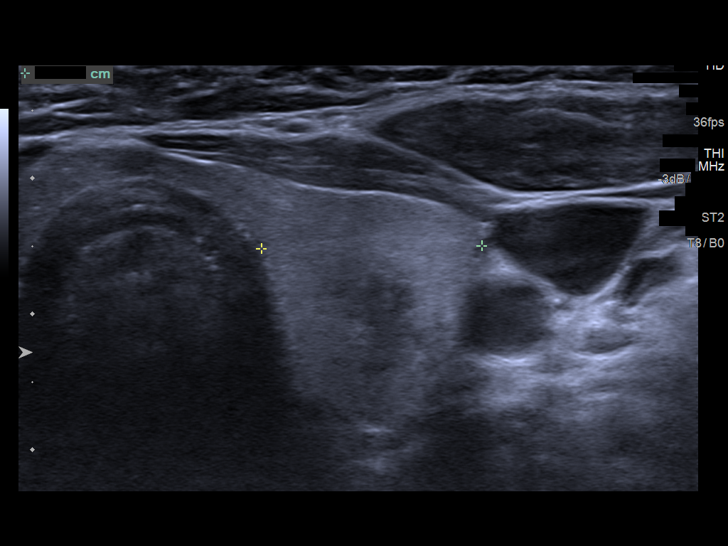
[im 31/31]
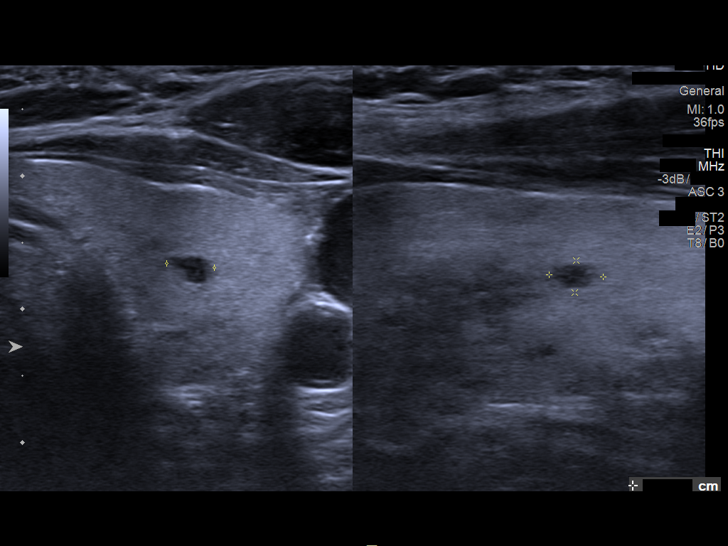

[13 of 25 positions shown; findings below may reference images not displayed]

FINDINGS: Parenchymal Echotexture: Normal

Isthmus: Normal in size measuring 0.3 cm in diameter

Right lobe: Normal in size measuring 4.8 x 1.8 x 1.6 cm

Left lobe: Normal in size measuring 5.1 x 1.6 x 1.6 cm

_________________________________________________________

Estimated total number of nodules >/= 1 cm: 1

Number of spongiform nodules >/=  2 cm not described below (TR1): 0

Number of mixed cystic and solid nodules >/= 1.5 cm not described
below (TR2): 0

_________________________________________________________

Nodule # 1:

Location: Left; Mid

Maximum size: 1.5 cm; Other 2 dimensions: 1.4 x 1.2 cm

Composition: solid/almost completely solid (2)

Echogenicity: hypoechoic (2)

Shape: taller-than-wide (3)

Margins: ill-defined (0)

Echogenic foci: none (0)

ACR TI-RADS total points: 7.

ACR TI-RADS risk category: TR5 (>/= 7 points).

ACR TI-RADS recommendations:

**Given size (>/= 1.0 cm) and appearance, fine needle aspiration of
this highly suspicious nodule should be considered based on TI-RADS
criteria.

_________________________________________________________

There are 3 additional punctate (sub 4 mm) anechoic cystic nodules
scattered within both the right and left lobes of the thyroid, none
of which meet meet imaging criteria to recommend percutaneous
sampling or dedicated follow-up.
IMPRESSION: Findings suggestive of multinodular goiter. Left-sided thyroid
nodule #1 meets imaging criteria to recommend percutaneous sampling
as clinically indicated.

The above is in keeping with the ACR TI-RADS recommendations - [HOSPITAL] 6898;[DATE].

## 2019-04-18 ENCOUNTER — Ambulatory Visit: Payer: BC Managed Care – PPO | Admitting: Sports Medicine

## 2019-04-18 ENCOUNTER — Other Ambulatory Visit: Payer: Self-pay

## 2019-04-18 ENCOUNTER — Ambulatory Visit (INDEPENDENT_AMBULATORY_CARE_PROVIDER_SITE_OTHER): Payer: BC Managed Care – PPO | Admitting: Sports Medicine

## 2019-04-18 DIAGNOSIS — G5601 Carpal tunnel syndrome, right upper limb: Secondary | ICD-10-CM

## 2019-04-18 NOTE — Assessment & Plan Note (Addendum)
This is a pleasant 49 year old female, she has had a couple of median nerve hydrodissections, the last of which was back in February 2019, and on the left side. She is now having paresthesias in her right hand in the median nerve distribution as well as triggering of her middle finger. As the median nerve runs in the carpal tunnel with 9 tendons, 2 of which are responsible for triggering of her middle finger I would expect improvement of her trigger finger as well after this injection. She has worn her splints, she is done stretching, nothing is working, today we proceeded with median nerve hydrodissection on the right. Return in 1 month to reevaluate symptoms.  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

## 2019-04-18 NOTE — Progress Notes (Addendum)
    Procedures performed today:    Procedure: Real-time Ultrasound Guided hydrodissection of the right median nerve at the carpal tunnel Device: Samsung [id number] Verbal informed consent obtained.  Time-out conducted.  Noted no overlying erythema, induration, or other signs of local infection.  Skin prepped in a sterile fashion.  Local anesthesia: Topical Ethyl chloride.  With sterile technique and under real time ultrasound guidance: Using a 25-gauge needle advanced into the carpal tunnel, taking care to avoid intraneural injection I injected medication both superficial to and deep to the median nerve freeing it from surrounding structures, I then redirected the needle deep and injected further medication around the flexor tendons deep within the carpal tunnel for a total of 1 cc kenalog 40, 5 cc 1% lidocaine without epinephrine. Completed without difficulty  Advised to call if fevers/chills, erythema, induration, drainage, or persistent bleeding.  Images permanently stored and available for review in the ultrasound unit.  Impression: Technically successful ultrasound guided median nerve hydrodissection.  Independent interpretation of notes and tests performed by another provider:   None.  Brief History, Exam, Impression, and Recommendations:    Carpal tunnel syndrome, right This is a pleasant 49 year old female, she has had a couple of median nerve hydrodissections, the last of which was back in February 2019, and on the left side. She is now having paresthesias in her right hand in the median nerve distribution as well as triggering of her middle finger. As the median nerve runs in the carpal tunnel with 9 tendons, 2 of which are responsible for triggering of her middle finger I would expect improvement of her trigger finger as well after this injection. She has worn her splints, she is done stretching, nothing is working, today we proceeded with median nerve hydrodissection on the  right. Return in 1 month to reevaluate symptoms.  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    ___________________________________________ Gwen Her. Dianah Field, M.D., ABFM., CAQSM. Primary Care and [place] Instructor of [place] of [facility] of Medicine

## 2019-05-23 ENCOUNTER — Encounter: Payer: Self-pay | Admitting: Sports Medicine

## 2019-05-23 ENCOUNTER — Ambulatory Visit: Payer: BC Managed Care – PPO | Admitting: Sports Medicine

## 2019-05-23 ENCOUNTER — Ambulatory Visit (INDEPENDENT_AMBULATORY_CARE_PROVIDER_SITE_OTHER): Payer: BC Managed Care – PPO | Admitting: Sports Medicine

## 2019-05-23 ENCOUNTER — Other Ambulatory Visit: Payer: Self-pay

## 2019-05-23 DIAGNOSIS — G5601 Carpal tunnel syndrome, right upper limb: Secondary | ICD-10-CM

## 2019-05-23 NOTE — Progress Notes (Signed)
    Procedures performed today:    None.  Independent interpretation of notes and tests performed by another provider:   None.  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 I saw [name] for carpal tunnel syndrome on the right, she also had some trigger finger, we did a hydrodissection at the last visit and she returns today completely pain-free, no triggering, no paresthesias, happy with results, return to see me as needed.    ___________________________________________ Ruth [name]. Dianah Field, M.D., ABFM., CAQSM. Primary Care and [place] Instructor of [place] of [facility] of Medicine

## 2019-05-23 NOTE — Assessment & Plan Note (Addendum)
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 I saw her for carpal tunnel syndrome on the right, she also had some trigger finger, we did a hydrodissection at the last visit and she returns today completely pain-free, no triggering, no paresthesias, happy with results, return to see me as needed.

## 2019-07-24 ENCOUNTER — Ambulatory Visit: Payer: BC Managed Care – PPO | Admitting: Family Medicine

## 2019-08-01 ENCOUNTER — Other Ambulatory Visit: Payer: Self-pay

## 2019-08-01 ENCOUNTER — Encounter: Payer: Self-pay | Admitting: Family Medicine

## 2019-08-01 ENCOUNTER — Ambulatory Visit (INDEPENDENT_AMBULATORY_CARE_PROVIDER_SITE_OTHER): Payer: BC Managed Care – PPO | Admitting: Family Medicine

## 2019-08-01 VITALS — BP 135/86 | HR 74 | Temp 97.8°F | Resp 16 | Ht 64.0 in | Wt 177.5 lb

## 2019-08-01 DIAGNOSIS — E559 Vitamin D deficiency, unspecified: Secondary | ICD-10-CM

## 2019-08-01 DIAGNOSIS — E663 Overweight: Secondary | ICD-10-CM | POA: Insufficient documentation

## 2019-08-01 DIAGNOSIS — E041 Nontoxic single thyroid nodule: Secondary | ICD-10-CM

## 2019-08-01 DIAGNOSIS — E785 Hyperlipidemia, unspecified: Secondary | ICD-10-CM | POA: Diagnosis not present

## 2019-08-01 DIAGNOSIS — Z130 Encounter for screening for diseases of the blood and blood-forming organs and certain disorders involving the immune mechanism: Secondary | ICD-10-CM

## 2019-08-01 DIAGNOSIS — Z1159 Encounter for screening for other viral diseases: Secondary | ICD-10-CM

## 2019-08-01 DIAGNOSIS — Z0000 Encounter for general adult medical examination without abnormal findings: Secondary | ICD-10-CM

## 2019-08-01 DIAGNOSIS — E669 Obesity, unspecified: Secondary | ICD-10-CM

## 2019-08-01 DIAGNOSIS — Z1211 Encounter for screening for malignant neoplasm of colon: Secondary | ICD-10-CM

## 2019-08-01 DIAGNOSIS — Z131 Encounter for screening for diabetes mellitus: Secondary | ICD-10-CM

## 2019-08-01 DIAGNOSIS — Z23 Encounter for immunization: Secondary | ICD-10-CM

## 2019-08-01 NOTE — Progress Notes (Signed)
This visit occurred during the SARS-CoV-2 public health emergency.  Safety protocols were in place, including screening questions prior to the visit, additional usage of staff PPE, and extensive cleaning of exam room while observing appropriate contact time as indicated for disinfecting solutions.    Patient ID: Ruth [name], female  DOB: 1970-11-07, 49 y.o.   MRN: [id number] Patient Care Team    Relationship Specialty Notifications Start End  Ruth Hillock, DO PCP - General Family Medicine  09/25/14   Ruth Other, MD Referring Physician Specialist  06/30/15     Chief Complaint  Patient presents with  . Annual Exam    Fasting. Would like Vit D checked, it has been low in the past. Last Pap smear was 2020.  Mammogram: Has not had in couple years. Colonoscopy: Never had one.     Subjective: Ruth [name] is a 49 y.o.  Female  present for CPE. All past medical history, surgical history, allergies, family history, immunizations, medications and social history were updated in the electronic medical record today. All recent labs, ED visits and hospitalizations within the last year were reviewed.  Health maintenance:  Colonoscopy: routine screening at 10 (past due), No Fhx> Gi referral placed today Mammogram: 12/2018 "normal"-->GYN Cervical cancer screening: UTD 2020. Records requested.  Immunizations: UTD tdap (2015), flu shot yearly encouraged, covid vaccines completed. Shingrix #1 provided today (nurse visit 3 mos for #2) Infectious disease screening: HIV completed, hep c desired today DEXA: routine screen.  Assistive device: none Oxygen [id number] Patient has a Dental home. Hospitalizations/ED visits: none   Depression screen [facility] 2/9 08/01/2019 10/10/2017 11/16/2016 11/12/2016 06/30/2015  Decreased Interest 0 0 0 0 0  Down, Depressed, Hopeless 0 0 0 0 0  PHQ - 2 Score 0 0 0 0 0   No flowsheet data found.   Immunization History  Administered Date(s) Administered    . DTaP 07/05/2010  . Influenza,inj,Quad PF,6+ Mos 12/25/2014, 01/05/2017, 01/17/2018, 10/17/2018  . PPD Test 05/18/2013  . Tdap 10/04/2013   Past Medical History:  Diagnosis Date  . Allergy   . Arthritis   . Asthma   . GERD (gastroesophageal reflux disease)   . History of abdominal pain    2007- mild decreased GB EF; 2008-collapsing R ovarian cyst; 2012 uterine fibroid x 1.  . HV (hallux valgus)    repaired as child  . Hyperlipidemia   . Prediabetes    Allergies  Allergen Reactions  . [name] Anaphylaxis    Seafood    . Amoxicillin Rash  . Shellfish-Derived Products Rash and Swelling   Past Surgical History:  Procedure Laterality Date  . BUNIONECTOMY Right    performed as child   Family History  Problem Relation Age of Onset  . Hypertension Mother   . Alcohol abuse Father   . Glaucoma Maternal Grandmother   . Asthma Maternal Grandfather   . 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 She lives alone.     Allergies as of 08/01/2019      Reactions   [name] Anaphylaxis   Seafood    Amoxicillin Rash   Shellfish-derived Products Rash, Swelling      Medication List       Accurate as of August 01, 2019  2:40 PM. If you have any questions, ask your nurse  or doctor.        fluticasone 50 MCG/ACT nasal spray Commonly known as: FLONASE Place 2 sprays into both nostrils daily.       All past medical history, surgical history, allergies, family history, immunizations andmedications were updated in the EMR today and reviewed under the history and medication portions of their EMR.     No results found for this or any previous visit (from the past 2160 hour(s)).  ROS: 14 pt review of systems performed and negative (unless mentioned in an HPI)  Objective: BP (!) 135/86 (BP Location: Right Arm, Patient Position: Sitting, Cuff Size: Normal)   Pulse 74    Temp 97.8 F (36.6 C) (Temporal)   Resp 16   Ht 5\' 4"  (1.626 m)   Wt 177 lb 8 oz (80.5 kg)   LMP 01/23/2018   SpO2 97%   BMI 30.47 kg/m  Gen: Afebrile. No acute distress. Nontoxic in appearance, well-developed, well-nourished,  Pleasant female.  HENT: AT. [place]. Bilateral TM visualized and normal in appearance, normal external auditory canal. MMM, no oral lesions, adequate dentition. Bilateral nares within normal limits. Throat without erythema, ulcerations or exudates. no Cough on exam, no hoarseness on exam. Eyes:Pupils Equal Round Reactive to light, Extraocular movements intact,  Conjunctiva without redness, discharge or icterus. Neck/lymp/endocrine: Supple,no lymphadenopathy, no thyromegaly CV: RRR no murmur, no edema, +2/4 P posterior tibialis pulses.  Chest: CTAB, no wheeze, rhonchi or crackles. normal Respiratory effort. good Air movement. Abd: Soft. flat. NTND. BS present. no Masses palpated. No hepatosplenomegaly. No rebound tenderness or guarding. Skin: no rashes, purpura or petechiae. Warm and well-perfused. Skin intact. Neuro/Msk:  Normal gait. PERLA. EOMi. Alert. Oriented x3.  Cranial nerves II through XII intact. Muscle strength 5/5 upper/lower extremity. DTRs equal bilaterally. Psych: Normal affect, dress and demeanor. Normal speech. Normal thought content and judgment.   No exam data present  Assessment/plan: Ruth [name] is a 49 y.o. female present for CPE Obesity (BMI 30.0-34.9) Routine exercise encouraged.  Thyroid nodule Stable. - TSH Hyperlipidemia LDL goal <130 Routine exercise and diet modifications.  - Comprehensive metabolic panel - Lipid panel - TSH Vitamin D deficiency - Vitamin D (25 hydroxy) Screening for deficiency anemia - CBC Diabetes mellitus screening - Hemoglobin A1c Need for hepatitis C screening test - Hepatitis C Antibody Colon cancer screening - Ambulatory referral to Gastroenterology Encounter for preventive health  examination Patient was encouraged to exercise greater than 150 minutes a week. Patient was encouraged to choose a diet filled with fresh fruits and vegetables, and lean meats. AVS provided to patient today for education/recommendation on gender specific health and safety maintenance. Colonoscopy: routine screening at 55 (past due), No Fhx> Gi referral placed today Mammogram: 12/2018 "normal"-->GYN Cervical cancer screening: UTD 2020. Records requested.  Immunizations: UTD tdap (2015), flu shot yearly encouraged, covid vaccines completed. Shingrix #1 provided today (nurse visit 3 mos for #2) Infectious disease screening: HIV completed, hep c desired today DEXA: routine screen.   Return in about 3 months (around 11/01/2019) for nurse visit- shingrix #2.  Orders Placed This Encounter  Procedures  . CBC  . Comprehensive metabolic panel  . Hemoglobin A1c  . Lipid panel  . TSH  . Vitamin D (25 hydroxy)  . Hepatitis C Antibody  . Ambulatory referral to Gastroenterology    No orders of the defined types were placed in this encounter.   Referral Orders     Ambulatory referral to Gastroenterology   Electronically signed by: Howard Pouch, DO  [place]

## 2019-08-01 NOTE — Patient Instructions (Addendum)
Keep a check on your BP if greater than 140/90 routinely would need to consider start of medication.  Avoid high sodium/salt content foods.  Continue routine exercise.      Health Maintenance, Female Adopting a healthy lifestyle and getting preventive care are important in promoting health and wellness. Ask your health care provider about:  The right schedule for you to have regular tests and exams.  Things you can do on your own to prevent diseases and keep yourself healthy. What should I know about diet, weight, and exercise? Eat a healthy diet   Eat a diet that includes plenty of vegetables, fruits, low-fat dairy products, and lean protein.  Do not eat a lot of foods that are high in solid fats, added sugars, or sodium. Maintain a healthy weight Body mass index (BMI) is used to identify weight problems. It estimates body fat based on height and weight. Your health care provider can help determine your BMI and help you achieve or maintain a healthy weight. Get regular exercise Get regular exercise. This is one of the most important things you can do for your health. Most adults should:  Exercise for at least 150 minutes each week. The exercise should increase your heart rate and make you sweat (moderate-intensity exercise).  Do strengthening exercises at least twice a week. This is in addition to the moderate-intensity exercise.  Spend less time sitting. Even light physical activity can be beneficial. Watch cholesterol and blood lipids Have your blood tested for lipids and cholesterol at 50 years of age, then have this test every 5 years. Have your cholesterol levels checked more often if:  Your lipid or cholesterol levels are high.  You are older than 50 years of age.  You are at high risk for heart disease. What should I know about cancer screening? Depending on your health history and family history, you may need to have cancer screening at various ages. This may include  screening for:  Breast cancer.  Cervical cancer.  Colorectal cancer.  Skin cancer.  Lung cancer. What should I know about heart disease, diabetes, and high blood pressure? Blood pressure and heart disease  High blood pressure causes heart disease and increases the risk of stroke. This is more likely to develop in people who have high blood pressure readings, are of African descent, or are overweight.  Have your blood pressure checked: ? Every 3-5 years if you are 105-24 years of age. ? Every year if you are 72 years old or older. Diabetes Have regular diabetes screenings. This checks your fasting blood sugar level. Have the screening done:  Once every three years after age 74 if you are at a normal weight and have a low risk for diabetes.  More often and at a younger age if you are overweight or have a high risk for diabetes. What should I know about preventing infection? Hepatitis B If you have a higher risk for hepatitis B, you should be screened for this virus. Talk with your health care provider to find out if you are at risk for hepatitis B infection. Hepatitis C Testing is recommended for:  Everyone born from [birth year] through 1966.  Anyone with known risk factors for hepatitis C. Sexually transmitted infections (STIs)  Get screened for STIs, including gonorrhea and chlamydia, if: ? You are sexually active and are younger than 50 years of age. ? You are older than 50 years of age and your health care provider tells you that you are at  risk for this type of infection. ? Your sexual activity has changed since you were last screened, and you are at increased risk for chlamydia or gonorrhea. Ask your health care provider if you are at risk.  Ask your health care provider about whether you are at high risk for HIV. Your health care provider may recommend a prescription medicine to help prevent HIV infection. If you choose to take medicine to prevent HIV, you should first get  tested for HIV. You should then be tested every 3 months for as long as you are taking the medicine. Pregnancy  If you are about to stop having your period (premenopausal) and you may become pregnant, seek counseling before you get pregnant.  Take 400 to 800 micrograms (mcg) of folic acid every day if you become pregnant.  Ask for birth control (contraception) if you want to prevent pregnancy. Osteoporosis and menopause Osteoporosis is a disease in which the bones lose minerals and strength with aging. This can result in bone fractures. If you are 55 years old or older, or if you are at risk for osteoporosis and fractures, ask your health care provider if you should:  Be screened for bone loss.  Take a calcium or vitamin D supplement to lower your risk of fractures.  Be given hormone replacement therapy (HRT) to treat symptoms of menopause. Follow these instructions at home: Lifestyle  Do not use any products that contain nicotine or tobacco, such as cigarettes, e-cigarettes, and chewing tobacco. If you need help quitting, ask your health care provider.  Do not use street drugs.  Do not share needles.  Ask your health care provider for help if you need support or information about quitting drugs. Alcohol use  Do not drink alcohol if: ? Your health care provider tells you not to drink. ? You are pregnant, may be pregnant, or are planning to become pregnant.  If you drink alcohol: ? Limit how much you use to 0-1 drink a day. ? Limit intake if you are breastfeeding.  Be aware of how much alcohol is in your drink. In the [place] one drink equals one 12 oz bottle of beer (355 mL), one 5 oz glass of wine (148 mL), or one 1 oz glass of hard liquor (44 mL). General instructions  Schedule regular health, dental, and eye exams.  Stay current with your vaccines.  Tell your health care provider if: ? You often feel depressed. ? You have ever been abused or do not feel safe at  home. Summary  Adopting a healthy lifestyle and getting preventive care are important in promoting health and wellness.  Follow your health care provider's instructions about healthy diet, exercising, and getting tested or screened for diseases.  Follow your health care provider's instructions on monitoring your cholesterol and blood pressure. This information is not intended to replace advice given to you by your health care provider. Make sure you discuss any questions you have with your health care provider. Document Revised: 12/14/2017 Document Reviewed: 12/14/2017 Elsevier Patient Education  2020 [employer].

## 2019-08-01 NOTE — Addendum Note (Signed)
Addended by: Caroll Rancher L on: 08/01/2019 02:53 PM   Modules accepted: Orders

## 2019-08-02 ENCOUNTER — Telehealth: Payer: Self-pay | Admitting: Family Medicine

## 2019-08-02 ENCOUNTER — Encounter: Payer: Self-pay | Admitting: Family Medicine

## 2019-08-02 LAB — CBC
HCT: 38.9 % (ref 35.0–45.0)
Hemoglobin: 12.4 g/dL (ref 11.7–15.5)
MCH: 25.9 pg — ABNORMAL LOW (ref 27.0–33.0)
MCHC: 31.9 g/dL — ABNORMAL LOW (ref 32.0–36.0)
MCV: 81.2 fL (ref 80.0–100.0)
MPV: 10.8 fL (ref 7.5–12.5)
Platelets: 306 10*3/uL (ref 140–400)
RBC: 4.79 10*6/uL (ref 3.80–5.10)
RDW: 13 % (ref 11.0–15.0)
WBC: 5 10*3/uL (ref 3.8–10.8)

## 2019-08-02 LAB — HEMOGLOBIN A1C
Hgb A1c MFr Bld: 5.5 % of total Hgb (ref <5.7)
Mean Plasma Glucose: 111 (calc)
eAG (mmol/L): 6.2 (calc)

## 2019-08-02 LAB — COMPREHENSIVE METABOLIC PANEL
AG Ratio: 1.8 (calc) (ref 1.0–2.5)
ALT: 12 U/L (ref 6–29)
AST: 17 U/L (ref 10–35)
Albumin: 4.2 g/dL (ref 3.6–5.1)
Alkaline phosphatase (APISO): 90 U/L (ref 37–153)
BUN: 12 mg/dL (ref 7–25)
CO2: 25 mmol/L (ref 20–32)
Calcium: 9.3 mg/dL (ref 8.6–10.4)
Chloride: 104 mmol/L (ref 98–110)
Creat: 1.02 mg/dL (ref 0.50–1.05)
Globulin: 2.4 g/dL (calc) (ref 1.9–3.7)
Glucose, Bld: 88 mg/dL (ref 65–99)
Potassium: 4.3 mmol/L (ref 3.5–5.3)
Sodium: 138 mmol/L (ref 135–146)
Total Bilirubin: 0.3 mg/dL (ref 0.2–1.2)
Total Protein: 6.6 g/dL (ref 6.1–8.1)

## 2019-08-02 LAB — LIPID PANEL
Cholesterol: 238 mg/dL — ABNORMAL HIGH (ref <200)
HDL: 49 mg/dL — ABNORMAL LOW (ref >=50)
LDL Cholesterol (Calc): 161 mg/dL (calc) — ABNORMAL HIGH
Non-HDL Cholesterol (Calc): 189 mg/dL (calc) — ABNORMAL HIGH (ref <130)
Total CHOL/HDL Ratio: 4.9 (calc) (ref <5.0)
Triglycerides: 152 mg/dL — ABNORMAL HIGH (ref <150)

## 2019-08-02 LAB — TSH: TSH: 1.09 mIU/L

## 2019-08-02 LAB — HEPATITIS C ANTIBODY
Hepatitis C Ab: NONREACTIVE
SIGNAL TO CUT-OFF: 0.02 (ref <1.00)

## 2019-08-02 LAB — VITAMIN D 25 HYDROXY (VIT D DEFICIENCY, FRACTURES): Vit D, 25-Hydroxy: 25 ng/mL — ABNORMAL LOW (ref 30–100)

## 2019-08-02 MED ORDER — ATORVASTATIN CALCIUM 10 MG PO TABS
10.0000 mg | ORAL_TABLET | Freq: Every day | ORAL | 3 refills | Status: DC
Start: 2019-08-02 — End: 2020-05-01

## 2019-08-02 NOTE — Telephone Encounter (Signed)
Please call patient Liver, kidney and thyroid function are normal Blood cell counts and electrolytes are normal Vitamin D levels are low at 25.  Vitamin D levels should be above 30, and ideally in the range of 40-50 for bone health. Diabetes screening/A1c is normal at 5.5 Cholesterol levels are high. total cholesterol of 238, HDL 49, LDL/bad cholesterol 161 and a nonfasting triglycerides 152.     -With her LDL/bad cholesterol at 161, this is an indication to start a medication for her cholesterol.  The medication helps bring down her cholesterol levels and also provides extra cardiovascular protection from heart attack and stroke.  Goal LDL levels for her currently are <130.  This target changes with age and if she develops any other conditions such as diabetes or hypertension in the future.  I would also recommend a mediterranean diet and for her to continue her routine exercise. A mediterranean diet is high in fruits, vegetables, whole grains, fish, chicken, nuts, healthy fats (olive oil or canola oil). Low fat dairy. There are many online resources and books on this diet. Limit butter, margarine, red meat and sweets.    I have called in the low-dose cholesterol medication called atorvastatin for her to start.  Follow-up in 3 months for recheck on cholesterol.

## 2019-08-02 NOTE — Telephone Encounter (Signed)
Vitamin D [phone] units daily would be recommended.

## 2019-08-02 NOTE — Telephone Encounter (Signed)
Patient advised and voiced understanding. She wanted to know a good vit D supplement to take to help increase current level. Please provide suggestions, if possible.

## 2019-08-02 NOTE — Telephone Encounter (Signed)
Patient advised and voiced understanding.  

## 2019-08-02 NOTE — Telephone Encounter (Signed)
Pt was called and VM was left to return call  °

## 2019-09-17 HISTORY — PX: US TRANSVAGINAL COMPLETE: HXRAD569

## 2019-10-15 ENCOUNTER — Encounter: Payer: Self-pay | Admitting: Family Medicine

## 2019-10-29 ENCOUNTER — Ambulatory Visit (INDEPENDENT_AMBULATORY_CARE_PROVIDER_SITE_OTHER): Payer: BC Managed Care – PPO

## 2019-10-29 ENCOUNTER — Encounter: Payer: Self-pay | Admitting: Family Medicine

## 2019-10-29 ENCOUNTER — Other Ambulatory Visit: Payer: Self-pay

## 2019-10-29 DIAGNOSIS — Z23 Encounter for immunization: Secondary | ICD-10-CM | POA: Diagnosis not present

## 2019-11-16 ENCOUNTER — Encounter: Payer: Self-pay | Admitting: Family Medicine

## 2019-11-16 ENCOUNTER — Telehealth (INDEPENDENT_AMBULATORY_CARE_PROVIDER_SITE_OTHER): Payer: BC Managed Care – PPO | Admitting: Family Medicine

## 2019-11-16 DIAGNOSIS — B9689 Other specified bacterial agents as the cause of diseases classified elsewhere: Secondary | ICD-10-CM | POA: Diagnosis not present

## 2019-11-16 DIAGNOSIS — J358 Other chronic diseases of tonsils and adenoids: Secondary | ICD-10-CM | POA: Diagnosis not present

## 2019-11-16 DIAGNOSIS — J329 Chronic sinusitis, unspecified: Secondary | ICD-10-CM | POA: Diagnosis not present

## 2019-11-16 MED ORDER — PREDNISONE 20 MG PO TABS: 40.0000 mg | ORAL_TABLET | Freq: Every day | ORAL | 0 refills | Status: DC

## 2019-11-16 MED ORDER — DOXYCYCLINE HYCLATE 100 MG PO TABS: 100.0000 mg | ORAL_TABLET | Freq: Two times a day (BID) | ORAL | 0 refills | Status: DC

## 2019-11-16 MED ORDER — FLUTICASONE PROPIONATE 50 MCG/ACT NA SUSP
2.0000 | Freq: Every day | NASAL | 6 refills | Status: DC
Start: 2019-11-16 — End: 2020-06-24

## 2019-11-16 NOTE — Progress Notes (Signed)
VIRTUAL VISIT VIA VIDEO  I connected with Ruth [name] on 11/16/19 at 11:00 AM EST by elemedicine application and verified that I am speaking with the correct person using two identifiers. Location patient: Home Location provider: [facility], Office Persons participating in the virtual visit: Patient, Dr. Raoul Pitch and Samul Dada, CMA  I discussed the limitations of evaluation and management by telemedicine and the availability of in person appointments. The patient expressed understanding and agreed to proceed.   SUBJECTIVE Chief Complaint  Patient presents with  . Sore Throat    Pt c/o sore throat, ear pain, nasal congestion on the right side and hoarseness; Pt seen 2 two stones in throat; Pt has PMH of tonsil stones;     HPI: Ruth [name] is a 49 y.o. female present for sore throat, ear pain, sinus congestion and pressure, nasal congestion. She reports lymphadenopathy and soreness right ant cervical region of neck. All sx are right sided. She has had recurrent tonsil stones in the past. She reports noting 2 tonsil stones at the onset of her symptoms 2.5-3 weeks ago, but not anything recent. She has been using flonase and advil.   ROS: See pertinent positives and negatives per HPI.  Patient Active Problem List   Diagnosis Date Noted  . Obesity (BMI 30.0-34.9) 08/01/2019  . Eustachian tube dysfunction, bilateral 11/12/2016  . Lateral epicondylitis of left elbow 11/12/2016  . Right wrist pain 11/12/2016  . Thyromegaly 05/12/2016  . Perimenopause 05/12/2016  . Thyroid nodule 05/12/2016  . Carpal tunnel syndrome, right 04/06/2016  . Hyperlipidemia LDL goal <130 06/30/2015  . GERD (gastroesophageal reflux disease) 06/16/2015  . Hot flashes 06/11/2014  . Vitamin D deficiency 05/10/2014    Social History   Tobacco Use  . Smoking status: Never Smoker  . Smokeless tobacco: Never Used  Substance Use Topics  . Alcohol use: No    Comment: occasional    Current  Outpatient Medications:  .  atorvastatin (LIPITOR) 10 MG tablet, Take 1 tablet (10 mg total) by mouth daily., Disp: 90 tablet, Rfl: 3 .  fluticasone (FLONASE) 50 MCG/ACT nasal spray, Place 2 sprays into both nostrils daily., Disp: 16 g, Rfl: 6 .  doxycycline (VIBRA-TABS) 100 MG tablet, Take 1 tablet (100 mg total) by mouth 2 (two) times daily., Disp: 20 tablet, Rfl: 0 .  predniSONE (DELTASONE) 20 MG tablet, Take 2 tablets (40 mg total) by mouth daily with breakfast., Disp: 6 tablet, Rfl: 0  Allergies  Allergen Reactions  . Other Anaphylaxis    Seafood    . Amoxicillin Rash  . Shellfish-Derived Products Rash and Swelling    OBJECTIVE: LMP 01/23/2018  Gen: No acute distress. Nontoxic in appearance.  HENT: AT. [place].  MMM.  Eyes:Pupils Equal Round Reactive to light, Extraocular movements intact,  Conjunctiva without redness, discharge or icterus. Chest: Cough and shortness of breath not present.  Skin: no rashes, purpura or petechiae.  Neuro:   Alert. Oriented x3  Psych: Normal affect and demeanor. Normal speech. Normal thought content and judgment.  ASSESSMENT AND PLAN: Ruth [name] is a 49 y.o. female present for  Tonsil stone/Bacterial sinusitis Sx now present > 3 weeks.  Rest, hydrate.  flonase, mucinex (DM if cough), nettie pot or nasal saline.  3 d prednisone and doxy course prescribed, take until completed.  Referral to ENT for recurrent tonsil stones. Pt would like to be considered for tonsillectomy.  F/U 2 weeks of not improved.  - Ambulatory referral to ENT  Howard Pouch, DO 11/16/2019   No follow-ups on file.  Orders Placed This Encounter  Procedures  . Ambulatory referral to ENT   Meds ordered this encounter  Medications  . predniSONE (DELTASONE) 20 MG tablet    Sig: Take 2 tablets (40 mg total) by mouth daily with breakfast.    Dispense:  6 tablet    Refill:  0  . doxycycline (VIBRA-TABS) 100 MG tablet    Sig: Take 1 tablet (100 mg total) by mouth  2 (two) times daily.    Dispense:  20 tablet    Refill:  0  . fluticasone (FLONASE) 50 MCG/ACT nasal spray    Sig: Place 2 sprays into both nostrils daily.    Dispense:  16 g    Refill:  6    Referral Orders     Ambulatory referral to ENT

## 2019-12-20 ENCOUNTER — Other Ambulatory Visit: Payer: Self-pay

## 2019-12-20 ENCOUNTER — Ambulatory Visit (INDEPENDENT_AMBULATORY_CARE_PROVIDER_SITE_OTHER): Payer: BC Managed Care – PPO | Admitting: Sports Medicine

## 2019-12-20 ENCOUNTER — Ambulatory Visit (INDEPENDENT_AMBULATORY_CARE_PROVIDER_SITE_OTHER): Payer: BC Managed Care – PPO

## 2019-12-20 DIAGNOSIS — G5601 Carpal tunnel syndrome, right upper limb: Secondary | ICD-10-CM

## 2019-12-20 NOTE — Assessment & Plan Note (Signed)
Right-sided median nerve hydrodissection. Return to see me as needed.

## 2019-12-20 NOTE — Progress Notes (Signed)
    Procedures performed today:    Procedure: Real-time Ultrasound Guided hydrodissection of the right median nerve at the carpal tunnel Device: Samsung HS60 Verbal informed consent obtained.  Time-out conducted.  Noted no overlying erythema, induration, or other signs of local infection.  Skin prepped in a sterile fashion.  Local anesthesia: Topical Ethyl chloride.  With sterile technique and under real time ultrasound guidance: Using a 25-gauge needle advanced into the carpal tunnel, taking care to avoid intraneural injection I injected medication both superficial to and deep to the median nerve freeing it from surrounding structures, I then redirected the needle deep and injected further medication around the flexor tendons deep within the carpal tunnel for a total of 1 cc kenalog 40, 5 cc 1% lidocaine without epinephrine. Completed without difficulty  Advised to call if fevers/chills, erythema, induration, drainage, or persistent bleeding.  Images permanently stored and available for review in PACS.  Impression: Technically successful ultrasound guided median nerve hydrodissection.  Independent interpretation of notes and tests performed by another provider:   None.  Brief History, Exam, Impression, and Recommendations:    Carpal tunnel syndrome, right Right-sided median nerve hydrodissection. Return to see me as needed.    ___________________________________________ Gwen Her. Dianah Field, M.D., ABFM., CAQSM. Primary Care and [place] Instructor of [place] [facility]

## 2020-01-18 ENCOUNTER — Other Ambulatory Visit: Payer: BC Managed Care – PPO

## 2020-01-18 DIAGNOSIS — Z20822 Contact with and (suspected) exposure to covid-19: Secondary | ICD-10-CM

## 2020-01-20 LAB — NOVEL CORONAVIRUS, NAA: SARS-CoV-2, NAA: DETECTED — AB

## 2020-01-20 LAB — SARS-COV-2, NAA 2 DAY TAT

## 2020-01-21 ENCOUNTER — Telehealth: Payer: Self-pay

## 2020-01-21 NOTE — Telephone Encounter (Signed)
Called to discuss with patient about COVID-19 symptoms and the use of one of the available treatments for those with mild to moderate Covid symptoms and at a high risk of hospitalization.  Pt appears to qualify for outpatient treatment due to co-morbid conditions and/or a member of an at-risk group in accordance with the FDA Emergency Use Authorization.    Symptom onset: Unknown Vaccinated: Yes Booster? Yes Immunocompromised? No Qualifiers: None  Unable to reach pt - Phone rang once and disconnects.   Ruth [name]

## 2020-03-28 ENCOUNTER — Encounter: Payer: Self-pay | Admitting: Family Medicine

## 2020-03-28 ENCOUNTER — Other Ambulatory Visit: Payer: Self-pay

## 2020-03-28 ENCOUNTER — Ambulatory Visit: Payer: BC Managed Care – PPO | Admitting: Family Medicine

## 2020-03-28 VITALS — BP 127/80 | HR 77 | Temp 98.3°F | Ht 64.0 in | Wt 181.0 lb

## 2020-03-28 DIAGNOSIS — A084 Viral intestinal infection, unspecified: Secondary | ICD-10-CM

## 2020-03-28 MED ORDER — SENNOSIDES-DOCUSATE SODIUM 8.6-50 MG PO TABS: 2.0000 | ORAL_TABLET | Freq: Every day | ORAL | 0 refills | Status: DC

## 2020-03-28 NOTE — Progress Notes (Signed)
This visit occurred during the SARS-CoV-2 public health emergency.  Safety protocols were in place, including screening questions prior to the visit, additional usage of staff PPE, and extensive cleaning of exam room while observing appropriate contact time as indicated for disinfecting solutions.    Arnette Felts , 1970-10-07, 50 y.o., female MRN: [id number] Patient Care Team    Relationship Specialty Notifications Start End  [name], DO PCP - General Family Medicine  09/25/14   [name], MD Referring Physician Specialist  06/30/15     Chief Complaint  Patient presents with  . Abdominal Pain    Pt c/o LLQ pain, body aches, pt reports some changes in stool x 4 days;      Subjective: Pt presents for an OV with complaints of LLQ abd discomfort, nausea and gassiness that started about 3-4 days ago. She endorses feeling fatigued and myalgias.  She denies fever, chills, vomit or diarrhea. She is overdue for colon cancer screen. She took ibf which helped with cramping.   Depression screen [facility] 2/9 03/28/2020 08/01/2019 10/10/2017 11/16/2016 11/12/2016  Decreased Interest 0 0 0 0 0  Down, Depressed, Hopeless 0 0 0 0 0  PHQ - 2 Score 0 0 0 0 0    Allergies  Allergen Reactions  . Other Anaphylaxis    Seafood    . Amoxicillin Rash  . 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 She lives alone.    Past Medical History:  Diagnosis Date  . Allergy   . Arthritis   . Asthma   . GERD (gastroesophageal reflux disease)   . History of abdominal pain    2007- mild decreased GB EF; 2008-collapsing R ovarian cyst; 2012 uterine fibroid x 1.  . HV (hallux valgus)    repaired as child  . Hyperlipidemia   . Prediabetes    Past Surgical History:  Procedure Laterality Date  . BUNIONECTOMY Right    performed as child   Family History  Problem Relation  Age of Onset  . Hypertension Mother   . Alcohol abuse Father   . Glaucoma Maternal Grandmother   . Asthma Maternal Grandfather   . Cancer Paternal Grandfather        lung   Allergies as of 03/28/2020      Reactions   Other Anaphylaxis   Seafood    Amoxicillin Rash   Shellfish-derived Products Rash, Swelling      Medication List       Accurate as of March 28, 2020  2:08 PM. If you have any questions, ask your nurse or doctor.        STOP taking these medications   doxycycline 100 MG tablet Commonly known as: VIBRA-TABS Stopped by: Howard Pouch, DO   predniSONE 20 MG tablet Commonly known as: DELTASONE Stopped by: Howard Pouch, DO     TAKE these medications   atorvastatin 10 MG tablet Commonly known as: LIPITOR Take 1 tablet (10 mg total) by mouth daily.   clindamycin 300 MG capsule Commonly known as: CLEOCIN Take 300 mg by mouth 3 (three) times daily.   fluticasone 50 MCG/ACT nasal spray Commonly known as: FLONASE Place 2 sprays into both nostrils daily.       All past medical history, surgical history, allergies, family history, immunizations andmedications were updated in the EMR today  and reviewed under the history and medication portions of their EMR.     ROS: Negative, with the exception of above mentioned in HPI   Objective:  BP 127/80   Pulse 77   Temp 98.3 F (36.8 C) (Oral)   Ht 5\' 4"  (1.626 m)   Wt 181 lb (82.1 kg)   LMP 01/23/2018   SpO2 98%   BMI 31.07 kg/m  Body mass index is 31.07 kg/m. Gen: Afebrile. No acute distress. Nontoxic in appearance, well developed, well nourished.  HENT: AT. [place].  Eyes:Pupils Equal Round Reactive to light, Extraocular movements intact,  Conjunctiva without redness, discharge or icterus. CV: RRR Chest: CTAB Abd: Soft. TTP left lower abd. Moderate stool burden. Mild distention. BS present. no Masses palpated. No rebound or guarding.  Skin: no rashes, purpura or petechiae.  Neuro:  Normal gait. PERLA. EOMi.  Alert. Oriented x3  Psych: Normal affect, dress and demeanor. Normal speech. Normal thought content and judgment.  No exam data present No results found. No results found for this or any previous visit (from the past 24 hour(s)).  Assessment/Plan: GRIFFIN DEWILDE is a 50 y.o. female present for OV for  Viral gastroenteritis DDX: diverticulitis for viral GI Hydrate Gas x, miralax BID.  senakot 2 tabs qhs Pt encouraged if sx worsen over the weekend, fever, or BPR to be seen urgently.    Reviewed expectations re: course of current medical issues.  Discussed self-management of symptoms.  Outlined signs and symptoms indicating need for more acute intervention.  Patient verbalized understanding and all questions were answered.  Patient received an After-Visit Summary.    No orders of the defined types were placed in this encounter.  No orders of the defined types were placed in this encounter.  Referral Orders  No referral(s) requested today     Note is dictated utilizing voice recognition software. Although note has been proof read prior to signing, occasional typographical errors still can be missed. If any questions arise, please do not hesitate to call for verification.   electronically signed by:  Howard Pouch, DO  [place]

## 2020-03-28 NOTE — Patient Instructions (Addendum)
Hydrate- at least 80 ounces a day of water Start gas-X twice a day (over the counter) Start 1 cap of miralax every 12 hours until BM soft> then stop.  Start senakot 2 tabs before bed (called in for you)    Viral Gastroenteritis, Adult  Viral gastroenteritis is also known as the stomach flu. This condition may affect your stomach, your small intestine, and your large intestine. It can cause sudden watery poop (diarrhea), fever, and throwing up (vomiting). This condition is caused by certain germs (viruses). These germs can be passed from person to person very easily (are contagious). Having watery poop and throwing up can make you feel weak and cause you to not have enough water in your body (get dehydrated). This can make you tired and thirsty, make you have a dry mouth, and make it so you pee (urinate) less often. It is important to replace the fluids that you lose from having watery poop and throwing up. What are the causes?  You can get sick by catching viruses from other people.  You can also get sick by: ? Eating food, drinking water, or touching a surface that has the viruses on it (is contaminated). ? Sharing utensils or other personal items with a person who is sick. What increases the risk?  Having a weak body defense system (immune system).  Living with one or more children who are younger than 95 years old.  Living in a nursing home.  Going on cruise ships. What are the signs or symptoms? Symptoms of this condition start suddenly. Symptoms may last for a few days or for as long as a week.  Common symptoms include: ? Watery poop. ? Throwing up.  Other symptoms include: ? Fever. ? Headache. ? Feeling tired (fatigue). ? Pain in the belly (abdomen). ? Chills. ? Feeling weak. ? Feeling sick to your stomach (nauseous). ? Muscle aches. ? Not feeling hungry. How is this treated?  This condition typically goes away on its own.  The focus of treatment is to replace the  fluids that you lose. This condition may be treated with: ? An ORS (oral rehydration solution). This is a drink that is sold at pharmacies and stores. ? Medicines to help with your symptoms. ? Probiotic supplements to reduce symptoms of diarrhea. ? Fluids given through an IV tube, if needed.  Older adults and people with other diseases or a weak body defense system are at higher risk for not having enough water in the body. Follow these instructions at home: Eating and drinking  Take an ORS as told by your doctor.  Drink clear fluids in small amounts as you are able. Clear fluids include: ? Water. ? Ice chips. ? Fruit juice with water added to it (diluted). ? Low-calorie sports drinks.  Drink enough fluid to keep your pee (urine) pale yellow.  Eat small amounts of healthy foods every 3-4 hours as you are able. This may include whole grains, fruits, vegetables, lean meats, and yogurt.  Avoid fluids that have a lot of sugar or caffeine in them, such as energy drinks, sports drinks, and soda.  Avoid spicy or fatty foods.  Avoid alcohol.   General instructions  Wash your hands often. This is very important after you have watery poop or you throw up. If you cannot use soap and water, use hand sanitizer.  Make sure that all people in your home wash their hands well and often.  Take over-the-counter and prescription medicines only as told  by your doctor.  Rest at home while you get better.  Watch your condition for any changes.  Take a warm bath to help with any burning or pain from having watery poop.  Keep all follow-up visits as told by your doctor. This is important.   Contact a doctor if:  You cannot keep fluids down.  Your symptoms get worse.  You have new symptoms.  You feel light-headed.  You feel dizzy.  You have muscle cramps. Get help right away if:  You have chest pain.  You feel very weak.  You pass out (faint).  You see blood in your  throw-up.  Your throw-up looks like coffee grounds.  You have bloody or black poop (stools) or poop that looks like tar.  You have a very bad headache, or a stiff neck, or both.  You have a rash.  You have very bad pain, cramping, or bloating in your belly.  You have trouble breathing.  You are breathing very quickly.  You have a fast heartbeat.  Your skin feels cold and clammy.  You feel mixed up (confused).  You have pain when you pee.  You have signs of not having enough water in the body, such as: ? Dark pee, hardly any pee, or no pee. ? Cracked lips. ? Dry mouth. ? Sunken eyes. ? Feeling very sleepy. ? Feeling weak. Summary  Viral gastroenteritis is also known as the stomach flu.  This condition can cause sudden watery poop (diarrhea), fever, and throwing up (vomiting).  These germs can be passed from person to person very easily.  Take an ORS as told by your doctor. This is a drink that is sold at pharmacies and stores.  Drink fluids in small amounts many times each day as you are able. This information is not intended to replace advice given to you by your health care provider. Make sure you discuss any questions you have with your health care provider. Document Revised: 10/26/2017 Document Reviewed: 10/26/2017 Elsevier Patient Education  2021 [employer].

## 2020-04-26 ENCOUNTER — Other Ambulatory Visit: Payer: Self-pay

## 2020-04-26 ENCOUNTER — Encounter (HOSPITAL_BASED_OUTPATIENT_CLINIC_OR_DEPARTMENT_OTHER): Payer: Self-pay | Admitting: Emergency Medicine

## 2020-04-26 ENCOUNTER — Emergency Department (HOSPITAL_BASED_OUTPATIENT_CLINIC_OR_DEPARTMENT_OTHER)
Admission: EM | Admit: 2020-04-26 | Discharge: 2020-04-27 | Disposition: A | Discharge status: Home / Self Care | Payer: BC Managed Care – PPO | Attending: Emergency Medicine | Admitting: Emergency Medicine

## 2020-04-26 DIAGNOSIS — T7840XA Allergy, unspecified, initial encounter: Secondary | ICD-10-CM | POA: Diagnosis not present

## 2020-04-26 DIAGNOSIS — J45909 Unspecified asthma, uncomplicated: Secondary | ICD-10-CM | POA: Insufficient documentation

## 2020-04-26 DIAGNOSIS — Z91013 Allergy to seafood: Secondary | ICD-10-CM | POA: Insufficient documentation

## 2020-04-26 MED ORDER — DIPHENHYDRAMINE HCL 50 MG/ML IJ SOLN
25.0000 mg | Freq: Once | INTRAMUSCULAR | Status: AC
  Administered 2020-04-26: 25 mg via INTRAVENOUS
  Filled 2020-04-26: qty 1

## 2020-04-26 MED ORDER — FAMOTIDINE IN NACL 20-0.9 MG/50ML-% IV SOLN
20.0000 mg | Freq: Once | INTRAVENOUS | Status: AC
  Administered 2020-04-26: 20 mg via INTRAVENOUS
  Filled 2020-04-26: qty 50

## 2020-04-26 MED ORDER — FAMOTIDINE 20 MG PO TABS: 20.0000 mg | ORAL_TABLET | Freq: Every day | ORAL | 0 refills | Status: DC

## 2020-04-26 MED ORDER — METHYLPREDNISOLONE SODIUM SUCC 125 MG IJ SOLR
125.0000 mg | Freq: Once | INTRAMUSCULAR | Status: AC
  Administered 2020-04-26: 125 mg via INTRAVENOUS
  Filled 2020-04-26: qty 2

## 2020-04-26 MED ORDER — SODIUM CHLORIDE 0.9 % IV SOLN: INTRAVENOUS | Status: DC | PRN

## 2020-04-26 MED ORDER — PREDNISONE 20 MG PO TABS: 40.0000 mg | ORAL_TABLET | Freq: Every day | ORAL | 0 refills | Status: AC

## 2020-04-26 MED ORDER — DIPHENHYDRAMINE HCL 25 MG PO TABS: 25.0000 mg | ORAL_TABLET | Freq: Four times a day (QID) | ORAL | 0 refills | Status: DC | PRN

## 2020-04-26 MED ORDER — EPINEPHRINE 0.3 MG/0.3ML IJ SOAJ: 0.3000 mg | INTRAMUSCULAR | 0 refills | Status: AC | PRN

## 2020-04-26 NOTE — ED Triage Notes (Signed)
Reports having swelling to both eyes.  Worse on the right then the left.  Hx of seafood allergy.  Reports she ate out but did not get seafood today.  Took 1 benadryl pta.  No swelling reported to tongue or lips.

## 2020-04-26 NOTE — Discharge Instructions (Addendum)
As discussed, I suspect you had an allergic reaction to something in your dinner. I am sending you home with steroids, pepcid, and benadryl. Take as prescribed for the next 5 days. I have also prescribed you an Epi-pen. Use as needed for allergic reactions with breathing difficulties. If you use your epi-pen, you must report to the ER for further evaluation. Return to the ER for new or worsening symptoms.

## 2020-04-26 NOTE — ED Provider Notes (Signed)
[place] EMERGENCY DEPARTMENT Provider Note   CSN: [id number] Arrival date & time: 04/26/20  2024     History Chief Complaint  Patient presents with  . Allergic Reaction    Ruth [name] is a 50 y.o. female with a past medical history significant for hyperlipidemia, GERD, asthma, and seasonal allergies who presents to the ED due to bilateral eye swelling.  Patient states swelling started in her right eye and progressively moved to her left eye roughly 2 hours prior to arrival.  Patient notes she has a seafood allergy and has had similar reactions in the remote past. No history of anaphylaxis.  Patient notes she ate [employer] roughly 2 hours prior to onset of symptoms.  No known seafood ingestion.  Denies difficulties breathing, throat closing sensation, rash, abdominal pain, nausea, or vomiting.  She used eyedrops in her right eye with no relief.  She also took a Benadryl prior to arrival with no change in edema.  Denies new lotion, medications, make-up, or facial products.   History obtained from patient and past medical records. No interpreter used during encounter.      Past Medical History:  Diagnosis Date  . Allergy   . Arthritis   . Asthma   . GERD (gastroesophageal reflux disease)   . History of abdominal pain    2007- mild decreased GB EF; 2008-collapsing R ovarian cyst; 2012 uterine fibroid x 1.  . HV (hallux valgus)    repaired as child  . Hyperlipidemia   . Prediabetes     Patient Active Problem List   Diagnosis Date Noted  . Obesity (BMI 30.0-34.9) 08/01/2019  . Eustachian tube dysfunction, bilateral 11/12/2016  . Lateral epicondylitis of left elbow 11/12/2016  . Right wrist pain 11/12/2016  . Thyromegaly 05/12/2016  . Perimenopause 05/12/2016  . Thyroid nodule 05/12/2016  . Carpal tunnel syndrome, right 04/06/2016  . Hyperlipidemia LDL goal <130 06/30/2015  . GERD (gastroesophageal reflux disease) 06/16/2015  . Hot flashes 06/11/2014   . Vitamin D deficiency 05/10/2014    Past Surgical History:  Procedure Laterality Date  . BUNIONECTOMY Right    performed as child     OB History    Gravida  0   Para  0   Term  0   Preterm  0   AB  0   Living        SAB  0   IAB  0   Ectopic  0   Multiple      Live Births              Family History  Problem Relation Age of Onset  . Hypertension Mother   . Alcohol abuse Father   . Glaucoma Maternal Grandmother   . Asthma Maternal Grandfather   . Cancer Paternal Grandfather        lung    Social History   Tobacco Use  . Smoking status: Never Smoker  . Smokeless tobacco: Never Used  Vaping Use  . Vaping Use: Never used  Substance Use Topics  . Alcohol use: No    Comment: occasional  . Drug use: No    Home Medications Prior to Admission medications   Medication Sig Start Date End Date Taking? Authorizing Provider  diphenhydrAMINE (BENADRYL) 25 MG tablet Take 1 tablet (25 mg total) by mouth every 6 (six) hours as needed. 04/26/20  Yes Maritssa Haughton C, PA-C  EPINEPHrine 0.3 mg/0.3 mL IJ SOAJ injection Inject 0.3 mg into the  muscle as needed for anaphylaxis. 04/26/20  Yes Schyler Butikofer, Druscilla Brownie, PA-C  famotidine (PEPCID) 20 MG tablet Take 1 tablet (20 mg total) by mouth daily for 5 days. 04/26/20 05/01/20 Yes Uzma Hellmer, Druscilla Brownie, PA-C  predniSONE (DELTASONE) 20 MG tablet Take 2 tablets (40 mg total) by mouth daily for 5 days. 04/26/20 05/01/20 Yes Domonique Brouillard, Druscilla Brownie, PA-C  atorvastatin (LIPITOR) 10 MG tablet Take 1 tablet (10 mg total) by mouth daily. 08/02/19   Kuneff, Renee A, DO  clindamycin (CLEOCIN) 300 MG capsule Take 300 mg by mouth 3 (three) times daily. 03/26/20   [provider]  fluticasone (FLONASE) 50 MCG/ACT nasal spray Place 2 sprays into both nostrils daily. 11/16/19   Kuneff, Renee A, DO  senna-docusate (SENOKOT S) 8.6-50 MG tablet Take 2 tablets by mouth at bedtime. 03/28/20   Kuneff, Renee A, DO    Allergies    Other,  Amoxicillin, and Shellfish-derived products  Review of Systems   Review of Systems  HENT: Positive for facial swelling.   Respiratory: Negative for shortness of breath.   Gastrointestinal: Negative for abdominal pain, nausea and vomiting.  Skin: Negative for rash.  All other systems reviewed and are negative.   Physical Exam Updated Vital Signs BP (!) 135/92   Pulse 65   Temp 98.2 F (36.8 C) (Oral)   Resp 19   Ht 5\' 4"  (1.626 m)   Wt 79.4 kg   LMP 01/23/2018   SpO2 97%   BMI 30.04 kg/m   Physical Exam Vitals and nursing note reviewed.  Constitutional:      General: She is not in acute distress.    Appearance: She is not ill-appearing.  HENT:     Head: Normocephalic.     Mouth/Throat:     Comments: Airway patent.  No tongue swelling.  No angioedema to lips. Eyes:     Pupils: Pupils are equal, round, and reactive to light.     Comments: Swelling along bilateral eyes (Right > left). See photos below.  Cardiovascular:     Rate and Rhythm: Normal rate and regular rhythm.     Pulses: Normal pulses.     Heart sounds: Normal heart sounds. No murmur heard. No friction rub. No gallop.   Pulmonary:     Effort: Pulmonary effort is normal.     Breath sounds: Normal breath sounds.     Comments: Respirations equal and unlabored, patient able to speak in full sentences, lungs clear to auscultation bilaterally.  No stridor or wheeze. Abdominal:     General: Abdomen is flat. There is no distension.     Palpations: Abdomen is soft.     Tenderness: There is no abdominal tenderness. There is no guarding or rebound.  Musculoskeletal:        General: Normal range of motion.     Cervical back: Neck supple.  Skin:    General: Skin is warm and dry.  Neurological:     General: No focal deficit present.     Mental Status: She is alert.  Psychiatric:        Mood and Affect: Mood normal.        Behavior: Behavior normal.       ED Results / Procedures / Treatments   Labs (all  labs ordered are listed, but only abnormal results are displayed) Labs Reviewed - No data to display  EKG None  Radiology No results found.  Procedures Procedures   Medications Ordered in ED Medications  0.9 %  sodium chloride infusion ( Intravenous New Bag/Given 04/26/20 2119)  famotidine (PEPCID) IVPB 20 mg premix (0 mg Intravenous Stopped 04/26/20 2208)  methylPREDNISolone sodium succinate (SOLU-MEDROL) 125 mg/2 mL injection 125 mg (125 mg Intravenous Given 04/26/20 2122)  diphenhydrAMINE (BENADRYL) injection 25 mg (25 mg Intravenous Given 04/26/20 2335)    ED Course  I have reviewed the triage vital signs and the nursing notes.  Pertinent labs & imaging results that were available during my care of the patient were reviewed by me and considered in my medical decision making (see chart for details).    MDM Rules/Calculators/A&P                         49 year old female presents to the ED due to allergic reaction due to possible seafood contamination.  History of seafood allergy.  No history of anaphylaxis.  No difficulties breathing, throat closing sensation, rash, abdominal pain, nausea, or vomiting.  Patient took Benadryl prior to arrival.  Upon arrival, stable vitals.  Patient in no acute distress and non-ill-appearing.  Physical exam reassuring.  Lungs clear to auscultation bilaterally.  No respiratory distress.  No stridor or wheeze.  Airway patent.  No tongue swelling.  Bilateral eye edema.  IV Solu-Medrol and Pepcid given.    9:51 PM reassessed patient at bedside. No respiratory distress. Airway patent. Mild improvement in edema.   11:35 PM reassessed patient at bedside. No respiratory distress. No wheeze or stridor. Edema has improved further. IV benadryl given.  Patient observed in the ED for over 3 hours with no signs of respiratory distress or worsening allergic reaction. Patient discharged with prednisone, pepcid, and benadryl. Patient also discharged with an EpiPen.  Strict ED precautions discussed with patient. Patient states understanding and agrees to plan. Patient discharged home in no acute distress and stable vitals  Final Clinical Impression(s) / ED Diagnoses Final diagnoses:  Allergic reaction, initial encounter    Rx / DC Orders ED Discharge Orders         Ordered    predniSONE (DELTASONE) 20 MG tablet  Daily        04/26/20 2310    famotidine (PEPCID) 20 MG tablet  Daily        04/26/20 2310    diphenhydrAMINE (BENADRYL) 25 MG tablet  Every 6 hours PRN        04/26/20 2310    EPINEPHrine 0.3 mg/0.3 mL IJ SOAJ injection  As needed        04/26/20 2336           Karie Kirks 04/26/20 2351    Drenda Freeze, MD 04/27/20 [phone]

## 2020-05-01 ENCOUNTER — Other Ambulatory Visit: Payer: Self-pay

## 2020-05-01 ENCOUNTER — Telehealth (INDEPENDENT_AMBULATORY_CARE_PROVIDER_SITE_OTHER): Payer: BC Managed Care – PPO | Admitting: Family Medicine

## 2020-05-01 ENCOUNTER — Encounter: Payer: Self-pay | Admitting: Family Medicine

## 2020-05-01 DIAGNOSIS — Z91018 Allergy to other foods: Secondary | ICD-10-CM | POA: Diagnosis not present

## 2020-05-01 DIAGNOSIS — E785 Hyperlipidemia, unspecified: Secondary | ICD-10-CM | POA: Diagnosis not present

## 2020-05-01 MED ORDER — ROSUVASTATIN CALCIUM 5 MG PO TABS: 5.0000 mg | ORAL_TABLET | Freq: Every day | ORAL | 3 refills | Status: DC

## 2020-05-01 NOTE — Progress Notes (Signed)
VIRTUAL VISIT VIA VIDEO  I connected with Ruth [name] on 05/01/20 at 10:00 AM EDT by elemedicine application and verified that I am speaking with the correct person using two identifiers. Location patient: Home Location provider: [facility], Office Persons participating in the virtual visit: Patient, Dr. Raoul Pitch and Darnell Level. Cesar, CMA  I discussed the limitations of evaluation and management by telemedicine and the availability of in person appointments. The patient expressed understanding and agreed to proceed.   SUBJECTIVE Chief Complaint  Patient presents with  . Allergic Reaction    HPI: Ruth [name] is a 50 y.o. female present to follow-up on ED visit after allergic reaction.  She states that she had ordered takeout from a restaurant and within a few minutes she noticed swelling in her face.  She states it occurred once prior when she consumed seafood.  She states that her meal did not contain any seafood, so she is uncertain if it was contaminated with seafood or if she has other allergies.  She went to the emergency room and was treated with antihistamines and steroids.  She states her symptoms have completely resolved but she would like allergy testing.  ROS: See pertinent positives and negatives per HPI.  Patient Active Problem List   Diagnosis Date Noted  . Food allergy 05/01/2020  . Obesity (BMI 30.0-34.9) 08/01/2019  . Eustachian tube dysfunction, bilateral 11/12/2016  . Lateral epicondylitis of left elbow 11/12/2016  . Right wrist pain 11/12/2016  . Thyromegaly 05/12/2016  . Perimenopause 05/12/2016  . Thyroid nodule 05/12/2016  . Carpal tunnel syndrome, right 04/06/2016  . Hyperlipidemia LDL goal <130 06/30/2015  . GERD (gastroesophageal reflux disease) 06/16/2015  . Hot flashes 06/11/2014  . Vitamin D deficiency 05/10/2014    Social History   Tobacco Use  . Smoking status: Never Smoker  . Smokeless tobacco: Never Used  Substance Use Topics   . Alcohol use: No    Comment: occasional    Current Outpatient Medications:  .  diphenhydrAMINE (BENADRYL) 25 MG tablet, Take 1 tablet (25 mg total) by mouth every 6 (six) hours as needed., Disp: 30 tablet, Rfl: 0 .  EPINEPHrine 0.3 mg/0.3 mL IJ SOAJ injection, Inject 0.3 mg into the muscle as needed for anaphylaxis., Disp: 1 each, Rfl: 0 .  famotidine (PEPCID) 20 MG tablet, Take 1 tablet (20 mg total) by mouth daily for 5 days., Disp: 5 tablet, Rfl: 0 .  fluticasone (FLONASE) 50 MCG/ACT nasal spray, Place 2 sprays into both nostrils daily., Disp: 16 g, Rfl: 6 .  predniSONE (DELTASONE) 20 MG tablet, Take 2 tablets (40 mg total) by mouth daily for 5 days., Disp: 10 tablet, Rfl: 0 .  rosuvastatin (CRESTOR) 5 MG tablet, Take 1 tablet (5 mg total) by mouth at bedtime., Disp: 90 tablet, Rfl: 3  Allergies  Allergen Reactions  . Other Anaphylaxis    Seafood    . Amoxicillin Rash  . Shellfish-Derived Products Rash and Swelling    OBJECTIVE: LMP 01/23/2018  Gen: No acute distress. Nontoxic in appearance.  HENT: AT. [place].  MMM.  Eyes:Pupils Equal Round Reactive to light, Extraocular movements intact,  Conjunctiva without redness, discharge or icterus. Chest: Cough not presnt Skin: no rashes, purpura or petechiae.  Neuro:  Normal gait. Alert. Oriented x3  Psych: Normal affect and demeanor. Normal speech. Normal thought content and judgment.  ASSESSMENT AND PLAN: Ruth [name] is a 50 y.o. female present for  Food allergy Resolved with benadryl, prednisone and pepcid.  Possibly new allergy for her or cross contamination with seafood.  Referred to allergy and asthma - OR Instructed her to pick up the epi-pen and carry it and Benadryl with her at all times. Encouraged her to take benadryl 50 mg at any sign of recurrence. Use epi pen if lip/tongue, breathing difficulties/swelling.    Hyperlipidemia LDL goal <130 Could not tolerate lipitor- nausea/  DC liptior, start crestor 5 mg   qhs F/u 3 mos with provider fasting.     Howard Pouch, DO 05/01/2020   Return in about 3 months (around 07/31/2020) for [facility] (30 min).  Orders Placed This Encounter  Procedures  . Ambulatory referral to Allergy   Meds ordered this encounter  Medications  . rosuvastatin (CRESTOR) 5 MG tablet    Sig: Take 1 tablet (5 mg total) by mouth at bedtime.    Dispense:  90 tablet    Refill:  3    DC lipitor    Referral Orders     Ambulatory referral to Allergy

## 2020-05-01 NOTE — Patient Instructions (Signed)
Angioedema Angioedema is swelling in the body. The swelling can occur in any part of the body. It often happens on the skin. It may cause itchy, bumpy patches (hives) to form. This condition may:  Occur only one time.  Happen more than one time. It can also stop at any time.  Keep coming back for a number of years. Someday it may stop. What are the causes? This condition may be caused by:  Foods, such as milk, eggs, shellfish, wheat, or nuts.  Medicines, such as ACE inhibitors, antibiotics, NSAIDs, birth control pills, or dyes used in X-rays. Hereditary angioedema (HAE) is passed from parent to child. Symptoms can occur because of:  Illness, infection, or stress.  Changes in hormones.  Exercise.  Minor surgery.  Dental work. In some cases, the cause of this condition may not be known. What increases the risk? You are more likely to have HAE if you have family members with this condition. What are the signs or symptoms? Symptoms of this condition include:  Swollen skin.  Red, itchy patches of skin.  Pain, pressure, or tenderness in the affected area.  Swollen eyelids, face, lips, or tongue.  Wheezing.  Trouble drinking, swallowing, or closing the mouth completely.  Being hoarse or having a sore throat.  Problems breathing. If your organs are affected:  You may feel like vomiting.  You may have pain in your belly.  You may vomit or have watery poop (diarrhea).  You may have trouble swallowing.  You may have trouble peeing.   How is this treated? To treat this condition, you may be told:  To avoid things that cause attacks (triggers). These include foods or things that cause allergies.  To stop medicines that cause the condition.  To take medicines to treat the condition. In very bad cases, a breathing tube or a machine that helps with breathing (ventilator) may be used. Follow these instructions at home:  Take all medicines only as told by your  doctor.  If you were given medicines to treat allergies, always carry them with you.  Wear a medical bracelet as told by your doctor.  Avoid the things that cause attacks. These may include: ? Foods. ? Things in your environment (such as pollen). ? Stress. ? Exercise.  Avoid all medicines that caused the attacks.  Talk to your doctor before you have kids. Some types of this condition may be passed from parent to child.   Where to find more information  American Academy of Allergy Asthma & Immunology: www.aaaai.org Contact a doctor if:  You have another attack.  Your attacks happen more often, even after you take steps to prevent them.  Your attacks are worse every time they occur.  This condition was passed to you by your parents and you want to have kids. Get help right away if:  Your mouth, tongue, or lips get very swollen.  Your swelling becomes worse.  You have trouble breathing.  You have trouble swallowing.  You have trouble talking.  You have chest pain or you feel dizzy.  You faint. These symptoms may be an emergency. Do not wait to see if the symptoms will go away. Get help right away. Call your local emergency services (911 in the [place] Do not drive yourself to the hospital. Summary  Angioedema is swelling that can happen in any part of the body.  It can be caused by the food you eat or the medicines you are taking. It can also be   passed from parent to child.  Avoid the things that cause your attacks. These can be food, medicines, or things in your environment.  If you were given medicines for allergies, always carry them with you.  Get help right away if your mouth, tongue, or lips get swollen. Also, get help right away if you have trouble breathing or swallowing. This information is not intended to replace advice given to you by your health care provider. Make sure you discuss any questions you have with your health care provider. Document Revised:  11/28/2018 Document Reviewed: 11/28/2018 Elsevier Patient Education  2021 Elsevier Inc.  

## 2020-06-21 ENCOUNTER — Other Ambulatory Visit: Payer: Self-pay | Admitting: Family Medicine

## 2020-06-24 ENCOUNTER — Ambulatory Visit: Payer: BC Managed Care – PPO | Admitting: Allergy

## 2020-06-24 ENCOUNTER — Other Ambulatory Visit: Payer: Self-pay

## 2020-06-24 ENCOUNTER — Encounter: Payer: Self-pay | Admitting: Allergy

## 2020-06-24 VITALS — BP 124/78 | HR 60 | Temp 97.7°F | Resp 16 | Ht 65.0 in | Wt 178.5 lb

## 2020-06-24 DIAGNOSIS — T7809XD Anaphylactic reaction due to other food products, subsequent encounter: Secondary | ICD-10-CM

## 2020-06-24 DIAGNOSIS — J3089 Other allergic rhinitis: Secondary | ICD-10-CM | POA: Diagnosis not present

## 2020-06-24 NOTE — Progress Notes (Signed)
New Patient Note  RE: Ruth [name] MRN: [id number] DOB: 23-Dec-1970 Date of Office Visit: 06/24/2020  Consult requested by: [name], DO Primary care provider: [name], DO  Chief Complaint: Allergic Reaction and Allergy Testing  History of Present Illness: I had the pleasure of seeing Ruth [name] for initial evaluation at the [facility] [place] [facility] [place] on 06/24/2020. She is a 50 y.o. female, who is referred here by Howard Pouch A, DO for the evaluation of food allergies.  Patient went out to eat at [facility] - she had spaghetti with chicken which she had the same dish previously with no issues.  About 1 hour later, she had some eye itching and eye swelling. She took benadryl at home which did not help. Then her other eye started to itch which prompted her to go to the ER. She was treated with antihistamines and prednisone with good benefit.   She usually has these reactions if she is exposed to seafood. She called [employer] and was told that there was no cross-contamination with seafood that they were aware of.   She had spaghetti and chicken since then with no issues.  Denies being outdoors a lot that day.  She had some dental work done 3 days before with no issues.   Denies any associated cofactors such as exertion, infection, NSAID use. The symptoms lasted for a 1 day. She was evaluated in ED and received prednisone, Pepcid, benadryl. She does have access to epinephrine autoinjector and not needed to use it.   She reports food allergy to seafood. The reaction occurred as a child. Even the smell of the fish being cooked makes her feel nauseous and gets itchy eyes. No prior work up.   Dietary History: patient has been eating other foods including limited milk, eggs, peanut, treenuts, sesame, soy, wheat, meats, fruits and vegetables.  Soy milk causes some GI discomfort.   She reports reading labels and avoiding seafood in diet completely.    05/01/2020 PCP visit: "HPI: Ruth [name] is a 50 y.o. female present to follow-up on ED visit after allergic reaction.  She states that she had ordered takeout from a restaurant and within a few minutes she noticed swelling in her face.  She states it occurred once prior when she consumed seafood.  She states that her meal did not contain any seafood, so she is uncertain if it was contaminated with seafood or if she has other allergies.  She went to the emergency room and was treated with antihistamines and steroids.  She states her symptoms have completely resolved but she would like allergy testing."  04/26/2020 ER visit: "Ruth [name] is a 50 y.o. female with a past medical history significant for hyperlipidemia, GERD, asthma, and seasonal allergies who presents to the ED due to bilateral eye swelling.  Patient states swelling started in her right eye and progressively moved to her left eye roughly 2 hours prior to arrival.  Patient notes she has a seafood allergy and has had similar reactions in the remote past. No history of anaphylaxis.  Patient notes she ate [employer] roughly 2 hours prior to onset of symptoms.  No known seafood ingestion.  Denies difficulties breathing, throat closing sensation, rash, abdominal pain, nausea, or vomiting.  She used eyedrops in her right eye with no relief.  She also took a Benadryl prior to arrival with no change in edema.  Denies new lotion, medications, make-up, or facial products."  Assessment and Plan: [name] is a 50 y.o. female with: Anaphylactic reaction due to other food products, subsequent encounter History of reaction to seafood since a young child but no formal work up. Recently ate at [employer] (spaghetti with chicken) and 1 hour afterwards noticed itchy eyes and periorbital swelling despite taking benadryl at home. ER gave her more antihistamines and prednisone and symptoms resolved within 1 day. Denies any cross contamination to  seafood and she has eaten spaghetti and chicken since then with no issues. Denies any other changes in diet, meds or personal care products. Denies being outdoors a lot that day.  Today's skin testing showed: Borderline positive to fish mix, shellfish mix and bass. Based on history, concern for cross-contamination with seafood.  Continue strict avoidance of seafood. Get bloodwork as skin prick testing was only borderline positive.  For mild symptoms you can take over the counter antihistamines such as Benadryl and monitor symptoms closely. If symptoms worsen or if you have severe symptoms including breathing issues, throat closure, significant swelling, whole body hives, severe diarrhea and vomiting, lightheadedness then inject epinephrine and seek immediate medical care afterwards. Action plan given.   Other allergic rhinitis Mild rhinorrhea symptoms in the fall and spring.  Takes over-the-counter antihistamines with good benefit no prior work-up. Declines environmental allergy testing today. Start environmental control measures as below. Use over the counter antihistamines such as Zyrtec (cetirizine), Claritin (loratadine), Allegra (fexofenadine), or Xyzal (levocetirizine) daily as needed.  If not controlled, consider testing in the future.   Return in about 1 year (around 06/24/2021).  No orders of the defined types were placed in this encounter.  Lab Orders  Allergen Profile, Shellfish  Allergen Profile, Food-Fish    Other allergy screening: Asthma: yes as a child but no inhaler use for many years.  Rhino conjunctivitis: yes Some rhinorrhea in the fall and spring.  Medication allergy: yes Amoxicillin - rash.  Hymenoptera allergy: no Urticaria: no Eczema:no History of recurrent infections suggestive of immunodeficency: no  Diagnostics: Skin Testing: Select foods. Borderline positive to fish mix, shellfish mix and bass. Results discussed with patient/family.  Food Adult Perc -  06/24/20 0900     Time Antigen Placed 6440    Allergen Manufacturer Lavella Hammock    Location Back    Number of allergen test 16     Control-buffer 50% Glycerol Negative    Control-Histamine 1 mg/ml 2+    8. Shellfish Mix --   +/-   9. Fish Mix --   4x3   18. Catfish Negative    19. Bass --   +/-   20. Trout Negative    21. Tuna Negative    22. Salmon Negative    23. Flounder Negative    24. Codfish Negative    25. Shrimp Negative    26. Crab Negative    27. Lobster Negative    28. Oyster Negative    29. Scallops Negative             Past Medical History: Patient Active Problem List   Diagnosis Date Noted   Anaphylactic reaction due to other food products, subsequent encounter 06/24/2020   Other allergic rhinitis 06/24/2020   Food allergy 05/01/2020   Obesity (BMI 30.0-34.9) 08/01/2019   Eustachian tube dysfunction, bilateral 11/12/2016   Lateral epicondylitis of left elbow 11/12/2016   Right wrist pain 11/12/2016   Thyromegaly 05/12/2016   Perimenopause 05/12/2016   Thyroid nodule 05/12/2016   Carpal tunnel syndrome, right 04/06/2016  Hyperlipidemia LDL goal <130 06/30/2015   GERD (gastroesophageal reflux disease) 06/16/2015   Hot flashes 06/11/2014   Vitamin D deficiency 05/10/2014   Past Medical History:  Diagnosis Date   Allergy    Arthritis    Asthma    GERD (gastroesophageal reflux disease)    History of abdominal pain    2007- mild decreased GB EF; 2008-collapsing R ovarian cyst; 2012 uterine fibroid x 1.   HV (hallux valgus)    repaired as child   Hyperlipidemia    Prediabetes    Past Surgical History: Past Surgical History:  Procedure Laterality Date   BUNIONECTOMY Right    performed as child   Medication List:  Current Outpatient Medications  Medication Sig Dispense Refill   diphenhydrAMINE (BENADRYL) 25 MG tablet Take 1 tablet (25 mg total) by mouth every 6 (six) hours as needed. 30 tablet 0   EPINEPHrine 0.3 mg/0.3 mL IJ SOAJ injection  Inject 0.3 mg into the muscle as needed for anaphylaxis. 1 each 0   fluticasone (FLONASE) 50 MCG/ACT nasal spray Place 2 sprays into both nostrils daily. 16 g 6   lansoprazole (PREVACID) 15 MG capsule Take 15 mg by mouth daily as needed.     loratadine (CLARITIN) 10 MG tablet Take 10 mg by mouth daily as needed for allergies.     VITAMIN D PO Take 1 capsule by mouth daily.     famotidine (PEPCID) 20 MG tablet Take 1 tablet (20 mg total) by mouth daily for 5 days. 5 tablet 0   rosuvastatin (CRESTOR) 5 MG tablet Take 1 tablet (5 mg total) by mouth at bedtime. (Patient not taking: No sig reported) 90 tablet 3   No current facility-administered medications for this visit.   Allergies: Allergies  Allergen Reactions   Other Anaphylaxis    Seafood     Amoxicillin Rash   Shellfish-Derived Products Rash and Swelling   Social History: Social History   Socioeconomic History   Marital status: Single    Spouse name: Not on file   Number of children: 0   Years of education: Not on file   Highest education level: Not on file  Occupational History   Occupation: Pharmacist, hospital    Comment: HS, basketball coach  Tobacco Use   Smoking status: Never   Smokeless tobacco: Never  Vaping Use   Vaping Use: Never used  Substance and Sexual Activity   Alcohol use: No    Comment: occasional   Drug use: No   Sexual activity: Yes  Other Topics Concern   Not on file  Social History Narrative   ** Merged History Encounter **       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 She lives alone.    Social Determinants of Health   Financial Resource Strain: Not on file  Food Insecurity: Not on file  Transportation Needs: Not on file  Physical Activity: Not on file  Stress: Not on file  Social Connections: Not on file   Lives in a 51 year old house. Smoking: denies Occupation: high Chief of Staff History: Water Damage/mildew in the house: yes Carpet in the family room: no Carpet in the bedroom:  no Heating:  oil Cooling: window Pet: yes 2 cats x 6 and 7 yrs  Family History: Family History  Problem Relation Age of Onset   Hypertension Mother    Alcohol abuse Father    Glaucoma Maternal Grandmother    Asthma Maternal Grandfather    Cancer Paternal Grandfather  lung   Review of Systems  Constitutional:  Negative for appetite change, chills, fever and unexpected weight change.  HENT:  Positive for rhinorrhea. Negative for congestion.   Eyes:  Negative for itching.  Respiratory:  Negative for cough, chest tightness, shortness of breath and wheezing.   Cardiovascular:  Negative for chest pain.  Gastrointestinal:  Negative for abdominal pain.  Genitourinary:  Negative for difficulty urinating.  Skin:  Negative for rash.  Allergic/Immunologic: Positive for food allergies.  Neurological:  Negative for headaches.  Objective: BP 124/78   Pulse 60   Temp 97.7 F (36.5 C) (Temporal)   Resp 16   Ht 5\' 5"  (1.651 m)   Wt 178 lb 8 oz (81 kg)   LMP 01/23/2018   SpO2 98%   BMI 29.70 kg/m  Body mass index is 29.7 kg/m. Physical Exam Vitals and nursing note reviewed.  Constitutional:      Appearance: Normal appearance. She is well-developed.  HENT:     Head: Normocephalic and atraumatic.     Right Ear: External ear normal.     Left Ear: External ear normal.     Nose: Nose normal.     Mouth/Throat:     Mouth: Mucous membranes are moist.     Pharynx: Oropharynx is clear.  Eyes:     Conjunctiva/sclera: Conjunctivae normal.  Cardiovascular:     Rate and Rhythm: Normal rate and regular rhythm.     Heart sounds: Normal heart sounds. No murmur heard.   No friction rub. No gallop.  Pulmonary:     Effort: Pulmonary effort is normal.     Breath sounds: Normal breath sounds. No wheezing, rhonchi or rales.  Abdominal:     Palpations: Abdomen is soft.  Musculoskeletal:     Cervical back: Neck supple.  Skin:    General: Skin is warm.     Findings: No rash.   Neurological:     Mental Status: She is alert and oriented to person, place, and time.  Psychiatric:        Behavior: Behavior normal.   The plan was reviewed with the patient/family, and all questions/concerned were addressed.  It was my pleasure to see Jazilyn today and participate in her care. Please feel free to contact me with any questions or concerns.  Sincerely,  Rexene Alberts, DO Allergy & Immunology  [facility] office: [place] office: [phone]

## 2020-06-24 NOTE — Assessment & Plan Note (Signed)
Mild rhinorrhea symptoms in the fall and spring.  Takes over-the-counter antihistamines with good benefit no prior work-up.  Declines environmental allergy testing today.  Start environmental control measures as below.  Use over the counter antihistamines such as Zyrtec (cetirizine), Claritin (loratadine), Allegra (fexofenadine), or Xyzal (levocetirizine) daily as needed.   If not controlled, consider testing in the future.

## 2020-06-24 NOTE — Assessment & Plan Note (Signed)
History of reaction to seafood since a young child but no formal work up. Recently ate at [employer] (spaghetti with chicken) and 1 hour afterwards noticed itchy eyes and periorbital swelling despite taking benadryl at home. ER gave her more antihistamines and prednisone and symptoms resolved within 1 day. Denies any cross contamination to seafood and she has eaten spaghetti and chicken since then with no issues. Denies any other changes in diet, meds or personal care products. Denies being outdoors a lot that day.   Today's skin testing showed: Borderline positive to fish mix, shellfish mix and bass. . Based on history, concern for cross-contamination with seafood.  . Continue strict avoidance of seafood. . Get bloodwork as skin prick testing was only borderline positive.  . For mild symptoms you can take over the counter antihistamines such as Benadryl and monitor symptoms closely. If symptoms worsen or if you have severe symptoms including breathing issues, throat closure, significant swelling, whole body hives, severe diarrhea and vomiting, lightheadedness then inject epinephrine and seek immediate medical care afterwards. . Action plan given.

## 2020-06-24 NOTE — Patient Instructions (Addendum)
Today's skin testing showed: Borderline positive to fish mix, shellfish mix and bass.  Allergic reaction: Concern for cross-contamination. Continue strict avoidance of seafood. Get bloodwork:  We are ordering labs, so please allow 1-2 weeks for the results to come back. With the newly implemented Cures Act, the labs might be visible to you at the same time that they become visible to me. However, I will not address the results until all of the results are back, so please be patient.  In the meantime, continue recommendations in your patient instructions, including avoidance measures (if applicable), until you hear from me. For mild symptoms you can take over the counter antihistamines such as Benadryl and monitor symptoms closely. If symptoms worsen or if you have severe symptoms including breathing issues, throat closure, significant swelling, whole body hives, severe diarrhea and vomiting, lightheadedness then inject epinephrine and seek immediate medical care afterwards. Action plan given.   Environmental allergies Start environmental control measures as below. Use over the counter antihistamines such as Zyrtec (cetirizine), Claritin (loratadine), Allegra (fexofenadine), or Xyzal (levocetirizine) daily as needed.  If not controlled, consider testing in the future.   Follow up in 12 months or sooner if needed.   Reducing Pollen Exposure Pollen seasons: trees (spring), grass (summer) and ragweed/weeds (fall). Keep windows closed in your home and car to lower pollen exposure.  Install air conditioning in the bedroom and throughout the house if possible.  Avoid going out in dry windy days - especially early morning. Pollen counts are highest between 5 - 10 AM and on dry, hot and windy days.  Save outside activities for late afternoon or after a heavy rain, when pollen levels are lower.  Avoid mowing of grass if you have grass pollen allergy. Be aware that pollen can also be transported  indoors on people and pets.  Dry your clothes in an automatic dryer rather than hanging them outside where they might collect pollen.  Rinse hair and eyes before bedtime.

## 2020-08-15 ENCOUNTER — Encounter: Payer: Self-pay | Admitting: Family Medicine

## 2020-08-15 ENCOUNTER — Other Ambulatory Visit: Payer: Self-pay

## 2020-08-15 ENCOUNTER — Ambulatory Visit: Payer: BC Managed Care – PPO | Admitting: Family Medicine

## 2020-08-15 VITALS — BP 116/75 | HR 63 | Temp 97.6°F | Ht 65.0 in | Wt 179.0 lb

## 2020-08-15 DIAGNOSIS — J320 Chronic maxillary sinusitis: Secondary | ICD-10-CM | POA: Diagnosis not present

## 2020-08-15 DIAGNOSIS — R519 Headache, unspecified: Secondary | ICD-10-CM

## 2020-08-15 MED ORDER — BUDESONIDE 0.5 MG/2ML IN SUSP: RESPIRATORY_TRACT | 12 refills | Status: DC

## 2020-08-15 MED ORDER — AZITHROMYCIN 250 MG PO TABS: ORAL_TABLET | ORAL | 0 refills | Status: AC

## 2020-08-15 NOTE — Progress Notes (Signed)
This visit occurred during the SARS-CoV-2 public health emergency.  Safety protocols were in place, including screening questions prior to the visit, additional usage of staff PPE, and extensive cleaning of exam room while observing appropriate contact time as indicated for disinfecting solutions.    Ruth [name] , 1970/11/13, 50 y.o., female MRN: [id number] Patient Care Team    Relationship Specialty Notifications Start End  [name], DO PCP - General Family Medicine  09/25/14   [name], MD Referring Physician Specialist  06/30/15     Chief Complaint  Patient presents with   Muscle Pain    Pt c/o muscle fatigue, sinus pressure x 2 weeks; recently seen dentist and was told to follow up regarding the issue     Subjective: Pt presents for an OV with complaints of right-sided maxillary sinus pressure.  She has been struggling with right-sided facial discomfort for some time.  She has been seen by her dental team has had tooth extractions.  She has been seen by ENT.  She most recently was seen by an endodontist who did not feel it was a problem with her teeth.  He had performed a panoramic x-ray and thought he seen swelling within the right maxillary sinus cavity.  In his opinion her discomfort was coming from chronic right maxillary sinusitis. Patient has been using her Nettie pot as instructed.  She has continue the [facility] and feels that it has helped a small fraction.  Patient also states she had left arm muscle fatigue when brushing her hair a few weeks ago.  It was temporary and has passed.  She had concerns and wants to have her physical completed with her routine labs to ensure everything is okay.  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 She lives alone.    Past Medical History:  Diagnosis Date   Allergy    Arthritis    Asthma    GERD (gastroesophageal reflux disease)    History of abdominal pain    2007- mild decreased GB EF; 2008-collapsing R ovarian cyst; 2012 uterine fibroid x 1.   HV (hallux valgus)    repaired as child   Hyperlipidemia    Prediabetes    Past Surgical History:  Procedure Laterality Date   BUNIONECTOMY Right    performed as child   Family History  Problem Relation Age of Onset   Hypertension Mother    Alcohol abuse Father    Glaucoma Maternal Grandmother    Asthma Maternal Grandfather    Cancer Paternal Grandfather        lung   Allergies as of 08/15/2020       Reactions   Other Anaphylaxis   Seafood    Amoxicillin Rash   Shellfish-derived Products Rash, Swelling        Medication List        Accurate as of August 15, 2020  5:54 PM. If you have any questions, ask your nurse or doctor.  STOP taking these medications    diphenhydrAMINE 25 MG tablet Commonly known as: BENADRYL Stopped by: Howard Pouch, DO   famotidine 20 MG tablet Commonly known as: PEPCID Stopped by: Howard Pouch, DO   loratadine 10 MG tablet Commonly known as: CLARITIN Stopped by: Howard Pouch, DO       TAKE these medications    azithromycin 250 MG tablet Commonly known as: ZITHROMAX Take 2 tablets on day 1, then 1 tablet daily on days 2 through 5 Started by: Howard Pouch, DO   budesonide 0.5 MG/2ML nebulizer solution Commonly known as: PULMICORT Mix 2 ml ampule with 8 ounces of water in nettie pot and flush nasal passages qd. Started by: Howard Pouch, DO   EPINEPHrine 0.3 mg/0.3 mL Soaj injection Commonly known as: EPI-PEN Inject 0.3 mg into the muscle as needed for anaphylaxis.   fluticasone 50 MCG/ACT nasal  spray Commonly known as: FLONASE SPRAY 2 SPRAYS INTO EACH NOSTRIL EVERY DAY   lansoprazole 15 MG capsule Commonly known as: PREVACID Take 15 mg by mouth daily as needed.   rosuvastatin 5 MG tablet Commonly known as: Crestor Take 1 tablet (5 mg total) by mouth at bedtime.   VITAMIN D PO Take 1 capsule by mouth daily.        All past medical history, surgical history, allergies, family history, immunizations andmedications were updated in the EMR today and reviewed under the history and medication portions of their EMR.     ROS: Negative, with the exception of above mentioned in HPI   Objective:  BP 116/75   Pulse 63   Temp 97.6 F (36.4 C) (Oral)   Ht '5\' 5"'$  (1.651 m)   Wt 179 lb (81.2 kg)   LMP 01/23/2018   SpO2 99%   BMI 29.79 kg/m  Body mass index is 29.79 kg/m. Gen: Afebrile. No acute distress. Nontoxic in appearance, well developed, well nourished.  HENT: AT. [place]. Bilateral TM visualized without erythema or bulging.  EAC normal bilaterally.. MMM, no oral lesions. Bilateral nares with mild swelling, no erythema or drainage.. Throat without erythema or exudates.  No cough.  No hoarseness.  Mild fullness and tenderness to palpation over right maxillary sinus. Eyes:Pupils Equal Round Reactive to light, Extraocular movements intact,  Conjunctiva without redness, discharge or icterus. Neck/lymp/endocrine: Supple, no lymphadenopathy CV: RRR  Chest: CTAB, no wheeze or crackles.   Neuro:  Normal gait. PERLA. EOMi. Alert. Oriented x3  Psych: Normal affect, dress and demeanor. Normal speech. Normal thought content and judgment.  No results found. No results found. No results found for this or any previous visit (from the past 24 hour(s)).  Assessment/Plan: Ruth [name] is a 50 y.o. female present for OV for  Chronic maxillary sinusitis/Facial pain Agree with diagnosis of chronic maxillary sinusitis.  We discussed options today and elected to try Pulmicort/saline  nasal rinses daily and continue Flonase once daily in the evening. Z-Pak prescribed, mostly for anti-inflammatory properties. Follow-up in 4 weeks for CPE and recheck on maxillary sinus.  If she is not seeing improvement would consider moving forward with CT and second opinion for ENT.  We briefly discussed sinus endoscopy and procedures for chronic sinusitis.  Please touch base on her muscle weakness.  It was temporary and has resolved.  We will do a full panel of her blood work during her CPE in 4 weeks.  She is in agreement with this approach.  Reviewed expectations re: course of current medical issues. Discussed self-management of symptoms. Outlined  signs and symptoms indicating need for more acute intervention. Patient verbalized understanding and all questions were answered. Patient received an After-Visit Summary.    No orders of the defined types were placed in this encounter.  Meds ordered this encounter  Medications   budesonide (PULMICORT) 0.5 MG/2ML nebulizer solution    Sig: Mix 2 ml ampule with 8 ounces of water in nettie pot and flush nasal passages qd.    Dispense:  30 mL    Refill:  12   azithromycin (ZITHROMAX) 250 MG tablet    Sig: Take 2 tablets on day 1, then 1 tablet daily on days 2 through 5    Dispense:  6 tablet    Refill:  0   Referral Orders  No referral(s) requested today     Note is dictated utilizing voice recognition software. Although note has been proof read prior to signing, occasional typographical errors still can be missed. If any questions arise, please do not hesitate to call for verification.   electronically signed by:  Howard Pouch, DO  [place]

## 2020-08-15 NOTE — Patient Instructions (Signed)
[place] otolaryngology: head and neck surgery (7th ed., pp. [phone]). Elsevier.">  Sinus Endoscopy  Sinus endoscopy is a procedure that may be used to check for and treat problems with the sinuses. The sinuses are air-filled spaces in the skull that arebehind the bones of the face and forehead. They open into the nasal cavity. For this procedure, a thin, lighted device (endoscope) is passed through the nose and into the sinuses. The endoscope has a small camera that sends images to a screen in the room. It allows your health care provider to look into the nose and sinuses and check for the cause of any problems you are having. During the endoscopy, your health care provider may also do procedures to treat any problems found. This might include widening the passageway between your sinuses and your nose to improve drainage, or removingabnormalities such as polyps. Tell a health care provider about: Any allergies you have. All medicines you are taking, including vitamins, herbs, eye drops, creams, and over-the-counter medicines. Any problems you or family members have had with anesthetic medicines. Any blood disorders you have. Any surgeries you have had. Any medical conditions you have. Whether you are pregnant or may be pregnant. What are the risks? Generally, this is a safe procedure. However, problems may occur, including: Bleeding. Infection. Allergic reactions to medicines. Damage to the nasal passage or tissue lining the sinuses. Fainting. What happens before the procedure? General instructions You may have a CT scan of your sinuses. If you will be given a sedative or a general anesthetic during the procedure, you should plan to have a responsible adult take you home from the hospital or clinic. Medicines You may be asked to use one or more types of nose spray before the procedure to prepare your nasal passages. Use these as told by your health care provider. This is important. Ask  your health care provider about: Taking over-the-counter medicines, vitamins, herbs, and supplements. Changing or stopping your regular medicines. This is especially important if you are taking diabetes medicines or blood thinners. Taking medicines such as aspirin and ibuprofen. These medicines can thin your blood. Do not take these medicines before your procedure unless your health care provider tells you to. Eating and drinking restrictions Follow instructions from your health care provider about eating and drinking, which may include: 8 hours before the procedure - stop eating heavy meals or foods such as meat, fried foods, or fatty foods. 6 hours before the procedure - stop eating light meals or foods, such as toast or cereal. 6 hours before the procedure - stop drinking milk or drinks that contain milk. 2 hours before the procedure - stop drinking clear liquids. What happens during the procedure?  You may have an IV inserted into one of your veins. You may be given one or more of the following: A medicine to help you relax (sedative). A medicine to numb the area (local anesthetic). A medicine to make you fall asleep (general anesthetic). The endoscope will be inserted into one nostril at a time. It will be passed through your nose to view your sinuses. Pictures may be taken of any abnormalities that are found in your sinuses. Treatments of these abnormalities may also be performed through the endoscope, such as removing polyps or widening the opening of the sinuses into the nose. The procedure may vary among health care providers and hospitals. What happens after the procedure? Your blood pressure, heart rate, breathing rate, and blood oxygen level will be monitored until  you leave the hospital or clinic. You may have to wait to eat or drink until the medicines you were given have worn off. Do not drive for 24 hours if you were given a sedative. Summary Sinus endoscopy is a procedure  that may be used to check for and treat problems with the sinuses. For this procedure, a thin, lighted device (endoscope) is passed through the nose and into the sinuses. During the endoscopy, your health care provider may also do procedures to treat any problems found. This information is not intended to replace advice given to you by your health care provider. Make sure you discuss any questions you have with your healthcare provider. Document Revised: 12/20/2019 Document Reviewed: 10/02/2019 Elsevier Patient Education  2022 [employer].

## 2020-09-30 ENCOUNTER — Other Ambulatory Visit: Payer: Self-pay

## 2020-09-30 ENCOUNTER — Ambulatory Visit (INDEPENDENT_AMBULATORY_CARE_PROVIDER_SITE_OTHER): Payer: BC Managed Care – PPO | Admitting: Family Medicine

## 2020-09-30 ENCOUNTER — Ambulatory Visit (INDEPENDENT_AMBULATORY_CARE_PROVIDER_SITE_OTHER): Payer: BC Managed Care – PPO

## 2020-09-30 ENCOUNTER — Encounter: Payer: Self-pay | Admitting: Family Medicine

## 2020-09-30 VITALS — BP 131/77 | HR 62 | Temp 98.3°F | Ht 64.25 in | Wt 175.0 lb

## 2020-09-30 DIAGNOSIS — J320 Chronic maxillary sinusitis: Secondary | ICD-10-CM | POA: Diagnosis not present

## 2020-09-30 DIAGNOSIS — E559 Vitamin D deficiency, unspecified: Secondary | ICD-10-CM

## 2020-09-30 DIAGNOSIS — Z23 Encounter for immunization: Secondary | ICD-10-CM | POA: Diagnosis not present

## 2020-09-30 DIAGNOSIS — Z0001 Encounter for general adult medical examination with abnormal findings: Secondary | ICD-10-CM | POA: Diagnosis not present

## 2020-09-30 DIAGNOSIS — E785 Hyperlipidemia, unspecified: Secondary | ICD-10-CM

## 2020-09-30 DIAGNOSIS — J3089 Other allergic rhinitis: Secondary | ICD-10-CM

## 2020-09-30 DIAGNOSIS — E010 Iodine-deficiency related diffuse (endemic) goiter: Secondary | ICD-10-CM

## 2020-09-30 DIAGNOSIS — Z1211 Encounter for screening for malignant neoplasm of colon: Secondary | ICD-10-CM

## 2020-09-30 DIAGNOSIS — Z0000 Encounter for general adult medical examination without abnormal findings: Secondary | ICD-10-CM | POA: Insufficient documentation

## 2020-09-30 DIAGNOSIS — E663 Overweight: Secondary | ICD-10-CM

## 2020-09-30 DIAGNOSIS — K219 Gastro-esophageal reflux disease without esophagitis: Secondary | ICD-10-CM

## 2020-09-30 LAB — CBC WITH DIFFERENTIAL/PLATELET
Basophils Absolute: 0 10*3/uL (ref 0.0–0.1)
Basophils Relative: 0.7 % (ref 0.0–3.0)
Eosinophils Absolute: 0.1 10*3/uL (ref 0.0–0.7)
Eosinophils Relative: 3.2 % (ref 0.0–5.0)
HCT: 39.1 % (ref 36.0–46.0)
Hemoglobin: 12.3 g/dL (ref 12.0–15.0)
Lymphocytes Relative: 39.5 % (ref 12.0–46.0)
Lymphs Abs: 1.7 10*3/uL (ref 0.7–4.0)
MCHC: 31.6 g/dL (ref 30.0–36.0)
MCV: 81.8 fl (ref 78.0–100.0)
Monocytes Absolute: 0.5 10*3/uL (ref 0.1–1.0)
Monocytes Relative: 11.5 % (ref 3.0–12.0)
Neutro Abs: 1.9 10*3/uL (ref 1.4–7.7)
Neutrophils Relative %: 45.1 % (ref 43.0–77.0)
Platelets: 276 10*3/uL (ref 150.0–400.0)
RBC: 4.77 Mil/uL (ref 3.87–5.11)
RDW: 13.8 % (ref 11.5–15.5)
WBC: 4.2 10*3/uL (ref 4.0–10.5)

## 2020-09-30 LAB — COMPREHENSIVE METABOLIC PANEL
ALT: 20 U/L (ref 0–35)
AST: 23 U/L (ref 0–37)
Albumin: 4.2 g/dL (ref 3.5–5.2)
Alkaline Phosphatase: 98 U/L (ref 39–117)
BUN: 17 mg/dL (ref 6–23)
CO2: 26 mEq/L (ref 19–32)
Calcium: 9.3 mg/dL (ref 8.4–10.5)
Chloride: 106 mEq/L (ref 96–112)
Creatinine, Ser: 0.87 mg/dL (ref 0.40–1.20)
GFR: 77.26 mL/min (ref >60.00)
Glucose, Bld: 83 mg/dL (ref 70–99)
Potassium: 4.3 mEq/L (ref 3.5–5.1)
Sodium: 140 mEq/L (ref 135–145)
Total Bilirubin: 0.3 mg/dL (ref 0.2–1.2)
Total Protein: 6.8 g/dL (ref 6.0–8.3)

## 2020-09-30 LAB — LIPID PANEL
Cholesterol: 229 mg/dL — ABNORMAL HIGH (ref 0–200)
HDL: 52.7 mg/dL (ref >39.00)
LDL Cholesterol: 159 mg/dL — ABNORMAL HIGH (ref 0–99)
NonHDL: 176.68
Total CHOL/HDL Ratio: 4
Triglycerides: 88 mg/dL (ref 0.0–149.0)
VLDL: 17.6 mg/dL (ref 0.0–40.0)

## 2020-09-30 LAB — HEMOGLOBIN A1C: Hgb A1c MFr Bld: 5.9 % (ref 4.6–6.5)

## 2020-09-30 LAB — T4, FREE: Free T4: 0.87 ng/dL (ref 0.60–1.60)

## 2020-09-30 LAB — VITAMIN D 25 HYDROXY (VIT D DEFICIENCY, FRACTURES): VITD: 28.21 ng/mL — ABNORMAL LOW (ref 30.00–100.00)

## 2020-09-30 LAB — TSH: TSH: 1.15 u[IU]/mL (ref 0.35–5.50)

## 2020-09-30 MED ORDER — EZETIMIBE 10 MG PO TABS: 10.0000 mg | ORAL_TABLET | Freq: Every day | ORAL | 3 refills | Status: DC

## 2020-09-30 MED ORDER — FLUTICASONE PROPIONATE 50 MCG/ACT NA SUSP: NASAL | 11 refills | Status: DC

## 2020-09-30 NOTE — Patient Instructions (Addendum)
Great to see you today.  I have refilled the medication(s) we provide.   If labs were collected, we will inform you of lab results once received either by echart message or telephone call.   - echart message- for normal results that have been seen by the patient already.   - telephone call: abnormal results or if patient has not viewed results in their echart.  Stop all statin. Start zetia before bed.for cholesterol- this is not a statin.    Referral placed today- if you do not hear from them, call GI in 1 week.  [facility] Address:  [address], [place], [place] [address] Phone: [phone]  Health Maintenance, Female Adopting a healthy lifestyle and getting preventive care are important in promoting health and wellness. Ask your health care provider about: The right schedule for you to have regular tests and exams. Things you can do on your own to prevent diseases and keep yourself healthy. What should I know about diet, weight, and exercise? Eat a healthy diet  Eat a diet that includes plenty of vegetables, fruits, low-fat dairy products, and lean protein. Do not eat a lot of foods that are high in solid fats, added sugars, or sodium. Maintain a healthy weight Body mass index (BMI) is used to identify weight problems. It estimates body fat based on height and weight. Your health care provider can help determine your BMI and help you achieve or maintain a healthy weight. Get regular exercise Get regular exercise. This is one of the most important things you can do for your health. Most adults should: Exercise for at least 150 minutes each week. The exercise should increase your heart rate and make you sweat (moderate-intensity exercise). Do strengthening exercises at least twice a week. This is in addition to the moderate-intensity exercise. Spend less time sitting. Even light physical activity can be beneficial. Watch cholesterol and blood lipids Have your  blood tested for lipids and cholesterol at 51 years of age, then have this test every 5 years. Have your cholesterol levels checked more often if: Your lipid or cholesterol levels are high. You are older than 51 years of age. You are at high risk for heart disease. What should I know about cancer screening? Depending on your health history and family history, you may need to have cancer screening at various ages. This may include screening for: Breast cancer. Cervical cancer. Colorectal cancer. Skin cancer. Lung cancer. What should I know about heart disease, diabetes, and high blood pressure? Blood pressure and heart disease High blood pressure causes heart disease and increases the risk of stroke. This is more likely to develop in people who have high blood pressure readings, are of African descent, or are overweight. Have your blood pressure checked: Every 3-5 years if you are 56-47 years of age. Every year if you are 74 years old or older. Diabetes Have regular diabetes screenings. This checks your fasting blood sugar level. Have the screening done: Once every three years after age 60 if you are at a normal weight and have a low risk for diabetes. More often and at a younger age if you are overweight or have a high risk for diabetes. What should I know about preventing infection? Hepatitis B If you have a higher risk for hepatitis B, you should be screened for this virus. Talk with your health care provider to find out if you are at risk for hepatitis B infection. Hepatitis C Testing is recommended for: Everyone  born from [birth year] through 1966. Anyone with known risk factors for hepatitis C. Sexually transmitted infections (STIs) Get screened for STIs, including gonorrhea and chlamydia, if: You are sexually active and are younger than 51 years of age. You are older than 51 years of age and your health care provider tells you that you are at risk for this type of infection. Your  sexual activity has changed since you were last screened, and you are at increased risk for chlamydia or gonorrhea. Ask your health care provider if you are at risk. Ask your health care provider about whether you are at high risk for HIV. Your health care provider may recommend a prescription medicine to help prevent HIV infection. If you choose to take medicine to prevent HIV, you should first get tested for HIV. You should then be tested every 3 months for as long as you are taking the medicine. Pregnancy If you are about to stop having your period (premenopausal) and you may become pregnant, seek counseling before you get pregnant. Take 400 to 800 micrograms (mcg) of folic acid every day if you become pregnant. Ask for birth control (contraception) if you want to prevent pregnancy. Osteoporosis and menopause Osteoporosis is a disease in which the bones lose minerals and strength with aging. This can result in bone fractures. If you are 49 years old or older, or if you are at risk for osteoporosis and fractures, ask your health care provider if you should: Be screened for bone loss. Take a calcium or vitamin D supplement to lower your risk of fractures. Be given hormone replacement therapy (HRT) to treat symptoms of menopause. Follow these instructions at home: Lifestyle Do not use any products that contain nicotine or tobacco, such as cigarettes, e-cigarettes, and chewing tobacco. If you need help quitting, ask your health care provider. Do not use street drugs. Do not share needles. Ask your health care provider for help if you need support or information about quitting drugs. Alcohol use Do not drink alcohol if: Your health care provider tells you not to drink. You are pregnant, may be pregnant, or are planning to become pregnant. If you drink alcohol: Limit how much you use to 0-1 drink a day. Limit intake if you are breastfeeding. Be aware of how much alcohol is in your drink. In the  [place] one drink equals one 12 oz bottle of beer (355 mL), one 5 oz glass of wine (148 mL), or one 1 oz glass of hard liquor (44 mL). General instructions Schedule regular health, dental, and eye exams. Stay current with your vaccines. Tell your health care provider if: You often feel depressed. You have ever been abused or do not feel safe at home. Summary Adopting a healthy lifestyle and getting preventive care are important in promoting health and wellness. Follow your health care provider's instructions about healthy diet, exercising, and getting tested or screened for diseases. Follow your health care provider's instructions on monitoring your cholesterol and blood pressure. This information is not intended to replace advice given to you by your health care provider. Make sure you discuss any questions you have with your health care provider. Document Revised: 02/29/2020 Document Reviewed: 12/14/2017 Elsevier Patient Education  2022 [employer].

## 2020-09-30 NOTE — Progress Notes (Signed)
This visit occurred during the SARS-CoV-2 public health emergency.  Safety protocols were in place, including screening questions prior to the visit, additional usage of staff PPE, and extensive cleaning of exam room while observing appropriate contact time as indicated for disinfecting solutions.    Patient ID: Ruth [name], female  DOB: 01-16-1970, 50 y.o.   MRN: [id number] Patient Care Team    Relationship Specialty Notifications Start End  [name], DO PCP - General Family Medicine  09/25/14   [name], MD Referring Physician Specialist  06/30/15     Chief Complaint  Patient presents with   Annual Exam    Pt is fasting;     Subjective: Ruth [name] is a 50 y.o.  Female  present for CPE . All past medical history, surgical history, allergies, family history, immunizations, medications and social history were updated in the electronic medical record today. All recent labs, ED visits and hospitalizations within the last year were reviewed.  Health maintenance:  Colonoscopy: routine screening at 10 (past due), No Fhx> Gi referral placed today (again) Mammogram: 12/2018 "normal"-->GYN> has one scheduled.  Cervical cancer screening: UTD 2020. Records requested.  Immunizations: UTD tdap (2015), flu shot yearly encouraged- administered today, covid vaccines completed. Shingrix series completed Infectious disease screening: HIV completed, hep c completed DEXA: routine screen. Assistive device: none Oxygen FUX:NATF Patient has a Dental home. Hospitalizations/ED visits: reviewed  Depression screen [facility] 2/9 09/30/2020 03/28/2020 08/01/2019 10/10/2017 11/16/2016  Decreased Interest 0 0 0 0 0  Down, Depressed, Hopeless 0 0 0 0 0  PHQ - 2 Score 0 0 0 0 0   No flowsheet data found.  Immunization History  Administered Date(s) Administered   DTaP 07/05/2010   Influenza,inj,Quad PF,6+ Mos 12/25/2014, 01/05/2017, 01/17/2018, 10/17/2018, 10/29/2019   Moderna  Sars-Covid-2 Vaccination 02/07/2019, 03/14/2019, 12/30/2019, 07/11/2020   PPD Test 05/18/2013   Tdap 10/04/2013   Zoster Recombinat (Shingrix) 08/01/2019, 10/29/2019     Past Medical History:  Diagnosis Date   Allergy    Arthritis    Asthma    GERD (gastroesophageal reflux disease)    History of abdominal pain    2007- mild decreased GB EF; 2008-collapsing R ovarian cyst; 2012 uterine fibroid x 1.   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 She lives alone.     Allergies as of 09/30/2020       Reactions   Other Anaphylaxis   Seafood    Amoxicillin Rash   Shellfish-derived Products Rash, Swelling        Medication List        Accurate as of September 30, 2020 10:34 AM. If you have any questions, ask your nurse or doctor.  budesonide 0.5 MG/2ML nebulizer solution Commonly known as: PULMICORT Mix 2 ml ampule with 8 ounces of water in nettie pot and flush nasal passages qd.   EPINEPHrine 0.3 mg/0.3 mL Soaj injection Commonly known as: EPI-PEN Inject 0.3 mg into the muscle as needed for anaphylaxis.   fluticasone 50 MCG/ACT nasal spray Commonly known as: FLONASE SPRAY 2 SPRAYS INTO EACH NOSTRIL EVERY DAY   lansoprazole 15 MG capsule Commonly known as: PREVACID Take 15 mg by mouth daily as needed.   rosuvastatin 5 MG tablet Commonly known as: Crestor Take 1 tablet (5  mg total) by mouth at bedtime.   VITAMIN D PO Take 1 capsule by mouth daily.        All past medical history, surgical history, allergies, family history, immunizations andmedications were updated in the EMR today and reviewed under the history and medication portions of their EMR.     No results found for this or any previous visit (from the past 2160 hour(s)).  No results found.   ROS: 14 pt review of systems performed and negative (unless mentioned in an HPI)  Objective: BP 131/77   Pulse 62   Temp 98.3 F (36.8 C) (Oral)   Ht 5' 4.25" (1.632 m)   Wt 175 lb (79.4 kg)   LMP 01/23/2018   SpO2 98%   BMI 29.81 kg/m  Gen: Afebrile. No acute distress. Nontoxic in appearance, well-developed, well-nourished, very pleasant female HENT: AT. [place]. Bilateral TM visualized and normal in appearance, normal external auditory canal. MMM, no oral lesions, adequate dentition. Bilateral nares within normal limits-large turbinates. Throat without erythema, ulcerations or exudates.  No cough on exam, no hoarseness on exam. Eyes:Pupils Equal Round Reactive to light, Extraocular movements intact,  Conjunctiva without redness, discharge or icterus. Neck/lymp/endocrine: Supple,no lymphadenopathy, no thyromegaly CV: RRR no murmur, no edema, +2/4 P posterior tibialis pulses.  Chest: CTAB, no wheeze, rhonchi or crackles.  Normal respiratory effort.  Good air movement. Abd: Soft.  Flat. NTND. BS present.  No masses palpated. No hepatosplenomegaly. No rebound tenderness or guarding. Skin: no rashes, purpura or petechiae. Warm and well-perfused. Skin intact. Neuro/Msk: Normal gait. PERLA. EOMi. Alert. Oriented x3.  Cranial nerves II through XII intact. Muscle strength 5/5 upper/lower extremity. DTRs equal bilaterally. Psych: Normal affect, dress and demeanor. Normal speech. Normal thought content and judgment.  No results found.  Assessment/plan: Ruth [name] is a 50 y.o. female present for  CPE Thyromegaly - TSH Hyperlipidemia LDL goal <130/1. Overweight (BMI 25.0-29.9) Has been intolerant to Crestor and Lipitor.  We will try Zetia as next treatment plan if lipids are still elevated today. - CBC with Differential/Platelet - Comprehensive metabolic panel - Hemoglobin A1c - Lipid panel - TSH - T4, free Vitamin D deficiency - VITAMIN D 25 Hydroxy (Vit-D Deficiency, Fractures) Need for influenza vaccination - Flu Vaccine QUAD 6+ mos PF IM (Fluarix Quad PF) Colon cancer screening - Ambulatory referral to Gastroenterology  Chronic maxillary sinusitis/allergic rhinitis Patient has struggled with chronic sinusitis, affecting mostly the right maxillary sinus.  She has seen ENT in the past.  She has had multiple dental appointments due to the pain and they have ruled out any dental complication.  Last appointment we tried Pulmicort flushes with her nasal lavage, she feels this had made the pain even worse.  Nasal lavage alone did not cause pain. - DG Sinuses Complete; Future -Next step is another ENT referral versus CT/MRI of sinus cavity Continue Flonase nasal spray, antihistamine and nasal lavage.  Encounter for general adult medical examination with abnormal findings Colonoscopy: routine screening at 23 (past due), No Fhx> Gi referral placed today (again) Mammogram: 12/2018 "normal"-->GYN> has one scheduled.  Cervical cancer screening: UTD 2020. Records requested.  Immunizations: UTD tdap (2015), flu shot yearly encouraged- administered today, covid vaccines completed. Shingrix series completed Infectious disease screening: HIV completed, hep c completed DEXA: routine screen. Patient was encouraged to exercise greater than 150 minutes a week. Patient was encouraged to choose a diet filled with fresh fruits and vegetables, and lean meats. AVS provided to patient today for education/recommendation on gender specific health and safety maintenance.  Return in about 1 year (around  10/01/2021) for CPE (30 min).   Orders Placed This Encounter  Procedures   Flu Vaccine QUAD 6+ mos PF IM (Fluarix Quad PF)   CBC with Differential/Platelet   Comprehensive metabolic panel   Hemoglobin A1c   Lipid panel   TSH   VITAMIN D 25 Hydroxy (Vit-D Deficiency, Fractures)   T4, free   Ambulatory referral to Gastroenterology    No orders of the defined types were placed in this encounter.  Referral Orders         Ambulatory referral to Gastroenterology       Electronically signed by: Howard Pouch, DO [place]

## 2020-10-06 ENCOUNTER — Telehealth: Payer: Self-pay

## 2020-10-06 DIAGNOSIS — R519 Headache, unspecified: Secondary | ICD-10-CM

## 2020-10-06 DIAGNOSIS — J320 Chronic maxillary sinusitis: Secondary | ICD-10-CM

## 2020-10-06 NOTE — Telephone Encounter (Signed)
LVM for pt to CB regarding results.  

## 2020-10-06 NOTE — Telephone Encounter (Signed)
Spoke with pt regarding labs and instructions.  Pt would like to proceed with further imaging.

## 2020-10-06 NOTE — Telephone Encounter (Signed)
Returning Toria's call from Thursday.  Please call back whenever available.

## 2020-10-07 NOTE — Telephone Encounter (Signed)
LVM to inform pt that order has been placed and will receive a call.

## 2020-10-07 NOTE — Telephone Encounter (Signed)
Sinus CT ordered at [facility]. They will call to get her scheduled after insurance approval.

## 2020-10-08 ENCOUNTER — Encounter: Payer: Self-pay | Admitting: Family Medicine

## 2020-10-08 NOTE — Telephone Encounter (Signed)
Noted  And form completed and returned to [place] work [employer]

## 2020-10-08 NOTE — Telephone Encounter (Signed)
Pt came by and dropped off CPE form as well. Please advise.

## 2020-10-09 ENCOUNTER — Telehealth: Payer: Self-pay

## 2020-10-09 NOTE — Telephone Encounter (Signed)
Returning Toria's call

## 2020-10-09 NOTE — Telephone Encounter (Signed)
LVM for pt to CB regarding immunizations.

## 2020-10-09 NOTE — Telephone Encounter (Signed)
LVM for pt to CB regarding form.

## 2020-10-09 NOTE — Telephone Encounter (Signed)
See other encounter.

## 2020-10-10 ENCOUNTER — Other Ambulatory Visit: Payer: Self-pay

## 2020-10-10 ENCOUNTER — Ambulatory Visit (INDEPENDENT_AMBULATORY_CARE_PROVIDER_SITE_OTHER): Payer: BC Managed Care – PPO

## 2020-10-10 DIAGNOSIS — R519 Headache, unspecified: Secondary | ICD-10-CM

## 2020-10-10 DIAGNOSIS — J320 Chronic maxillary sinusitis: Secondary | ICD-10-CM | POA: Diagnosis not present

## 2020-10-10 MED ORDER — IOHEXOL 350 MG/ML SOLN
100.0000 mL | Freq: Once | INTRAVENOUS | Status: AC | PRN
  Administered 2020-10-10: 63 mL via INTRAVENOUS

## 2020-10-14 NOTE — Telephone Encounter (Signed)
Attached printed and placed on CMA desk

## 2020-10-16 ENCOUNTER — Encounter: Payer: Self-pay | Admitting: Family Medicine

## 2020-10-16 NOTE — Telephone Encounter (Signed)
Records updated that was on desk.

## 2021-01-01 ENCOUNTER — Other Ambulatory Visit: Payer: Self-pay

## 2021-01-01 ENCOUNTER — Ambulatory Visit: Payer: Self-pay | Admitting: Sports Medicine

## 2021-01-08 ENCOUNTER — Other Ambulatory Visit: Payer: Self-pay

## 2021-01-08 ENCOUNTER — Ambulatory Visit (INDEPENDENT_AMBULATORY_CARE_PROVIDER_SITE_OTHER): Payer: BC Managed Care – PPO | Admitting: Sports Medicine

## 2021-01-08 ENCOUNTER — Ambulatory Visit (INDEPENDENT_AMBULATORY_CARE_PROVIDER_SITE_OTHER): Payer: BC Managed Care – PPO

## 2021-01-08 DIAGNOSIS — G5601 Carpal tunnel syndrome, right upper limb: Secondary | ICD-10-CM

## 2021-01-08 NOTE — Progress Notes (Signed)
° ° °  Procedures performed today:    Procedure: Real-time Ultrasound Guided hydrodissection of the right median nerve at the carpal tunnel Device: Samsung HS60 Verbal informed consent obtained.  Time-out conducted.  Noted no overlying erythema, induration, or other signs of local infection.  Skin prepped in a sterile fashion.  Local anesthesia: Topical Ethyl chloride.  With sterile technique and under real time ultrasound guidance: Using a 25-gauge needle advanced into the carpal tunnel, taking care to avoid intraneural injection I injected medication both superficial to and deep to the median nerve freeing it from surrounding structures, I then redirected the needle deep and injected further medication around the flexor tendons deep within the carpal tunnel for a total of 1 cc kenalog 40, 5 cc 1% lidocaine without epinephrine. Completed without difficulty  Advised to call if fevers/chills, erythema, induration, drainage, or persistent bleeding.  Images permanently stored and available for review in PACS.  Impression: Technically successful ultrasound guided median nerve hydrodissection.  Independent interpretation of notes and tests performed by another provider:   None.  Brief History, Exam, Impression, and Recommendations:    Carpal tunnel syndrome, right Repeat median nerve hydrodissection, last done 12/20/2019. Return as needed.    ___________________________________________ Gwen Her. Dianah Field, M.D., ABFM., CAQSM. Primary Care and [place] Instructor of [place] [facility] [employer] [facility]

## 2021-01-08 NOTE — Assessment & Plan Note (Signed)
Repeat median nerve hydrodissection, last done 12/20/2019. Return as needed.

## 2021-01-22 ENCOUNTER — Encounter: Payer: Self-pay | Admitting: Gastroenterology

## 2021-02-26 ENCOUNTER — Ambulatory Visit: Payer: BC Managed Care – PPO

## 2021-02-26 NOTE — Progress Notes (Deleted)
No egg or soy allergy known to patient  No issues known to pt with past sedation with any surgeries or procedures Patient denies ever being told they had issues or difficulty with intubation  No FH of Malignant Hyperthermia Pt is not on diet pills Pt is not on home 02  Pt is not on blood thinners  Pt denies issues with constipation  No A fib or A flutter Pt is fully vaccinated for Covid  Coupon to pt in PV today, Code to Pharmacy and NO PA's for preps discussed with pt in PV today  Discussed with pt there will be an out-of-pocket cost for prep and that varies from $0 to 70 +  dollars - pt verbalized understanding  Due to the COVID-19 pandemic we are asking patients to follow certain guidelines in PV and the [place]   Pt aware of COVID protocols and [facility] guidelines  PV completed over the phone. Pt verified name, DOB, address and insurance during PV today.  Pt mailed instruction packet with copy of consent form to read and not return, and instructions.  Pt encouraged to call with questions or issues.  If pt has My chart, procedure instructions sent via My Chart

## 2021-03-12 ENCOUNTER — Encounter: Payer: BC Managed Care – PPO | Admitting: Gastroenterology

## 2021-05-26 LAB — HM MAMMOGRAPHY

## 2021-05-26 LAB — HM PAP SMEAR

## 2021-07-08 ENCOUNTER — Telehealth: Payer: Self-pay

## 2021-07-08 ENCOUNTER — Ambulatory Visit: Payer: BC Managed Care – PPO | Admitting: Family Medicine

## 2021-07-08 NOTE — Telephone Encounter (Signed)
Pt scheduled for appt  [place] Day - Client TELEPHONE ADVICE RECORD [facility] Patient Name: Ruth [name] Gender: Female DOB: 07-11-1970 Age: 51 Y 11 M 18 D Return Phone Number: [phone] (Primary) Address: City/ State/ Zip: [place]  [address] Client [facility] - Client Client Site [place] - Day Provider Raoul Pitch, [place] Type Call Who Is Calling Patient / Member / Family / Caregiver Call Type Triage / Clinical Relationship To Patient Self Return Phone Number [phone] (Primary) Chief Complaint Sore Throat Reason for Call Symptomatic / Request for Health Information Initial Comment Caller States she been horse on and off the past 2 month to the point where her throat is agitated. Caller States she would like to make an appointment. Translation No Nurse Assessment Nurse: Redmond Pulling, RN, Levada Dy Date/Time Eilene Ghazi Time): 07/06/2021 1:32:08 PM Confirm and document reason for call. If symptomatic, describe symptoms. ---Caller States she been hoarseness on and off the past 2 month to the point. Sinus pressure, earache. No fever. Does the patient have any new or worsening symptoms? ---Yes Will a triage be completed? ---Yes Related visit to physician within the last 2 weeks? ---No Does the PT have any chronic conditions? (i.e. diabetes, asthma, this includes High risk factors for pregnancy, etc.) ---No Is the patient pregnant or possibly pregnant? (Ask all females between the ages of 84-55) ---No Is this a behavioral health or substance abuse call? ---No Guidelines Guideline Title Affirmed Question Affirmed Notes Nurse Date/Time (Eastern Time) Hoarseness [1] MODERATE to SEVERE hoarseness (e.g., interferes with work) AND [2] professional Physicist, medical (e.g., Midwife, singer, Pharmacist, hospital) (Exception: Current common Redmond Pulling, RN, Levada Dy 07/06/2021 1:34:25 PM PLEASE NOTE: All  timestamps contained within this report are represented as [place] Standard Time. CONFIDENTIALTY NOTICE: This fax transmission is intended only for the addressee. It contains information that is legally privileged, confidential or otherwise protected from use or disclosure. If you are not the intended recipient, you are strictly prohibited from reviewing, disclosing, copying using or disseminating any of this information or taking any action in reliance on or regarding this information. If you have received this fax in error, please notify us immediately by telephone so that we can arrange for its return to [place]. Phone: [phone], Toll-Free: [phone], Fax: [phone] Page: 2 of 2 Call Id: [id number] Guidelines Guideline Title Affirmed Question Affirmed Notes Nurse Date/Time Eilene Ghazi Time) cold or mild URI symptoms.) Disp. Time Eilene Ghazi Time) Disposition Final User 07/06/2021 1:37:43 PM SEE PCP WITHIN 3 DAYS Yes Redmond Pulling, RN, Levada Dy Final Disposition 07/06/2021 1:37:43 PM SEE PCP WITHIN 3 DAYS Yes Redmond Pulling, RN, Marin Shutter Disagree/Comply Comply Caller Understands Yes PreDisposition Did not know what to do Care Advice Given Per Guideline SEE PCP WITHIN 3 DAYS: * You need to be seen within 2 or 3 days. * PCP VISIT: Call your doctor (or NP/PA) during regular office hours and make an appointment. A clinic or urgent care center are good places to go for care if your doctor's office is closed or you can't get an appointment. NOTE: If office will be open tomorrow, tell caller to call then, not in 3 days. HUMIDIFIER: DRINK WARM LIQUIDS: * Drink warm soothing liquids. * Hot water with honey is good. Hot tea with sugar and lemon is also good. CALL BACK IF: * Difficulty breathing * Severe difficulty swallowing occurs * You become worse CARE ADVICE given per Hoarseness (Adult) guideline. Comments User: Ivonne Andrew, RN Date/Time Eilene Ghazi Time):  07/06/2021 1:39:31 PM Transferred patient to the office  per client directives to make an appointment. Referrals REFERRED TO PCP OFFICE

## 2021-07-09 ENCOUNTER — Ambulatory Visit: Payer: BC Managed Care – PPO | Admitting: Family Medicine

## 2021-07-21 ENCOUNTER — Telehealth: Payer: Self-pay

## 2021-07-21 NOTE — Telephone Encounter (Signed)
A user error has taken place: encounter opened in error, closed for administrative reasons.

## 2021-07-22 ENCOUNTER — Encounter: Payer: Self-pay | Admitting: Family Medicine

## 2021-07-22 ENCOUNTER — Ambulatory Visit (INDEPENDENT_AMBULATORY_CARE_PROVIDER_SITE_OTHER): Payer: BC Managed Care – PPO | Admitting: Family Medicine

## 2021-07-22 VITALS — BP 116/76 | HR 82 | Temp 97.9°F | Ht 64.25 in | Wt 162.0 lb

## 2021-07-22 DIAGNOSIS — R1012 Left upper quadrant pain: Secondary | ICD-10-CM | POA: Diagnosis not present

## 2021-07-22 DIAGNOSIS — R112 Nausea with vomiting, unspecified: Secondary | ICD-10-CM | POA: Diagnosis not present

## 2021-07-22 DIAGNOSIS — R490 Dysphonia: Secondary | ICD-10-CM | POA: Diagnosis not present

## 2021-07-22 DIAGNOSIS — K219 Gastro-esophageal reflux disease without esophagitis: Secondary | ICD-10-CM | POA: Diagnosis not present

## 2021-07-22 MED ORDER — ONDANSETRON HCL 4 MG/2ML IJ SOLN
4.0000 mg | Freq: Once | INTRAMUSCULAR | Status: AC
  Administered 2021-07-22: 4 mg via INTRAMUSCULAR

## 2021-07-22 MED ORDER — ONDANSETRON HCL 4 MG PO TABS: 4.0000 mg | ORAL_TABLET | Freq: Three times a day (TID) | ORAL | 2 refills | Status: AC | PRN

## 2021-07-22 MED ORDER — PANTOPRAZOLE SODIUM 20 MG PO TBEC: 20.0000 mg | DELAYED_RELEASE_TABLET | Freq: Every day | ORAL | 2 refills | Status: DC

## 2021-07-22 MED ORDER — SUCRALFATE 1 G PO TABS: 1.0000 g | ORAL_TABLET | Freq: Three times a day (TID) | ORAL | 0 refills | Status: DC

## 2021-07-22 NOTE — Patient Instructions (Signed)
Food Choices for Gastroesophageal Reflux Disease, Adult When you have gastroesophageal reflux disease (GERD), the foods you eat and your eating habits are very important. Choosing the right foods can help ease your discomfort. Think about working with a food expert (dietitian) to help you make good choices. What are tips for following this plan? Reading food labels Look for foods that are low in saturated fat. Foods that may help with your symptoms include: Foods that have less than 5% of daily value (DV) of fat. Foods that have 0 grams of trans fat. Cooking Do not fry your food. Cook your food by baking, steaming, grilling, or broiling. These are all methods that do not need a lot of fat for cooking. To add flavor, try to use herbs that are low in spice and acidity. Meal planning  Choose healthy foods that are low in fat, such as: Fruits and vegetables. Whole grains. Low-fat dairy products. Lean meats, fish, and poultry. Eat small meals often instead of eating 3 large meals each day. Eat your meals slowly in a place where you are relaxed. Avoid bending over or lying down until 2-3 hours after eating. Limit high-fat foods such as fatty meats or fried foods. Limit your intake of fatty foods, such as oils, butter, and shortening. Avoid the following as told by your doctor: Foods that cause symptoms. These may be different for different people. Keep a food diary to keep track of foods that cause symptoms. Alcohol. Drinking a lot of liquid with meals. Eating meals during the 2-3 hours before bed. Lifestyle Stay at a healthy weight. Ask your doctor what weight is healthy for you. If you need to lose weight, work with your doctor to do so safely. Exercise for at least 30 minutes on 5 or more days each week, or as told by your doctor. Wear loose-fitting clothes. Do not smoke or use any products that contain nicotine or tobacco. If you need help quitting, ask your doctor. Sleep with the head  of your bed higher than your feet. Use a wedge under the mattress or blocks under the bed frame to raise the head of the bed. Chew sugar-free gum after meals. What foods should eat?  Eat a healthy, well-balanced diet of fruits, vegetables, whole grains, low-fat dairy products, lean meats, fish, and poultry. Each person is different. Foods that may cause symptoms in one person may not cause any symptoms in another person. Work with your doctor to find foods that are safe for you. The items listed above may not be a complete list of what you can eat and drink. Contact a food expert for more options. What foods should I avoid? Limiting some of these foods may help in managing the symptoms of GERD. Everyone is different. Talk with a food expert or your doctor to help you find the exact foods to avoid, if any. Fruits Any fruits prepared with added fat. Any fruits that cause symptoms. For some people, this may include citrus fruits, such as oranges, grapefruit, pineapple, and lemons. Vegetables Deep-fried vegetables. [place] fries. Any vegetables prepared with added fat. Any vegetables that cause symptoms. For some people, this may include tomatoes and tomato products, chili peppers, onions and garlic, and horseradish. Grains Pastries or quick breads with added fat. Meats and other proteins High-fat meats, such as fatty beef or pork, hot dogs, ribs, ham, sausage, salami, and bacon. Fried meat or protein, including fried fish and fried chicken. Nuts and nut butters, in large amounts. Dairy Whole  milk and chocolate milk. Sour cream. Cream. Ice cream. Cream cheese. Milkshakes. Fats and oils Butter. Margarine. Shortening. Ghee. Beverages Coffee and tea, with or without caffeine. Carbonated beverages. Sodas. Energy drinks. Fruit juice made with acidic fruits, such as orange or grapefruit. Tomato juice. Alcoholic drinks. Sweets and desserts Chocolate and cocoa. Donuts. Seasonings and condiments Pepper.  Peppermint and spearmint. Added salt. Any condiments, herbs, or seasonings that cause symptoms. For some people, this may include curry, hot sauce, or vinegar-based salad dressings. The items listed above may not be a complete list of what you should not eat and drink. Contact a food expert for more options. Questions to ask your doctor Diet and lifestyle changes are often the first steps that are taken to manage symptoms of GERD. If diet and lifestyle changes do not help, talk with your doctor about taking medicines. Where to find more information International Foundation for Gastrointestinal Disorders: aboutgerd.org Summary When you have GERD, food and lifestyle choices are very important in easing your symptoms. Eat small meals often instead of 3 large meals a day. Eat your meals slowly and in a place where you are relaxed. Avoid bending over or lying down until 2-3 hours after eating. Limit high-fat foods such as fatty meats or fried foods. This information is not intended to replace advice given to you by your health care provider. Make sure you discuss any questions you have with your health care provider. Document Revised: 07/02/2019 Document Reviewed: 07/02/2019 Elsevier Patient Education  [place].   Peptic Ulcer  A peptic ulcer is a painful sore in the lining of your stomach or the first part of your small intestine. What are the causes? Common causes of this condition include: An infection. Using certain pain medicines too often or too much. Rare tumors in the stomach, small intestine, or pancreas. What increases the risk? You are more likely to get this condition if you: Smoke. Have a family history of ulcer disease. Drink alcohol. Have been hospitalized in an intensive care unit (ICU). What are the signs or symptoms? Symptoms include: Burning pain in the area between the chest and the belly button. The pain may: Not go away (be persistent). Be worse when your  stomach is empty. Be worse at night. Heartburn. Feeling sick to your stomach (nauseous) and throwing up (vomiting). Bloating. If the ulcer results in bleeding, it can cause you to: Have poop (stool) that is black and looks like tar. Throw up bright red blood. Throw up material that looks like coffee grounds. How is this treated? Treatment for this condition may include: Stopping things that can cause the ulcer, such as: Smoking. Using pain medicines. Drinking alcohol or caffeine. Medicines to reduce stomach acid. Antibiotic medicines if the ulcer is caused by an infection. A procedure that is done using a small, flexible tube that has a camera at the end (upper endoscopy). This may be done if you have a bleeding ulcer. Surgery. This may be needed if: You have a lot of bleeding. The ulcer caused a hole somewhere in the digestive system. Follow these instructions at home: Do not drink alcohol if your doctor tells you not to drink. Limit how much caffeine you take in. Do not smoke or use any products that contain nicotine or tobacco. If you need help quitting, ask your doctor. Take over-the-counter and prescription medicines only as told by your doctor. Do not stop or change your medicines unless you talk with your doctor about it first. Do  not take aspirin, ibuprofen, or other NSAIDs unless your doctor told you to do so. Keep all follow-up visits. Contact a doctor if: You do not get better in 7 days after you start treatment. You keep having an upset stomach (indigestion) or heartburn. Get help right away if: You have sudden, sharp pain in your belly (abdomen). You have belly pain that does not go away. You have bloody poop (stool) or black, tarry poop. You throw up blood. It may look like coffee grounds. You feel light-headed or feel like you may pass out (faint). You get weak. You get sweaty or feel sticky and cold to the touch (clammy). These symptoms may be an emergency. Get  help right away. Call 911. Do not wait to see if the symptoms will go away. Do not drive yourself to the hospital. Summary Symptoms of a peptic ulcer include burning pain in the area between the chest and the belly button. Do not smoke or use any products that contain nicotine or tobacco. If you need help quitting, ask your doctor. Take medicines only as told by your doctor. Limit how much alcohol and caffeine you have. Keep all follow-up visits. This information is not intended to replace advice given to you by your health care provider. Make sure you discuss any questions you have with your health care provider. Document Revised: 08/01/2020 Document Reviewed: 08/01/2020 Elsevier Patient Education  [place].

## 2021-07-22 NOTE — Progress Notes (Signed)
Ruth [name] , Mar 27, 1970, 51 y.o., female MRN: [id number] Patient Care Team    Relationship Specialty Notifications Start End  [name], DO PCP - General Family Medicine  09/25/14   [name], MD Referring Physician Specialist  06/30/15     Chief Complaint  Patient presents with   Hoarse    Pt c/o hoarseness, cough, diarrhea and n/v x 4 mos;      Subjective: Pt presents for an OV with complaints of hoarseness of couple months duration.  She states over the last couple months she has had hoarseness almost all the time.  She does report her new job has her talking at the top of her voice, which could be because she is draining.  She denies any sore throat or allergy symptoms.  She did take a Prevacid and felt that it might have helped.  She feels Advil made at worst.  She is experiencing nausea and vomit on occasions after meal.  She did for having mild upper quadrant pain on the left side with her nausea and vomit.  She does not drink alcohol.  Her triglycerides have been normal.  No history of gallstones.      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 She lives alone.    Past Medical History:  Diagnosis Date   Allergy    Arthritis    Asthma    GERD (gastroesophageal reflux disease)    History of abdominal pain    2007- mild decreased GB EF; 2008-collapsing R ovarian cyst; 2012 uterine fibroid x 1.   HV (hallux valgus)    repaired as child    Hyperlipidemia    Prediabetes    Past Surgical History:  Procedure Laterality Date   BUNIONECTOMY Right    performed as child   US TRANSVAGINAL COMPLETE  09/17/2019   Family History  Problem Relation Age of Onset   Hypertension Mother    Alcohol abuse Father    Glaucoma Maternal Grandmother    Asthma Maternal Grandfather    Cancer Paternal Grandfather        lung   Allergies as of 07/22/2021       Reactions   Other Anaphylaxis   Seafood    Crestor [rosuvastatin]    DUB   Lipitor [atorvastatin] Nausea Only   Amoxicillin Rash   Shellfish-derived Products Rash, Swelling        Medication List        Accurate as of July 22, 2021 11:59 PM. If you have any questions, ask your nurse or doctor.          EPINEPHrine 0.3 mg/0.3 mL Soaj injection Commonly known as: EPI-PEN Inject 0.3 mg into the muscle as needed for anaphylaxis.  ezetimibe 10 MG tablet Commonly known as: Zetia Take 1 tablet (10 mg total) by mouth daily.   fluticasone 50 MCG/ACT nasal spray Commonly known as: FLONASE SPRAY 2 SPRAYS INTO EACH NOSTRIL EVERY DAY   lansoprazole 15 MG capsule Commonly known as: PREVACID Take 15 mg by mouth daily as needed.   ondansetron 4 MG tablet Commonly known as: ZOFRAN Take 1 tablet (4 mg total) by mouth every 8 (eight) hours as needed for nausea or vomiting. Started by: Howard Pouch, DO   pantoprazole 20 MG tablet Commonly known as: Protonix Take 1 tablet (20 mg total) by mouth daily. Started by: Howard Pouch, DO   sucralfate 1 g tablet Commonly known as: Carafate Take 1 tablet (1 g total) by mouth 4 (four) times daily -  with meals and at bedtime. Started by: Howard Pouch, DO   VITAMIN D PO Take 1 capsule by mouth daily.        All past medical history, surgical history, allergies, family history, immunizations andmedications were updated in the EMR today and reviewed under the history and medication portions of their EMR.     ROS Negative,  with the exception of above mentioned in HPI   Objective:  BP 116/76   Pulse 82   Temp 97.9 F (36.6 C) (Oral)   Ht 5' 4.25" (1.632 m)   Wt 162 lb (73.5 kg)   LMP 01/23/2018   SpO2 98%   BMI 27.59 kg/m  Body mass index is 27.59 kg/m. Physical Exam Vitals and nursing note reviewed.  Constitutional:      General: She is not in acute distress.    Appearance: Normal appearance. She is not ill-appearing, toxic-appearing or diaphoretic.  HENT:     Head: Normocephalic and atraumatic.     Right Ear: Tympanic membrane and ear canal normal. There is no impacted cerumen.     Left Ear: Tympanic membrane and ear canal normal. There is no impacted cerumen.     Nose: No congestion or rhinorrhea.     Mouth/Throat:     Pharynx: No oropharyngeal exudate or posterior oropharyngeal erythema.  Eyes:     General: No scleral icterus.       Right eye: No discharge.        Left eye: No discharge.     Extraocular Movements: Extraocular movements intact.     Conjunctiva/sclera: Conjunctivae normal.     Pupils: Pupils are equal, round, and reactive to light.  Cardiovascular:     Rate and Rhythm: Normal rate and regular rhythm.  Pulmonary:     Effort: Pulmonary effort is normal. No respiratory distress.     Breath sounds: Normal breath sounds. No wheezing, rhonchi or rales.  Abdominal:     General: Abdomen is flat. There is no distension.     Palpations: Abdomen is soft.     Tenderness: There is no abdominal tenderness. There is no guarding or rebound.  Musculoskeletal:     Cervical back: Neck supple. No tenderness.  Lymphadenopathy:     Cervical: No cervical adenopathy.  Skin:    General: Skin is warm and dry.     Coloration: Skin is not jaundiced or pale.     Findings: No erythema or rash.  Neurological:     Mental Status: She is alert and oriented to person, place, and time. Mental status is at baseline.     Motor: No weakness.     Gait: Gait normal.  Psychiatric:  Mood and  Affect: Mood normal.        Behavior: Behavior normal.        Thought Content: Thought content normal.        Judgment: Judgment normal.      No results found. No results found. Results for orders placed or performed in visit on 07/22/21 (from the past 24 hour(s))  Lipase     Status: None   Collection Time: 07/22/21  4:45 PM  Result Value Ref Range   Lipase 11 7 - 60 U/L  Comp Met (CMET)     Status: None   Collection Time: 07/22/21  4:45 PM  Result Value Ref Range   Glucose, Bld 91 65 - 99 mg/dL   BUN 22 7 - 25 mg/dL   Creat 1.03 0.50 - 1.03 mg/dL   BUN/Creatinine Ratio NOT APPLICABLE 6 - 22 (calc)   Sodium 142 135 - 146 mmol/L   Potassium 3.9 3.5 - 5.3 mmol/L   Chloride 106 98 - 110 mmol/L   CO2 22 20 - 32 mmol/L   Calcium 10.0 8.6 - 10.4 mg/dL   Total Protein 7.5 6.1 - 8.1 g/dL   Albumin 4.7 3.6 - 5.1 g/dL   Globulin 2.8 1.9 - 3.7 g/dL (calc)   AG Ratio 1.7 1.0 - 2.5 (calc)   Total Bilirubin 0.4 0.2 - 1.2 mg/dL   Alkaline phosphatase (APISO) 82 37 - 153 U/L   AST 14 10 - 35 U/L   ALT 11 6 - 29 U/L  CBC w/Diff     Status: Abnormal   Collection Time: 07/22/21  4:45 PM  Result Value Ref Range   WBC 5.3 3.8 - 10.8 Thousand/uL   RBC 5.00 3.80 - 5.10 Million/uL   Hemoglobin 13.0 11.7 - 15.5 g/dL   HCT 39.4 35.0 - 45.0 %   MCV 78.8 (L) 80.0 - 100.0 fL   MCH 26.0 (L) 27.0 - 33.0 pg   MCHC 33.0 32.0 - 36.0 g/dL   RDW 12.9 11.0 - 15.0 %   Platelets 311 140 - 400 Thousand/uL   MPV 11.5 7.5 - 12.5 fL   Neutro Abs 2,496 1,500 - 7,800 cells/uL   Lymphs Abs 2,056 850 - 3,900 cells/uL   Absolute Monocytes 610 200 - 950 cells/uL   Eosinophils Absolute 90 15 - 500 cells/uL   Basophils Absolute 48 0 - 200 cells/uL   Neutrophils Relative % 47.1 %   Total Lymphocyte 38.8 %   Monocytes Relative 11.5 %   Eosinophils Relative 1.7 %   Basophils Relative 0.9 %    Assessment/Plan: Ruth [name] is a 51 y.o. female present for OV for  Hoarseness of voice/Gastroesophageal  reflux disease without esophagitis Suspect reflux as cause of her hoarseness.  She seems to feel it improved with a couple doses of Prevacid OTC, but still present Follow a reflux diet, which was printed for her today. Start Protonix daily Start Carafate 3 times daily AC nightly Start Zofran 4 mg every 8 hours as needed for nausea and vomiting - CBC w/Diff - H. pylori antibody, IgG - ondansetron (ZOFRAN) injection 4 mg  LUQ pain Discussed pain could be secondary to pancreatitis, especially with nausea and vomit.  Also in the differential with her reflux is peptic ulcer. Avoid all NSAIDs. Starting Protonix and Carafate. - Lipase - Comp Met (CMET) - CBC w/Diff - H. pylori antibody, IgG Follow-up dependent upon results   Reviewed expectations re: course of current medical issues. Discussed self-management  of symptoms. Outlined signs and symptoms indicating need for more acute intervention. Patient verbalized understanding and all questions were answered. Patient received an After-Visit Summary.    Orders Placed This Encounter  Procedures   Lipase   Comp Met (CMET)   CBC w/Diff   H. pylori antibody, IgG   Meds ordered this encounter  Medications   pantoprazole (PROTONIX) 20 MG tablet    Sig: Take 1 tablet (20 mg total) by mouth daily.    Dispense:  30 tablet    Refill:  2   ondansetron (ZOFRAN) 4 MG tablet    Sig: Take 1 tablet (4 mg total) by mouth every 8 (eight) hours as needed for nausea or vomiting.    Dispense:  20 tablet    Refill:  2   sucralfate (CARAFATE) 1 g tablet    Sig: Take 1 tablet (1 g total) by mouth 4 (four) times daily -  with meals and at bedtime.    Dispense:  120 tablet    Refill:  0   ondansetron (ZOFRAN) injection 4 mg   Referral Orders  No referral(s) requested today     Note is dictated utilizing voice recognition software. Although note has been proof read prior to signing, occasional typographical errors still can be missed. If any  questions arise, please do not hesitate to call for verification.   electronically signed by:  Howard Pouch, DO  [place]

## 2021-07-23 LAB — COMPREHENSIVE METABOLIC PANEL
AG Ratio: 1.7 (calc) (ref 1.0–2.5)
ALT: 11 U/L (ref 6–29)
AST: 14 U/L (ref 10–35)
Albumin: 4.7 g/dL (ref 3.6–5.1)
Alkaline phosphatase (APISO): 82 U/L (ref 37–153)
BUN: 22 mg/dL (ref 7–25)
CO2: 22 mmol/L (ref 20–32)
Calcium: 10 mg/dL (ref 8.6–10.4)
Chloride: 106 mmol/L (ref 98–110)
Creat: 1.03 mg/dL (ref 0.50–1.03)
Globulin: 2.8 g/dL (calc) (ref 1.9–3.7)
Glucose, Bld: 91 mg/dL (ref 65–99)
Potassium: 3.9 mmol/L (ref 3.5–5.3)
Sodium: 142 mmol/L (ref 135–146)
Total Bilirubin: 0.4 mg/dL (ref 0.2–1.2)
Total Protein: 7.5 g/dL (ref 6.1–8.1)

## 2021-07-23 LAB — CBC WITH DIFFERENTIAL/PLATELET
Absolute Monocytes: 610 cells/uL (ref 200–950)
Basophils Absolute: 48 cells/uL (ref 0–200)
Basophils Relative: 0.9 %
Eosinophils Absolute: 90 cells/uL (ref 15–500)
Eosinophils Relative: 1.7 %
HCT: 39.4 % (ref 35.0–45.0)
Hemoglobin: 13 g/dL (ref 11.7–15.5)
Lymphs Abs: 2056 cells/uL (ref 850–3900)
MCH: 26 pg — ABNORMAL LOW (ref 27.0–33.0)
MCHC: 33 g/dL (ref 32.0–36.0)
MCV: 78.8 fL — ABNORMAL LOW (ref 80.0–100.0)
MPV: 11.5 fL (ref 7.5–12.5)
Monocytes Relative: 11.5 %
Neutro Abs: 2496 cells/uL (ref 1500–7800)
Neutrophils Relative %: 47.1 %
Platelets: 311 10*3/uL (ref 140–400)
RBC: 5 10*6/uL (ref 3.80–5.10)
RDW: 12.9 % (ref 11.0–15.0)
Total Lymphocyte: 38.8 %
WBC: 5.3 10*3/uL (ref 3.8–10.8)

## 2021-07-23 LAB — LIPASE: Lipase: 11 U/L (ref 7–60)

## 2021-07-23 LAB — H. PYLORI ANTIBODY, IGG: H Pylori IgG: NEGATIVE

## 2021-07-24 ENCOUNTER — Telehealth: Payer: Self-pay | Admitting: Family Medicine

## 2021-07-24 NOTE — Telephone Encounter (Signed)
Her H. pylori test is negative.  This is all good news. Recommend taking the Protonix and Carafate as prescribed and follow-up in 6 weeks.

## 2021-07-24 NOTE — Telephone Encounter (Signed)
LM for pt to return call to discuss.  

## 2021-07-24 NOTE — Telephone Encounter (Signed)
Patient returning call regarding results.  Please try to call before close of business day.

## 2021-07-24 NOTE — Telephone Encounter (Signed)
Spoke with patient regarding results/recommendations.  

## 2021-08-04 ENCOUNTER — Ambulatory Visit: Payer: BC Managed Care – PPO | Admitting: Sports Medicine

## 2021-08-04 ENCOUNTER — Ambulatory Visit (INDEPENDENT_AMBULATORY_CARE_PROVIDER_SITE_OTHER): Payer: BC Managed Care – PPO

## 2021-08-04 DIAGNOSIS — M1812 Unilateral primary osteoarthritis of first carpometacarpal joint, left hand: Secondary | ICD-10-CM

## 2021-08-04 MED ORDER — MELOXICAM 15 MG PO TABS: ORAL_TABLET | ORAL | 3 refills | Status: DC

## 2021-08-04 NOTE — Assessment & Plan Note (Signed)
Pleasant 51 year old female, several weeks of increasing pain left thumb basal joint, worse with gripping, classic symptoms. Adding x-rays, home conditioning, meloxicam, return in 6 weeks, injection if not better.

## 2021-08-04 NOTE — Progress Notes (Signed)
    Procedures performed today:    None.  Independent interpretation of notes and tests performed by another provider:   None.  Brief History, Exam, Impression, and Recommendations:    Primary osteoarthritis of first carpometacarpal joint of one hand, left Pleasant 51 year old female, several weeks of increasing pain left thumb basal joint, worse with gripping, classic symptoms. Adding x-rays, home conditioning, meloxicam, return in 6 weeks, injection if not better.    ____________________________________________ Gwen Her. Dianah Field, M.D., ABFM., CAQSM., AME. Primary Care and Sports Medicine [facility]  Adjunct Professor of [place] [employer] [facility] [employer]  Risk manager

## 2021-08-13 ENCOUNTER — Other Ambulatory Visit: Payer: Self-pay | Admitting: Family Medicine

## 2021-08-21 DIAGNOSIS — Z1211 Encounter for screening for malignant neoplasm of colon: Secondary | ICD-10-CM

## 2021-09-14 ENCOUNTER — Ambulatory Visit (INDEPENDENT_AMBULATORY_CARE_PROVIDER_SITE_OTHER): Payer: BC Managed Care – PPO | Admitting: Family Medicine

## 2021-09-14 VITALS — BP 126/76 | HR 58 | Temp 98.0°F | Ht 64.25 in | Wt 164.0 lb

## 2021-09-14 DIAGNOSIS — R490 Dysphonia: Secondary | ICD-10-CM | POA: Diagnosis not present

## 2021-09-14 DIAGNOSIS — E010 Iodine-deficiency related diffuse (endemic) goiter: Secondary | ICD-10-CM | POA: Diagnosis not present

## 2021-09-14 DIAGNOSIS — E041 Nontoxic single thyroid nodule: Secondary | ICD-10-CM | POA: Diagnosis not present

## 2021-09-14 DIAGNOSIS — H9201 Otalgia, right ear: Secondary | ICD-10-CM

## 2021-09-14 MED ORDER — METHYLPREDNISOLONE ACETATE 80 MG/ML IJ SUSP
80.0000 mg | Freq: Once | INTRAMUSCULAR | Status: AC
  Administered 2021-09-14: 80 mg via INTRAMUSCULAR

## 2021-09-14 NOTE — Progress Notes (Signed)
Ruth [name] , [birth year], 51 y.o., female MRN: [id number] Patient Care Team    Relationship Specialty Notifications Start End  [name], DO PCP - General Family Medicine  09/25/14   [name], MD Referring Physician Specialist  06/30/15     Chief Complaint  Patient presents with   Hoarse    Pt c/o worsening in hoarseness and ear pain x 5 days;      Subjective: Ruth [name]  is a 51 y.o. female present for recurrent hoarseness and throat pain.  This has been an ongoing problem for patient without long-term resolution.  Patient has had allergies, and has had great control over her allergies.  She has recurrent sinus infections, which have been well controlled since starting the nasal lavage.  She has been treated for reflux, which did not seem to make any difference in her hoarseness and discomfort in her throat. Patient reports she notices she is being asked by people to repeat herself more secondary to hoarseness. She states her ear will have a sharp pain and she will feel like there is a pain and itching deep in her throat at the level just below her right mandible. She does have a history of thyroid nodules, last being in 2018. Prior note: Pt presents for an OV with complaints of hoarseness of couple months duration.  She states over the last couple months she has had hoarseness almost all the time.  She does report her new job has her talking at the top of her voice, which could be because she is draining.  She denies any sore throat or allergy symptoms.  She did take a Prevacid and felt that it might have helped.  She feels Advil made at worst.  She is experiencing nausea and vomit on occasions after meal.  She did for having mild upper quadrant pain on the left side with her nausea and vomit.  She does not drink alcohol.  Her triglycerides have been normal.  No history of gallstones.      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 She lives alone.    Past Medical History:  Diagnosis Date   Allergy    Arthritis    Asthma    GERD (gastroesophageal reflux disease)    History of abdominal pain    2007- mild decreased GB EF; 2008-collapsing R ovarian cyst; 2012 uterine fibroid x 1.   HV (hallux valgus)    repaired as child   Hyperlipidemia    Prediabetes    Past Surgical History:  Procedure Laterality Date   BUNIONECTOMY Right    performed as child   US TRANSVAGINAL COMPLETE  09/17/2019   Family History  Problem Relation Age of Onset  Hypertension Mother    Alcohol abuse Father    Glaucoma Maternal Grandmother    Asthma Maternal Grandfather    Cancer Paternal Grandfather        lung   Allergies as of 09/14/2021       Reactions   Other Anaphylaxis   Seafood    Crestor [rosuvastatin]    DUB   Lipitor [atorvastatin] Nausea Only   Amoxicillin Rash   Shellfish-derived Products Rash, Swelling        Medication List        Accurate as of September 14, 2021 12:46 PM. If you have any questions, ask your nurse or doctor.          EPINEPHrine 0.3 mg/0.3 mL Soaj injection Commonly known as: EPI-PEN Inject 0.3 mg into the muscle as needed for anaphylaxis.   ezetimibe 10 MG tablet Commonly known as: Zetia Take 1 tablet (10 mg total) by mouth daily.   fluticasone 50 MCG/ACT nasal spray Commonly known as: FLONASE SPRAY 2 SPRAYS INTO EACH NOSTRIL EVERY DAY   meloxicam 15 MG tablet Commonly known as:  MOBIC One tab PO every 24 hours with a meal for 2 weeks, then once every 24 hours prn pain.   ondansetron 4 MG tablet Commonly known as: ZOFRAN Take 1 tablet (4 mg total) by mouth every 8 (eight) hours as needed for nausea or vomiting.   pantoprazole 20 MG tablet Commonly known as: Protonix Take 1 tablet (20 mg total) by mouth daily.   sucralfate 1 g tablet Commonly known as: Carafate Take 1 tablet (1 g total) by mouth 4 (four) times daily -  with meals and at bedtime.   VITAMIN D PO Take 1 capsule by mouth daily.        All past medical history, surgical history, allergies, family history, immunizations andmedications were updated in the EMR today and reviewed under the history and medication portions of their EMR.     ROS Negative, with the exception of above mentioned in HPI   Objective:  BP 126/76   Pulse (!) 58   Temp 98 F (36.7 C) (Oral)   Ht 5' 4.25" (1.632 m)   Wt 164 lb (74.4 kg)   LMP 01/23/2018   SpO2 98%   BMI 27.93 kg/m  Body mass index is 27.93 kg/m. Physical Exam Vitals and nursing note reviewed.  Constitutional:      General: She is not in acute distress.    Appearance: Normal appearance. She is normal weight. She is not ill-appearing or toxic-appearing.     Comments: Hoarseness present today  HENT:     Right Ear: Ear canal and external ear normal. A middle ear effusion is present. There is no impacted cerumen. Tympanic membrane is not injected, perforated or erythematous.     Left Ear: Tympanic membrane, ear canal and external ear normal. There is no impacted cerumen.     Nose: No nasal tenderness, mucosal edema, congestion or rhinorrhea.     Right Turbinates: Enlarged.     Left Turbinates: Enlarged.     Mouth/Throat:     Lips: No lesions.     Mouth: Mucous membranes are moist.     Pharynx: No pharyngeal swelling, oropharyngeal exudate, posterior oropharyngeal erythema or uvula swelling.     Tonsils: No tonsillar exudate or tonsillar  abscesses. 1+ on the right. 1+ on the left.  Eyes:     General:        Right eye: No discharge.  Left eye: No discharge.     Extraocular Movements: Extraocular movements intact.     Conjunctiva/sclera: Conjunctivae normal.     Pupils: Pupils are equal, round, and reactive to light.  Musculoskeletal:     Cervical back: Neck supple. No tenderness.  Lymphadenopathy:     Cervical: No cervical adenopathy.  Skin:    Findings: No rash.  Neurological:     Mental Status: She is alert and oriented to person, place, and time. Mental status is at baseline.  Psychiatric:        Mood and Affect: Mood normal.        Behavior: Behavior normal.        Thought Content: Thought content normal.        Judgment: Judgment normal.       No results found. No results found. No results found for this or any previous visit (from the past 24 hour(s)).   Assessment/Plan: ZELLIE JENNING is a 51 y.o. female present for OV for  Hoarseness of voice/right ear pain She has a very mild right ear effusion.  She stays on her allergy medicine and her Flonase year-round.  No signs of infection. She has mildly enlarged tonsils, this has been present for few years and has caused infections in the past. Reflux has been well depressed and does not seem to be the issue of her hoarseness and throat symptoms. Patient has had diagnostic sinus and CT maxillofacial completed in the past. Referral to [facility] in [place]  Thyroid nodule/hoarseness: Ultrasound of Thyroid Ordered to ensure not cause of recurrent hoarseness and discomfort.    Reviewed expectations re: course of current medical issues. Discussed self-management of symptoms. Outlined signs and symptoms indicating need for more acute intervention. Patient verbalized understanding and all questions were answered. Patient received an After-Visit Summary.    Orders Placed This Encounter  Procedures   Ambulatory referral to ENT   Meds  ordered this encounter  Medications   methylPREDNISolone acetate (DEPO-MEDROL) injection 80 mg   Referral Orders         Ambulatory referral to ENT       Note is dictated utilizing voice recognition software. Although note has been proof read prior to signing, occasional typographical errors still can be missed. If any questions arise, please do not hesitate to call for verification.   electronically signed by:  Howard Pouch, DO  [place]

## 2021-09-21 ENCOUNTER — Ambulatory Visit (INDEPENDENT_AMBULATORY_CARE_PROVIDER_SITE_OTHER): Payer: BC Managed Care – PPO

## 2021-09-21 DIAGNOSIS — E041 Nontoxic single thyroid nodule: Secondary | ICD-10-CM

## 2021-09-21 DIAGNOSIS — R490 Dysphonia: Secondary | ICD-10-CM | POA: Diagnosis not present

## 2021-10-02 ENCOUNTER — Telehealth: Payer: Self-pay | Admitting: Family Medicine

## 2021-10-02 ENCOUNTER — Ambulatory Visit: Payer: BC Managed Care – PPO | Admitting: Sports Medicine

## 2021-10-02 ENCOUNTER — Ambulatory Visit (INDEPENDENT_AMBULATORY_CARE_PROVIDER_SITE_OTHER): Payer: BC Managed Care – PPO | Admitting: Family Medicine

## 2021-10-02 ENCOUNTER — Encounter: Payer: Self-pay | Admitting: Family Medicine

## 2021-10-02 VITALS — BP 111/73 | HR 59 | Temp 98.0°F | Ht 64.25 in | Wt 161.0 lb

## 2021-10-02 DIAGNOSIS — E010 Iodine-deficiency related diffuse (endemic) goiter: Secondary | ICD-10-CM | POA: Diagnosis not present

## 2021-10-02 DIAGNOSIS — Z0000 Encounter for general adult medical examination without abnormal findings: Secondary | ICD-10-CM | POA: Diagnosis not present

## 2021-10-02 DIAGNOSIS — E785 Hyperlipidemia, unspecified: Secondary | ICD-10-CM

## 2021-10-02 DIAGNOSIS — Z79899 Other long term (current) drug therapy: Secondary | ICD-10-CM | POA: Diagnosis not present

## 2021-10-02 DIAGNOSIS — E559 Vitamin D deficiency, unspecified: Secondary | ICD-10-CM | POA: Diagnosis not present

## 2021-10-02 DIAGNOSIS — N951 Menopausal and female climacteric states: Secondary | ICD-10-CM

## 2021-10-02 DIAGNOSIS — M1812 Unilateral primary osteoarthritis of first carpometacarpal joint, left hand: Secondary | ICD-10-CM

## 2021-10-02 DIAGNOSIS — Z1211 Encounter for screening for malignant neoplasm of colon: Secondary | ICD-10-CM

## 2021-10-02 DIAGNOSIS — Z1231 Encounter for screening mammogram for malignant neoplasm of breast: Secondary | ICD-10-CM

## 2021-10-02 LAB — COMPREHENSIVE METABOLIC PANEL
ALT: 11 U/L (ref 0–35)
AST: 12 U/L (ref 0–37)
Albumin: 4.6 g/dL (ref 3.5–5.2)
Alkaline Phosphatase: 91 U/L (ref 39–117)
BUN: 19 mg/dL (ref 6–23)
CO2: 29 mEq/L (ref 19–32)
Calcium: 9.8 mg/dL (ref 8.4–10.5)
Chloride: 104 mEq/L (ref 96–112)
Creatinine, Ser: 0.95 mg/dL (ref 0.40–1.20)
GFR: 69.03 mL/min (ref >60.00)
Glucose, Bld: 89 mg/dL (ref 70–99)
Potassium: 4.2 mEq/L (ref 3.5–5.1)
Sodium: 141 mEq/L (ref 135–145)
Total Bilirubin: 0.4 mg/dL (ref 0.2–1.2)
Total Protein: 7.2 g/dL (ref 6.0–8.3)

## 2021-10-02 LAB — CBC
HCT: 39.2 % (ref 36.0–46.0)
Hemoglobin: 12.7 g/dL (ref 12.0–15.0)
MCHC: 32.3 g/dL (ref 30.0–36.0)
MCV: 80.9 fl (ref 78.0–100.0)
Platelets: 268 10*3/uL (ref 150.0–400.0)
RBC: 4.84 Mil/uL (ref 3.87–5.11)
RDW: 13.8 % (ref 11.5–15.5)
WBC: 4.3 10*3/uL (ref 4.0–10.5)

## 2021-10-02 LAB — LIPID PANEL
Cholesterol: 231 mg/dL — ABNORMAL HIGH (ref 0–200)
HDL: 61.9 mg/dL (ref >39.00)
LDL Cholesterol: 155 mg/dL — ABNORMAL HIGH (ref 0–99)
NonHDL: 169.44
Total CHOL/HDL Ratio: 4
Triglycerides: 74 mg/dL (ref 0.0–149.0)
VLDL: 14.8 mg/dL (ref 0.0–40.0)

## 2021-10-02 LAB — VITAMIN D 25 HYDROXY (VIT D DEFICIENCY, FRACTURES): VITD: 32.83 ng/mL (ref 30.00–100.00)

## 2021-10-02 LAB — TSH: TSH: 1.3 u[IU]/mL (ref 0.35–5.50)

## 2021-10-02 LAB — HEMOGLOBIN A1C: Hgb A1c MFr Bld: 6.1 % (ref 4.6–6.5)

## 2021-10-02 MED ORDER — EZETIMIBE 10 MG PO TABS: 10.0000 mg | ORAL_TABLET | Freq: Every day | ORAL | 3 refills | Status: DC

## 2021-10-02 MED ORDER — PANTOPRAZOLE SODIUM 20 MG PO TBEC: 20.0000 mg | DELAYED_RELEASE_TABLET | Freq: Every day | ORAL | 3 refills | Status: DC

## 2021-10-02 MED ORDER — MELOXICAM 15 MG PO TABS: ORAL_TABLET | ORAL | 3 refills | Status: DC

## 2021-10-02 MED ORDER — MONTELUKAST SODIUM 10 MG PO TABS: 10.0000 mg | ORAL_TABLET | Freq: Every day | ORAL | 3 refills | Status: DC

## 2021-10-02 MED ORDER — FLUTICASONE PROPIONATE 50 MCG/ACT NA SUSP: NASAL | 11 refills | Status: AC

## 2021-10-02 NOTE — Telephone Encounter (Signed)
Pt needs skin TB screening. Please advise.

## 2021-10-02 NOTE — Patient Instructions (Signed)
No follow-ups on file.        Great to see you today.  I have refilled the medication(s) we provide.   If labs were collected, we will inform you of lab results once received either by echart message or telephone call.   - echart message- for normal results that have been seen by the patient already.   - telephone call: abnormal results or if patient has not viewed results in their echart.  Health Maintenance, Female Adopting a healthy lifestyle and getting preventive care are important in promoting health and wellness. Ask your health care provider about: The right schedule for you to have regular tests and exams. Things you can do on your own to prevent diseases and keep yourself healthy. What should I know about diet, weight, and exercise? Eat a healthy diet  Eat a diet that includes plenty of vegetables, fruits, low-fat dairy products, and lean protein. Do not eat a lot of foods that are high in solid fats, added sugars, or sodium. Maintain a healthy weight Body mass index (BMI) is used to identify weight problems. It estimates body fat based on height and weight. Your health care provider can help determine your BMI and help you achieve or maintain a healthy weight. Get regular exercise Get regular exercise. This is one of the most important things you can do for your health. Most adults should: Exercise for at least 150 minutes each week. The exercise should increase your heart rate and make you sweat (moderate-intensity exercise). Do strengthening exercises at least twice a week. This is in addition to the moderate-intensity exercise. Spend less time sitting. Even light physical activity can be beneficial. Watch cholesterol and blood lipids Have your blood tested for lipids and cholesterol at 52 years of age, then have this test every 5 years. Have your cholesterol levels checked more often if: Your lipid or cholesterol levels are high. You are older than 52 years of  age. You are at high risk for heart disease. What should I know about cancer screening? Depending on your health history and family history, you may need to have cancer screening at various ages. This may include screening for: Breast cancer. Cervical cancer. Colorectal cancer. Skin cancer. Lung cancer. What should I know about heart disease, diabetes, and high blood pressure? Blood pressure and heart disease High blood pressure causes heart disease and increases the risk of stroke. This is more likely to develop in people who have high blood pressure readings or are overweight. Have your blood pressure checked: Every 3-5 years if you are 18-39 years of age. Every year if you are 40 years old or older. Diabetes Have regular diabetes screenings. This checks your fasting blood sugar level. Have the screening done: Once every three years after age 40 if you are at a normal weight and have a low risk for diabetes. More often and at a younger age if you are overweight or have a high risk for diabetes. What should I know about preventing infection? Hepatitis B If you have a higher risk for hepatitis B, you should be screened for this virus. Talk with your health care provider to find out if you are at risk for hepatitis B infection. Hepatitis C Testing is recommended for: Everyone born from 1945 through 1965. Anyone with known risk factors for hepatitis C. Sexually transmitted infections (STIs) Get screened for STIs, including gonorrhea and chlamydia, if: You are sexually active and are younger than 52 years of age. You are   older than 52 years of age and your health care provider tells you that you are at risk for this type of infection. Your sexual activity has changed since you were last screened, and you are at increased risk for chlamydia or gonorrhea. Ask your health care provider if you are at risk. Ask your health care provider about whether you are at high risk for HIV. Your health  care provider may recommend a prescription medicine to help prevent HIV infection. If you choose to take medicine to prevent HIV, you should first get tested for HIV. You should then be tested every 3 months for as long as you are taking the medicine. Pregnancy If you are about to stop having your period (premenopausal) and you may become pregnant, seek counseling before you get pregnant. Take 400 to 800 micrograms (mcg) of folic acid every day if you become pregnant. Ask for birth control (contraception) if you want to prevent pregnancy. Osteoporosis and menopause Osteoporosis is a disease in which the bones lose minerals and strength with aging. This can result in bone fractures. If you are 65 years old or older, or if you are at risk for osteoporosis and fractures, ask your health care provider if you should: Be screened for bone loss. Take a calcium or vitamin D supplement to lower your risk of fractures. Be given hormone replacement therapy (HRT) to treat symptoms of menopause. Follow these instructions at home: Alcohol use Do not drink alcohol if: Your health care provider tells you not to drink. You are pregnant, may be pregnant, or are planning to become pregnant. If you drink alcohol: Limit how much you have to: 0-1 drink a day. Know how much alcohol is in your drink. In the [place] one drink equals one 12 oz bottle of beer (355 mL), one 5 oz glass of wine (148 mL), or one 1 oz glass of hard liquor (44 mL). Lifestyle Do not use any products that contain nicotine or tobacco. These products include cigarettes, chewing tobacco, and vaping devices, such as e-cigarettes. If you need help quitting, ask your health care provider. Do not use street drugs. Do not share needles. Ask your health care provider for help if you need support or information about quitting drugs. General instructions Schedule regular health, dental, and eye exams. Stay current with your vaccines. Tell your health  care provider if: You often feel depressed. You have ever been abused or do not feel safe at home. Summary Adopting a healthy lifestyle and getting preventive care are important in promoting health and wellness. Follow your health care provider's instructions about healthy diet, exercising, and getting tested or screened for diseases. Follow your health care provider's instructions on monitoring your cholesterol and blood pressure. This information is not intended to replace advice given to you by your health care provider. Make sure you discuss any questions you have with your health care provider. Document Revised: 05/12/2020 Document Reviewed: 05/12/2020 Elsevier Patient Education  2023 [employer]  

## 2021-10-02 NOTE — Telephone Encounter (Signed)
LVM for pt to CB to sched appts

## 2021-10-02 NOTE — Telephone Encounter (Signed)
Okay to schedule her for nurse visit for TB skin test.   Make sure she understands she will need to return in 48 to 72 hours for a reading.

## 2021-10-02 NOTE — Progress Notes (Signed)
Patient ID: MISHEEL GOWANS, female  DOB: 1970/07/20, 51 y.o.   MRN: [id number] Patient Care Team    Relationship Specialty Notifications Start End  [name], DO PCP - General Family Medicine  09/25/14   [name], MD Referring Physician Specialist  06/30/15     Chief Complaint  Patient presents with   Annual Exam    Pt is fasting    Subjective:  BAYLEN DEA is a 51 y.o.  Female  present for CPE . All past medical history, surgical history, allergies, family history, immunizations, medications and social history were Updated in the electronic medical record today. All recent labs, ED visits and hospitalizations within the last year were reviewed.  Health maintenance:  Colonoscopy: routine screening at 28 (past due), No Fhx> Gi referral placed today (again) (again) Mammogram:2023"normal"-->GYN> requested Cervical cancer screening: UTD 2020. Records requested.  Immunizations: UTD tdap (2015), flu shot yearly encouraged- completed today, covid vaccines completed. Shingrix series completed Infectious disease screening: HIV completed, hep c completed DEXA: routine screen. Assistive device: None Oxygen use: None Patient has a Dental home. Hospitalizations/ED visits: None none      07/22/2021    4:14 PM 09/30/2020   10:11 AM 03/28/2020    2:01 PM 08/01/2019    2:04 PM 10/10/2017    4:39 PM  Depression screen PHQ 2/9  Decreased Interest 0 0 0 0 0  Down, Depressed, Hopeless 0 0 0 0 0  PHQ - 2 Score 0 0 0 0 0       No data to display          Immunization History  Administered Date(s) Administered   DTP 09/20/1969, 11/29/1969, 04/22/1970, 01/22/1971   DTaP 07/05/2010   Influenza,inj,Quad PF,6+ Mos 12/25/2014, 01/05/2017, 01/17/2018, 10/17/2018, 10/29/2019, 09/30/2020   Influenza-Unspecified 09/22/2021   MMR 07/18/1970   Moderna Covid-19 Vaccine Bivalent Booster 80yr & up 09/22/2021   Moderna Sars-Covid-2 Vaccination 02/07/2019, 03/14/2019,  12/30/2019, 07/11/2020   OPV 09/20/1969, 11/29/1969, 04/18/1970, 01/22/1971   PPD Test 05/18/2013   Tdap 10/04/2013   Zoster Recombinat (Shingrix) 08/01/2019, 10/29/2019     Past Medical History:  Diagnosis Date   Allergy    Arthritis    Asthma    GERD (gastroesophageal reflux disease)    History of abdominal pain    2007- mild decreased GB EF; 2008-collapsing R ovarian cyst; 2012 uterine fibroid x 1.   HV (hallux valgus)    repaired as child   Hyperlipidemia    Prediabetes    Allergies  Allergen Reactions   Other Anaphylaxis    Seafood     Crestor [Rosuvastatin]     DUB   Lipitor [Atorvastatin] Nausea Only   Amoxicillin Rash   Shellfish-Derived Products Rash and Swelling   Past Surgical History:  Procedure Laterality Date   BUNIONECTOMY Right    performed as child   [place] COMPLETE  09/17/2019   Family History  Problem Relation Age of Onset   Hypertension Mother    Alcohol abuse Father    Glaucoma Maternal Grandmother    Asthma Maternal Grandfather    Cancer Paternal Grandfather        lung   Social History   Social History Narrative   ** Merged History Encounter **       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     Allergies as of 10/02/2021       Reactions   Other Anaphylaxis   Seafood  Crestor [rosuvastatin]    DUB   Lipitor [atorvastatin] Nausea Only   Amoxicillin Rash   Shellfish-derived Products Rash, Swelling        Medication List        Accurate as of October 02, 2021  9:14 AM. If you have any questions, ask your nurse or doctor.          STOP taking these medications    sucralfate 1 g tablet Commonly known as: Carafate Stopped by: Howard Pouch, DO       TAKE these medications    EPINEPHrine 0.3 mg/0.3 mL Soaj injection Commonly known as: EPI-PEN Inject 0.3 mg into the muscle as needed for anaphylaxis.   ezetimibe 10 MG tablet Commonly known as: Zetia Take 1 tablet (10 mg total) by mouth  daily.   fluticasone 50 MCG/ACT nasal spray Commonly known as: FLONASE SPRAY 2 SPRAYS INTO EACH NOSTRIL EVERY DAY   meloxicam 15 MG tablet Commonly known as: MOBIC One tab PO every 24 hours with a meal for 2 weeks, then once every 24 hours prn pain.   montelukast 10 MG tablet Commonly known as: SINGULAIR Take 1 tablet (10 mg total) by mouth at bedtime. Started by: Howard Pouch, DO   ondansetron 4 MG tablet Commonly known as: ZOFRAN Take 1 tablet (4 mg total) by mouth every 8 (eight) hours as needed for nausea or vomiting.   pantoprazole 20 MG tablet Commonly known as: Protonix Take 1 tablet (20 mg total) by mouth daily.   VITAMIN D PO Take 1 capsule by mouth daily.        All past medical history, surgical history, allergies, family history, immunizations andmedications were updated in the EMR today and reviewed under the history and medication portions of their EMR.      No results found.   ROS 14 pt review of systems performed and negative (unless mentioned in an HPI)  Objective: BP 111/73   Pulse (!) 59   Temp 98 F (36.7 C) (Oral)   Ht 5' 4.25" (1.632 m)   Wt 161 lb (73 kg)   LMP 01/23/2018   SpO2 99%   BMI 27.42 kg/m  Physical Exam Vitals and nursing note reviewed.  Constitutional:      General: She is not in acute distress.    Appearance: Normal appearance. She is not ill-appearing or toxic-appearing.  HENT:     Head: Normocephalic and atraumatic.     Right Ear: Tympanic membrane, ear canal and external ear normal. There is no impacted cerumen.     Left Ear: Tympanic membrane, ear canal and external ear normal. There is no impacted cerumen.     Nose: No congestion or rhinorrhea.     Mouth/Throat:     Mouth: Mucous membranes are moist.     Pharynx: Oropharynx is clear. No oropharyngeal exudate or posterior oropharyngeal erythema.     Tonsils: No tonsillar exudate or tonsillar abscesses. 2+ on the right. 2+ on the left.  Eyes:     General: No  scleral icterus.       Right eye: No discharge.        Left eye: No discharge.     Extraocular Movements: Extraocular movements intact.     Conjunctiva/sclera: Conjunctivae normal.     Pupils: Pupils are equal, round, and reactive to light.  Cardiovascular:     Rate and Rhythm: Normal rate and regular rhythm.     Pulses: Normal pulses.     Heart sounds: Normal  heart sounds. No murmur heard.    No friction rub. No gallop.  Pulmonary:     Effort: Pulmonary effort is normal. No respiratory distress.     Breath sounds: Normal breath sounds. No stridor. No wheezing, rhonchi or rales.  Chest:     Chest wall: No tenderness.  Abdominal:     General: Abdomen is flat. Bowel sounds are normal. There is no distension.     Palpations: Abdomen is soft. There is no mass.     Tenderness: There is no abdominal tenderness. There is no right CVA tenderness, left CVA tenderness, guarding or rebound.     Hernia: No hernia is present.  Musculoskeletal:        General: No swelling, tenderness or deformity. Normal range of motion.     Cervical back: Normal range of motion and neck supple. No rigidity or tenderness.     Right lower leg: No edema.     Left lower leg: No edema.  Lymphadenopathy:     Cervical: No cervical adenopathy.  Skin:    General: Skin is warm and dry.     Coloration: Skin is not jaundiced or pale.     Findings: No bruising, erythema, lesion or rash.  Neurological:     General: No focal deficit present.     Mental Status: She is alert and oriented to person, place, and time. Mental status is at baseline.     Cranial Nerves: No cranial nerve deficit.     Sensory: No sensory deficit.     Motor: No weakness.     Coordination: Coordination normal.     Gait: Gait normal.     Deep Tendon Reflexes: Reflexes normal.  Psychiatric:        Mood and Affect: Mood normal.        Behavior: Behavior normal.        Thought Content: Thought content normal.        Judgment: Judgment normal.       No results found.  Assessment/plan: SHERRE WOOTON is a 51 y.o. 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  Dispense: 30 tablet; Refill: 3  Routine general medical examination at a health care facility Colonoscopy: routine screening at 38 (past due), No Fhx> Gi referral placed today (again) (again) Mammogram:2023"normal"-->GYN> requested Cervical cancer screening: UTD 2020. Records requested.  Immunizations: UTD tdap (2015), flu shot yearly encouraged- completed today, covid vaccines completed. Shingrix series completed Infectious disease screening: HIV completed, hep c completed DEXA: routine screen. Patient was encouraged to exercise greater than 150 minutes a week. Patient was encouraged to choose a diet filled with fresh fruits and vegetables, and lean meats. AVS provided to patient today for education/recommendation on gender specific health and safety maintenance.  Return in about 1 year (around 10/04/2022) for cpe (20 min).   Orders Placed This Encounter  Procedures   CBC   Comprehensive metabolic panel   Hemoglobin A1c   Lipid panel   TSH   VITAMIN D 25 Hydroxy (Vit-D  Deficiency, Fractures)   Ambulatory referral to Gastroenterology   Meds ordered this encounter  Medications   ezetimibe (ZETIA) 10 MG tablet    Sig: Take 1 tablet (10 mg total) by mouth daily.    Dispense:  90 tablet    Refill:  3    Dc crestor and liptor   meloxicam (MOBIC) 15 MG tablet     Sig: One tab PO every 24 hours with a meal for 2 weeks, then once every 24 hours prn pain.    Dispense:  30 tablet    Refill:  3   pantoprazole (PROTONIX) 20 MG tablet    Sig: Take 1 tablet (20 mg total) by mouth daily.    Dispense:  90 tablet    Refill:  3   fluticasone (FLONASE) 50 MCG/ACT nasal spray    Sig: SPRAY 2 SPRAYS INTO EACH NOSTRIL EVERY DAY    Dispense:  9.9 mL    Refill:  11   montelukast (SINGULAIR) 10 MG tablet    Sig: Take 1 tablet (10 mg total) by mouth at bedtime.    Dispense:  90 tablet    Refill:  3   Referral Orders         Ambulatory referral to Gastroenterology      Electronically signed by: Howard Pouch, DO [place]

## 2021-10-02 NOTE — Telephone Encounter (Signed)
Pt would like a call back regarding TB screening.

## 2021-10-05 ENCOUNTER — Telehealth: Payer: Self-pay | Admitting: Family Medicine

## 2021-10-05 NOTE — Telephone Encounter (Signed)
Patient returning call about lab results.  Please call patient back when available.  

## 2021-10-05 NOTE — Telephone Encounter (Signed)
Please call patient Liver, kidney and thyroid function are normal Blood cell counts and electrolytes are normal Diabetes screening/A1c is likely elevated at 6.1 but fasting glucose is normal.  Which is reassuring.  No medications needed at this time. Cholesterol panel looks is still mildly above goal with an LDL of 155.  Please make sure patient is taking her Zetia daily. Vitamin D levels are in normal range, continue vitamin D supplement.

## 2021-10-05 NOTE — Telephone Encounter (Signed)
Spoke with patient regarding results/recommendations.  

## 2021-10-05 NOTE — Telephone Encounter (Signed)
LM for pt to return call to discuss.  

## 2021-10-09 NOTE — Telephone Encounter (Signed)
Pt has scheduled for TB placement.

## 2021-10-12 ENCOUNTER — Ambulatory Visit (INDEPENDENT_AMBULATORY_CARE_PROVIDER_SITE_OTHER): Payer: BC Managed Care – PPO

## 2021-10-12 DIAGNOSIS — Z111 Encounter for screening for respiratory tuberculosis: Secondary | ICD-10-CM

## 2021-10-14 LAB — TB SKIN TEST
Induration: 0 mm
TB Skin Test: NEGATIVE

## 2021-12-03 ENCOUNTER — Encounter: Payer: Self-pay | Admitting: Gastroenterology

## 2021-12-20 IMAGING — DX DG SINUSES COMPLETE 3+V
3 series · 3 of 3 positions shown · non-contrast
Comparison: None.

CLINICAL DATA: Sinus pain

EXAM:
PARANASAL SINUSES - COMPLETE 3 + VIEW

[pns waters]
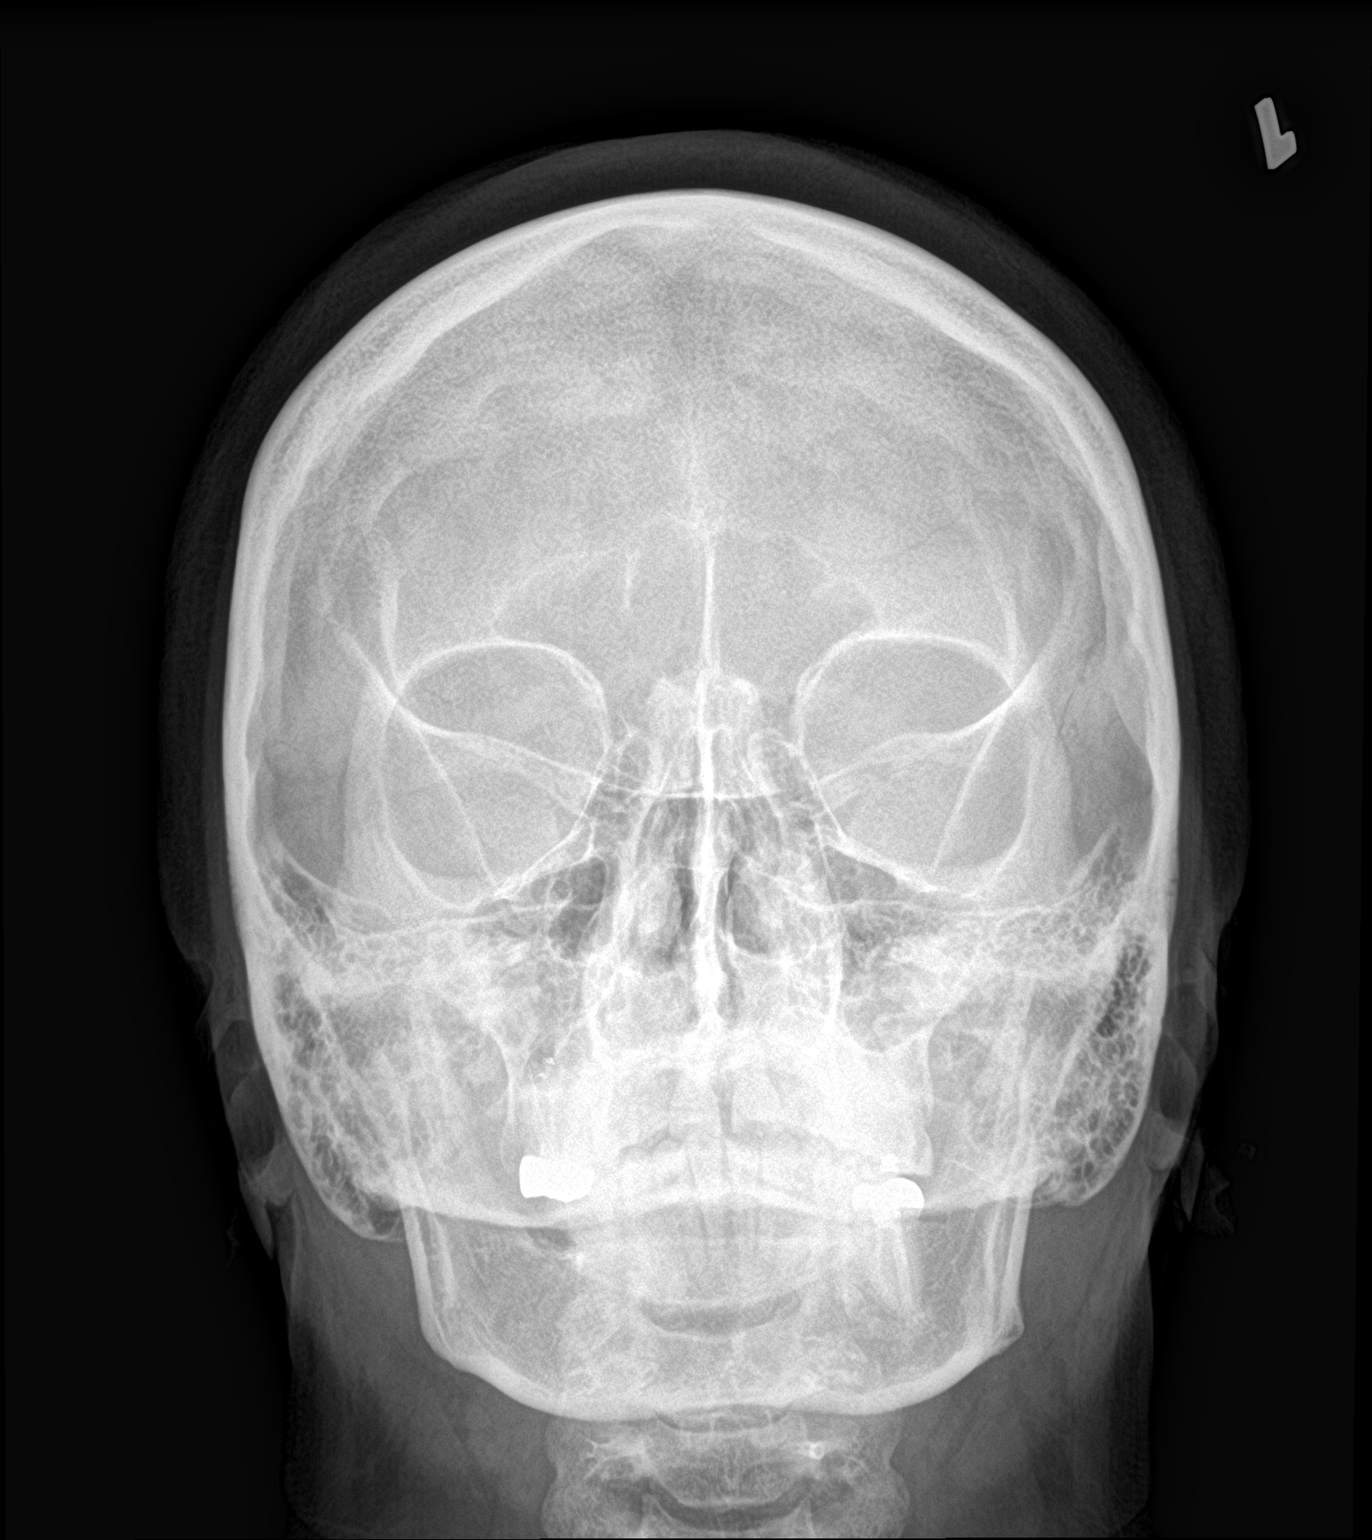

[[person_name]]
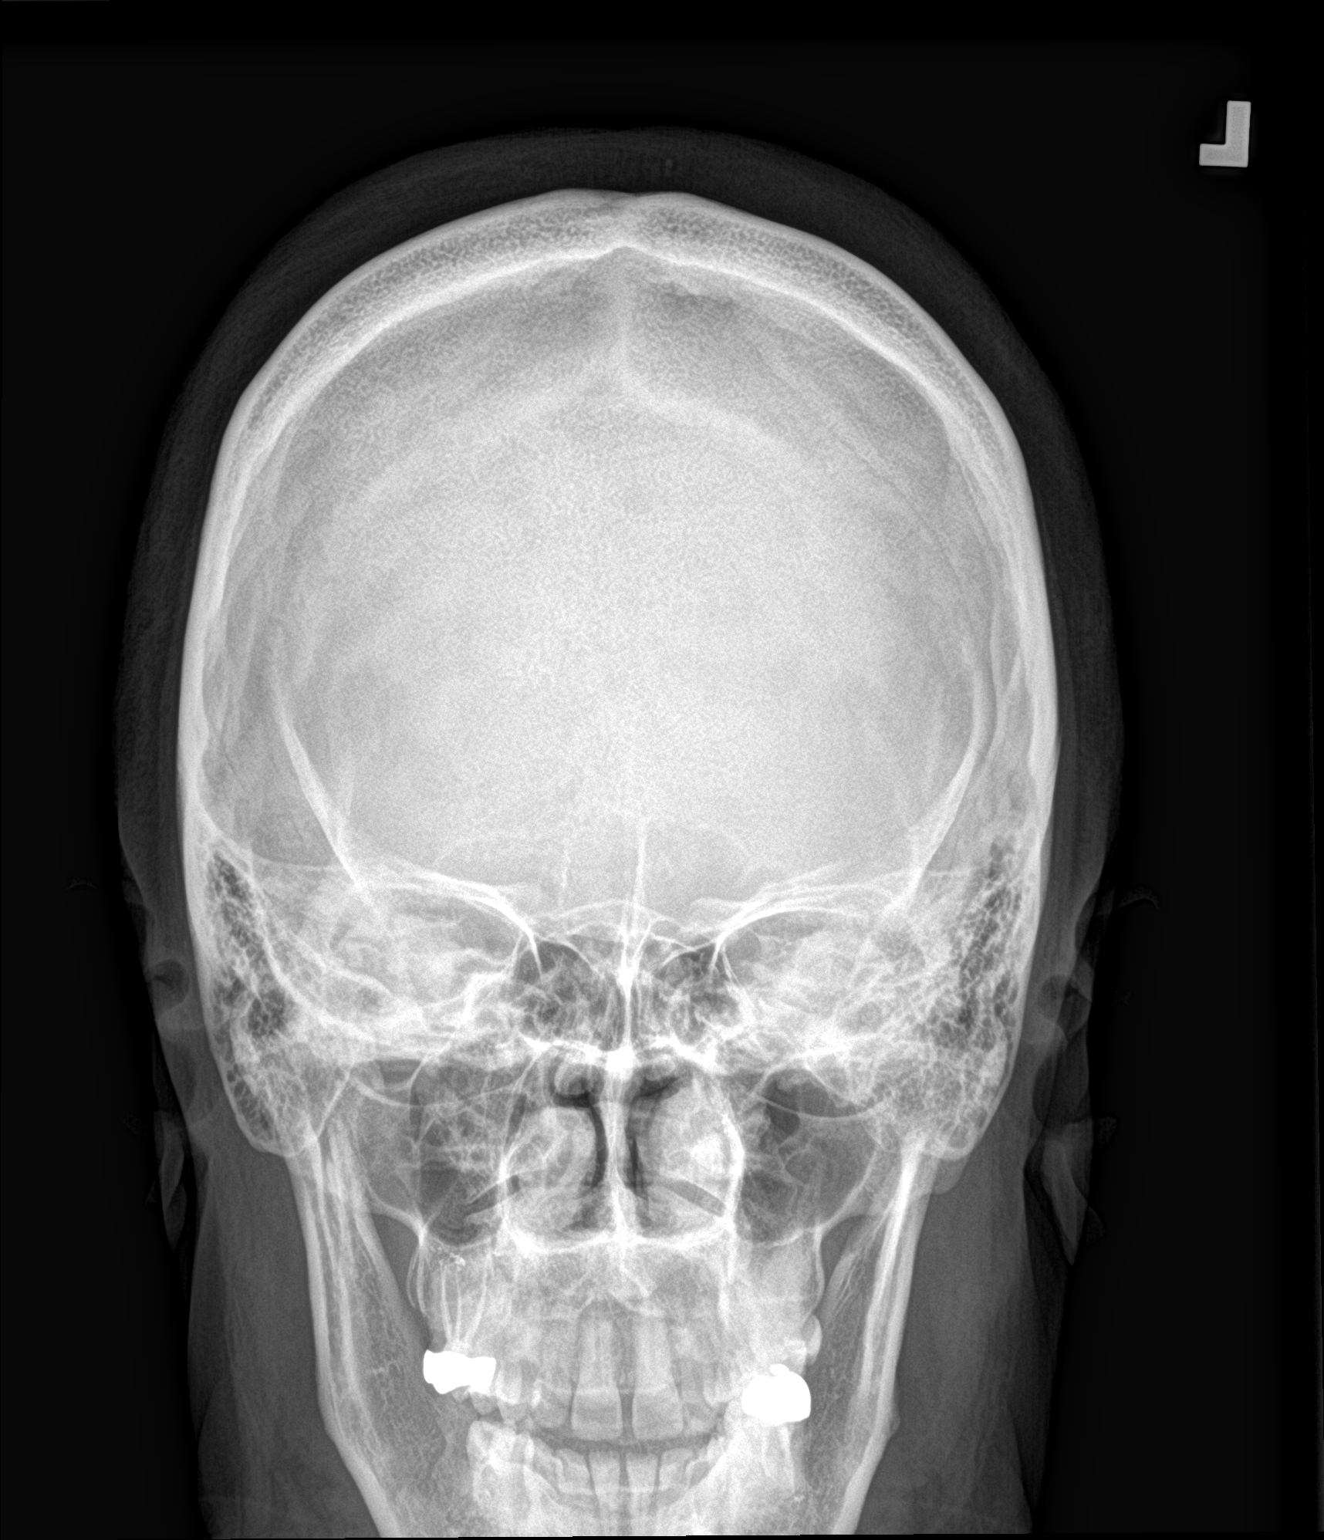

[pns lat]
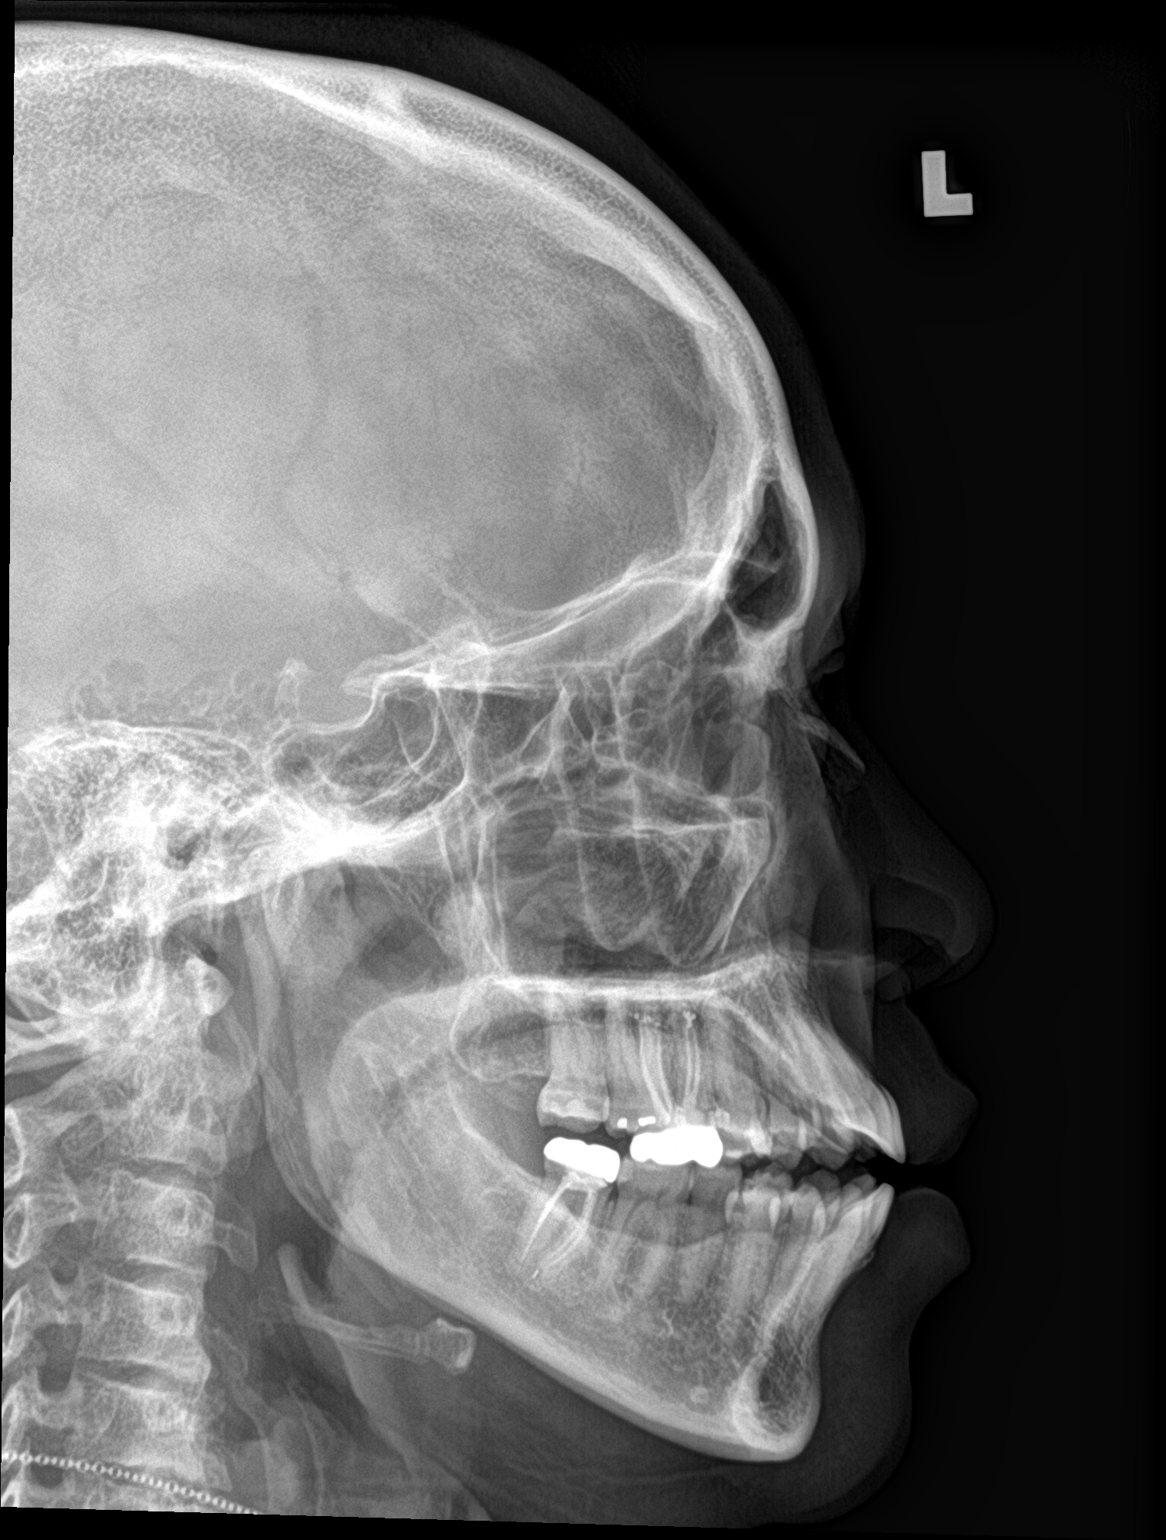

[3 of 3 positions shown; findings below may reference images not displayed]

FINDINGS: The paranasal sinus are aerated. There is no evidence of sinus
opacification air-fluid levels or mucosal thickening. No significant
bone abnormalities are seen.
IMPRESSION: Negative.

## 2021-12-23 ENCOUNTER — Ambulatory Visit (AMBULATORY_SURGERY_CENTER): Payer: BC Managed Care – PPO

## 2021-12-23 VITALS — Ht 64.0 in | Wt 161.0 lb

## 2021-12-23 DIAGNOSIS — Z1211 Encounter for screening for malignant neoplasm of colon: Secondary | ICD-10-CM

## 2021-12-23 MED ORDER — NA SULFATE-K SULFATE-MG SULF 17.5-3.13-1.6 GM/177ML PO SOLN: 1.0000 | Freq: Once | ORAL | 0 refills | Status: AC

## 2021-12-23 NOTE — Progress Notes (Signed)
No egg or soy allergy known to patient  No issues known to pt with past sedation with any surgeries or procedures Patient denies ever being told they had issues or difficulty with intubation  No FH of Malignant Hyperthermia Pt is not on diet pills Pt is not on  home 02  Pt is not on blood thinners  Pt denies issues with constipation  No A fib or A flutter Have any cardiac testing pending--no Pt instructed to use [employer] or GoodRx for a price reduction on prep   

## 2021-12-26 ENCOUNTER — Other Ambulatory Visit: Payer: Self-pay

## 2021-12-26 ENCOUNTER — Emergency Department (HOSPITAL_BASED_OUTPATIENT_CLINIC_OR_DEPARTMENT_OTHER)
Admission: EM | Admit: 2021-12-26 | Discharge: 2021-12-26 | Disposition: A | Discharge status: Home / Self Care | Payer: BC Managed Care – PPO | Attending: Emergency Medicine | Admitting: Emergency Medicine

## 2021-12-26 DIAGNOSIS — M791 Myalgia, unspecified site: Secondary | ICD-10-CM | POA: Diagnosis present

## 2021-12-26 DIAGNOSIS — Z1152 Encounter for screening for COVID-19: Secondary | ICD-10-CM | POA: Insufficient documentation

## 2021-12-26 DIAGNOSIS — J101 Influenza due to other identified influenza virus with other respiratory manifestations: Secondary | ICD-10-CM | POA: Diagnosis not present

## 2021-12-26 LAB — RESP PANEL BY RT-PCR (RSV, FLU A&B, COVID)  RVPGX2
Influenza A by PCR: POSITIVE — AB
Influenza B by PCR: NEGATIVE
Resp Syncytial Virus by PCR: NEGATIVE
SARS Coronavirus 2 by RT PCR: NEGATIVE

## 2021-12-26 MED ORDER — ONDANSETRON 4 MG PO TBDP: 4.0000 mg | ORAL_TABLET | Freq: Three times a day (TID) | ORAL | 1 refills | Status: DC | PRN

## 2021-12-26 NOTE — ED Triage Notes (Signed)
Patient presents to ED via POV from home. Here with generalized body aches and fever.

## 2021-12-26 NOTE — Discharge Instructions (Signed)
Influenza A positive.  Fits your symptoms.  Return for any new or worse symptoms particularly if you run into difficulty breathing.  Otherwise over-the-counter cold and flu medicine.  Take the Zofran ODT for nausea vomiting.

## 2021-12-26 NOTE — ED Provider Notes (Signed)
[facility] EMERGENCY DEPARTMENT Provider Note   CSN: [id number] Arrival date & time: 12/26/21  3419     History  Chief Complaint  Patient presents with   Generalized Body Aches    Ruth [name] is a 51 y.o. female.  Patient with onset of flulike symptoms on Wednesday.  Main complaint right now is fever and bodyaches.  Started with some nasal congestion and a little bit of cough last evening.  Also has been having rare but some nausea and vomiting.  No diarrhea.  No one else sick at home.  Patient did have the flu vaccine this year.  Past medical history noncontributory.       Home Medications Prior to Admission medications   Medication Sig Start Date End Date Taking? Authorizing Provider  ondansetron (ZOFRAN-ODT) 4 MG disintegrating tablet Take 1 tablet (4 mg total) by mouth every 8 (eight) hours as needed. 12/26/21  Yes Fredia Sorrow, MD  EPINEPHrine 0.3 mg/0.3 mL IJ SOAJ injection Inject 0.3 mg into the muscle as needed for anaphylaxis. Patient not taking: Reported on 12/23/2021 04/26/20   Suzy Bouchard, PA-C  ezetimibe (ZETIA) 10 MG tablet Take 1 tablet (10 mg total) by mouth daily. Patient not taking: Reported on 12/23/2021 10/02/21   Howard Pouch A, DO  fluticasone (FLONASE) 50 MCG/ACT nasal spray SPRAY 2 SPRAYS INTO EACH NOSTRIL EVERY DAY 10/02/21   Kuneff, Renee A, DO  meloxicam (MOBIC) 15 MG tablet One tab PO every 24 hours with a meal for 2 weeks, then once every 24 hours prn pain. Patient not taking: Reported on 12/23/2021 10/02/21   Howard Pouch A, DO  montelukast (SINGULAIR) 10 MG tablet Take 1 tablet (10 mg total) by mouth at bedtime. Patient not taking: Reported on 12/23/2021 10/02/21   Howard Pouch A, DO  ondansetron (ZOFRAN) 4 MG tablet Take 1 tablet (4 mg total) by mouth every 8 (eight) hours as needed for nausea or vomiting. Patient not taking: Reported on 12/23/2021 07/22/21   Howard Pouch A, DO  pantoprazole (PROTONIX) 20 MG tablet  Take 1 tablet (20 mg total) by mouth daily. Patient not taking: Reported on 12/23/2021 10/02/21   Howard Pouch A, DO  VITAMIN D PO Take 1 capsule by mouth daily.    [provider]      Allergies    Other, Crestor [rosuvastatin], Lipitor [atorvastatin], Amoxicillin, and Shellfish-derived products    Review of Systems   Review of Systems  Constitutional:  Positive for fever. Negative for chills.  HENT:  Positive for congestion. Negative for ear pain and sore throat.   Eyes:  Negative for pain and visual disturbance.  Respiratory:  Positive for cough. Negative for shortness of breath.   Cardiovascular:  Negative for chest pain and palpitations.  Gastrointestinal:  Positive for nausea and vomiting. Negative for abdominal pain.  Genitourinary:  Negative for dysuria and hematuria.  Musculoskeletal:  Positive for myalgias. Negative for arthralgias and back pain.  Skin:  Negative for color change and rash.  Neurological:  Negative for seizures and syncope.  All other systems reviewed and are negative.   Physical Exam Updated Vital Signs BP (!) 143/92   Pulse 68   Temp 98.4 F (36.9 C) (Oral)   Resp 17   LMP 01/23/2018   SpO2 96%  Physical Exam Vitals and nursing note reviewed.  Constitutional:      General: She is not in acute distress.    Appearance: Normal appearance. She is well-developed.  HENT:  Head: Normocephalic and atraumatic.  Eyes:     Extraocular Movements: Extraocular movements intact.     Conjunctiva/sclera: Conjunctivae normal.     Pupils: Pupils are equal, round, and reactive to light.  Cardiovascular:     Rate and Rhythm: Normal rate and regular rhythm.     Heart sounds: No murmur heard. Pulmonary:     Effort: Pulmonary effort is normal. No respiratory distress.     Breath sounds: Normal breath sounds. No wheezing, rhonchi or rales.  Abdominal:     Palpations: Abdomen is soft.     Tenderness: There is no abdominal tenderness.  Musculoskeletal:         General: No swelling.     Cervical back: Normal range of motion and neck supple.  Skin:    General: Skin is warm and dry.     Capillary Refill: Capillary refill takes less than 2 seconds.  Neurological:     General: No focal deficit present.     Mental Status: She is alert and oriented to person, place, and time.     Cranial Nerves: No cranial nerve deficit.     Sensory: No sensory deficit.  Psychiatric:        Mood and Affect: Mood normal.     ED Results / Procedures / Treatments   Labs (all labs ordered are listed, but only abnormal results are displayed) Labs Reviewed  RESP PANEL BY RT-PCR (RSV, FLU A&B, COVID)  RVPGX2 - Abnormal; Notable for the following components:      Result Value   Influenza A by PCR POSITIVE (*)    All other components within normal limits    EKG None  Radiology No results found.  Procedures Procedures    Medications Ordered in ED Medications - No data to display  ED Course/ Medical Decision Making/ A&P                           Medical Decision Making Risk Prescription drug management.  Patient nontoxic no acute distress.  Oxygen saturations are good.  Lungs are clear bilaterally.  Respiratory panel positive for influenza A.  Patient's symptoms consistent with that.  Patient has been having some difficulty with some rare nausea and vomiting so we will treat with Zofran ODT prescription provided.  Otherwise recommend extra strength Tylenol Motrin for the fever and bodyaches.  And over-the-counter cold and flu medicine.  Patient made aware that over-the-counter cold and flu medicine does have Tylenol in it.  And you cannot double up on the Tylenol.   Final Clinical Impression(s) / ED Diagnoses Final diagnoses:  Influenza A    Rx / DC Orders ED Discharge Orders          Ordered    ondansetron (ZOFRAN-ODT) 4 MG disintegrating tablet  Every 8 hours PRN        12/26/21 1143              Fredia Sorrow, MD 12/26/21  1147

## 2021-12-30 ENCOUNTER — Telehealth: Payer: Self-pay

## 2021-12-30 NOTE — Telephone Encounter (Signed)
Transition Care Management Unsuccessful Follow-up Telephone Call  Date of discharge and from where:  12/26/2021  Attempts:  1st Attempt  Reason for unsuccessful TCM follow-up call:  Left voice message

## 2022-01-05 ENCOUNTER — Ambulatory Visit: Payer: BC Managed Care – PPO | Admitting: Family Medicine

## 2022-01-14 ENCOUNTER — Encounter: Payer: Self-pay | Admitting: Gastroenterology

## 2022-01-18 ENCOUNTER — Ambulatory Visit (AMBULATORY_SURGERY_CENTER): Payer: BC Managed Care – PPO | Admitting: Gastroenterology

## 2022-01-18 ENCOUNTER — Encounter: Payer: Self-pay | Admitting: Gastroenterology

## 2022-01-18 VITALS — BP 116/74 | HR 65 | Temp 97.1°F | Resp 9 | Ht 64.0 in | Wt 161.0 lb

## 2022-01-18 DIAGNOSIS — Z1211 Encounter for screening for malignant neoplasm of colon: Secondary | ICD-10-CM

## 2022-01-18 DIAGNOSIS — K5730 Diverticulosis of large intestine without perforation or abscess without bleeding: Secondary | ICD-10-CM

## 2022-01-18 DIAGNOSIS — D125 Benign neoplasm of sigmoid colon: Secondary | ICD-10-CM

## 2022-01-18 DIAGNOSIS — D124 Benign neoplasm of descending colon: Secondary | ICD-10-CM

## 2022-01-18 DIAGNOSIS — K640 First degree hemorrhoids: Secondary | ICD-10-CM

## 2022-01-18 MED ORDER — SODIUM CHLORIDE 0.9 % IV SOLN: 500.0000 mL | INTRAVENOUS | Status: DC

## 2022-01-18 NOTE — Op Note (Signed)
[facility] Patient Name: Ruth [name] Procedure Date: 01/18/2022 2:28 PM MRN: [id number] Endoscopist: Gerrit Heck , MD, [id number] Age: 52 Referring MD:  Date of Birth: Feb 15, 1970 Gender: Female Account #: [id number] Procedure:                Colonoscopy Indications:              Screening for colorectal malignant neoplasm, This                            is the patient's first colonoscopy Medicines:                Monitored Anesthesia Care Procedure:                Pre-Anesthesia Assessment:                           - Prior to the procedure, a History and Physical                            was performed, and patient medications and                            allergies were reviewed. The patient's tolerance of                            previous anesthesia was also reviewed. The risks                            and benefits of the procedure and the sedation                            options and risks were discussed with the patient.                            All questions were answered, and informed consent                            was obtained. Prior Anticoagulants: The patient has                            taken no anticoagulant or antiplatelet agents. ASA                            Grade Assessment: II - A patient with mild systemic                            disease. After reviewing the risks and benefits,                            the patient was deemed in satisfactory condition to                            undergo the procedure.  After obtaining informed consent, the colonoscope                            was passed under direct vision. Throughout the                            procedure, the patient's blood pressure, pulse, and                            oxygen saturations were monitored continuously. The                            Olympus CF-HQ190L [id number]) Colonoscope was                            introduced through the  anus and advanced to the the                            cecum, identified by appendiceal orifice and                            ileocecal valve. The colonoscopy was performed                            without difficulty. The patient tolerated the                            procedure well. The quality of the bowel                            preparation was good. The ileocecal valve,                            appendiceal orifice, and rectum were photographed. Scope In: 2:44:54 PM Scope Out: 3:01:31 PM Scope Withdrawal Time: 0 hours 12 minutes 9 seconds  Total Procedure Duration: 0 hours 16 minutes 37 seconds  Findings:                 The perianal and digital rectal examinations were                            normal.                           Three sessile polyps were found in the sigmoid                            colon (1) and descending colon (2). The polyps were                            2 to 4 mm in size. These polyps were removed with a                            cold snare. Resection and retrieval were complete.  Estimated blood loss was minimal.                           Multiple medium-mouthed and small-mouthed                            diverticula were found in the sigmoid colon and                            transverse colon.                           Non-bleeding internal hemorrhoids were found during                            retroflexion. The hemorrhoids were small. Complications:            No immediate complications. Estimated Blood Loss:     Estimated blood loss was minimal. Impression:               - Three 2 to 4 mm polyps in the sigmoid colon and                            in the descending colon, removed with a cold snare.                            Resected and retrieved.                           - Diverticulosis in the sigmoid colon and in the                            transverse colon.                           - Non-bleeding  internal hemorrhoids. Recommendation:           - Patient has a contact number available for                            emergencies. The signs and symptoms of potential                            delayed complications were discussed with the                            patient. Return to normal activities tomorrow.                            Written discharge instructions were provided to the                            patient.                           - Resume previous diet.                           -  Continue present medications.                           - Await pathology results.                           - Repeat colonoscopy for surveillance based on                            pathology results.                           - Return to GI clinic PRN.                           - Internal hemorrhoids were noted on this study and                            may be amenable to hemorrhoid band ligation. If you                            are interested in further treatment of these                            hemorrhoids with band ligation, please contact my                            clinic to set up an appointment for evaluation and                            treatment. Gerrit Heck, MD 01/18/2022 3:05:17 PM

## 2022-01-18 NOTE — Progress Notes (Signed)
A and O x3. Report to RN. Tolerated MAC anesthesia well. 

## 2022-01-18 NOTE — Progress Notes (Signed)
Pt's states no medical or surgical changes since previsit or office visit. 

## 2022-01-18 NOTE — Progress Notes (Signed)
Called to room to assist during endoscopic procedure.  Patient ID and intended procedure confirmed with present staff. Received instructions for my participation in the procedure from the performing physician.

## 2022-01-18 NOTE — Patient Instructions (Signed)
Await pathology results.  Handouts on polyps, diverticulosis, and hemorrhoids provided.  YOU HAD AN ENDOSCOPIC PROCEDURE TODAY AT [facility]:   Refer to the procedure report that was given to you for any specific questions about what was found during the examination.  If the procedure report does not answer your questions, please call your gastroenterologist to clarify.  If you requested that your care partner not be given the details of your procedure findings, then the procedure report has been included in a sealed envelope for you to review at your convenience later.  YOU SHOULD EXPECT: Some feelings of bloating in the abdomen. Passage of more gas than usual.  Walking can help get rid of the air that was put into your GI tract during the procedure and reduce the bloating. If you had a lower endoscopy (such as a colonoscopy or flexible sigmoidoscopy) you may notice spotting of blood in your stool or on the toilet paper. If you underwent a bowel prep for your procedure, you may not have a normal bowel movement for a few days.  Please Note:  You might notice some irritation and congestion in your nose or some drainage.  This is from the oxygen used during your procedure.  There is no need for concern and it should clear up in a day or so.  SYMPTOMS TO REPORT IMMEDIATELY:  Following lower endoscopy (colonoscopy or flexible sigmoidoscopy):  Excessive amounts of blood in the stool  Significant tenderness or worsening of abdominal pains  Swelling of the abdomen that is new, acute  Fever of 100F or higher   For urgent or emergent issues, a gastroenterologist can be reached at any hour by calling [phone]. Do not use MyChart messaging for urgent concerns.    DIET:  We do recommend a small meal at first, but then you may proceed to your regular diet.  Drink plenty of fluids but you should avoid alcoholic beverages for 24 hours.  ACTIVITY:  You should plan to take it easy  for the rest of today and you should NOT DRIVE or use heavy machinery until tomorrow (because of the sedation medicines used during the test).    FOLLOW UP: Our staff will call the number listed on your records the next business day following your procedure.  We will call around 7:15- 8:00 am to check on you and address any questions or concerns that you may have regarding the information given to you following your procedure. If we do not reach you, we will leave a message.     If any biopsies were taken you will be contacted by phone or by letter within the next 1-3 weeks.  Please call us at [phone] if you have not heard about the biopsies in 3 weeks.    SIGNATURES/CONFIDENTIALITY: You and/or your care partner have signed paperwork which will be entered into your electronic medical record.  These signatures attest to the fact that that the information above on your After Visit Summary has been reviewed and is understood.  Full responsibility of the confidentiality of this discharge information lies with you and/or your care-partner.

## 2022-01-18 NOTE — Progress Notes (Signed)
GASTROENTEROLOGY PROCEDURE H&P NOTE   Primary Care Physician: Ruth Hillock, DO    Reason for Procedure:  Colon Cancer screening  Plan:    Colonoscopy  Patient is appropriate for endoscopic procedure(s) in the ambulatory ([place]) setting.  The nature of the procedure, as well as the risks, benefits, and alternatives were carefully and thoroughly reviewed with the patient. Ample time for discussion and questions allowed. The patient understood, was satisfied, and agreed to proceed.     HPI: Ruth [name] is [name] 52 y.o. female who presents for colonoscopy for routine Colon Cancer screening.  No active GI symptoms.  No known family history of colon cancer or related malignancy.  Patient is otherwise without complaints or active issues today.  Past Medical History:  Diagnosis Date   Allergy    Arthritis    Asthma    GERD (gastroesophageal reflux disease)    History of abdominal pain    2007- mild decreased GB EF; 2008-collapsing R ovarian cyst; 2012 uterine fibroid x 1.   HV (hallux valgus)    repaired as child   Hyperlipidemia    Prediabetes     Past Surgical History:  Procedure Laterality Date   BUNIONECTOMY Right    performed as child   US TRANSVAGINAL COMPLETE  09/17/2019    Prior to Admission medications   Medication Sig Start Date End Date Taking? Authorizing Provider  VITAMIN D PO Take 1 capsule by mouth daily.   Yes [provider]  EPINEPHrine 0.3 mg/0.3 mL IJ SOAJ injection Inject 0.3 mg into the muscle as needed for anaphylaxis. Patient not taking: Reported on 12/23/2021 04/26/20   Ruth Bouchard, PA-C  ezetimibe (ZETIA) 10 MG tablet Take 1 tablet (10 mg total) by mouth daily. Patient not taking: Reported on 12/23/2021 10/02/21   Ruth Pouch A, DO  fluticasone (FLONASE) 50 MCG/ACT nasal spray SPRAY 2 SPRAYS INTO EACH NOSTRIL EVERY DAY 10/02/21   Kuneff, Renee A, DO  meloxicam (MOBIC) 15 MG tablet One tab PO every 24 hours with [name] meal for 2  weeks, then once every 24 hours prn pain. Patient not taking: Reported on 12/23/2021 10/02/21   Ruth Pouch A, DO  montelukast (SINGULAIR) 10 MG tablet Take 1 tablet (10 mg total) by mouth at bedtime. Patient not taking: Reported on 12/23/2021 10/02/21   Ruth Pouch A, DO  ondansetron (ZOFRAN) 4 MG tablet Take 1 tablet (4 mg total) by mouth every 8 (eight) hours as needed for nausea or vomiting. Patient not taking: Reported on 12/23/2021 07/22/21   Ruth Pouch A, DO  ondansetron (ZOFRAN-ODT) 4 MG disintegrating tablet Take 1 tablet (4 mg total) by mouth every 8 (eight) hours as needed. Patient not taking: Reported on 01/18/2022 12/26/21   Ruth Sorrow, MD  pantoprazole (PROTONIX) 20 MG tablet Take 1 tablet (20 mg total) by mouth daily. 10/02/21   Ruth Pouch A, DO    Current Outpatient Medications  Medication Sig Dispense Refill   VITAMIN D PO Take 1 capsule by mouth daily.     EPINEPHrine 0.3 mg/0.3 mL IJ SOAJ injection Inject 0.3 mg into the muscle as needed for anaphylaxis. (Patient not taking: Reported on 12/23/2021) 1 each 0   ezetimibe (ZETIA) 10 MG tablet Take 1 tablet (10 mg total) by mouth daily. (Patient not taking: Reported on 12/23/2021) 90 tablet 3   fluticasone (FLONASE) 50 MCG/ACT nasal spray SPRAY 2 SPRAYS INTO EACH NOSTRIL EVERY DAY 9.9 mL 11   meloxicam (MOBIC) 15 MG  tablet One tab PO every 24 hours with [name] meal for 2 weeks, then once every 24 hours prn pain. (Patient not taking: Reported on 12/23/2021) 30 tablet 3   montelukast (SINGULAIR) 10 MG tablet Take 1 tablet (10 mg total) by mouth at bedtime. (Patient not taking: Reported on 12/23/2021) 90 tablet 3   ondansetron (ZOFRAN) 4 MG tablet Take 1 tablet (4 mg total) by mouth every 8 (eight) hours as needed for nausea or vomiting. (Patient not taking: Reported on 12/23/2021) 20 tablet 2   ondansetron (ZOFRAN-ODT) 4 MG disintegrating tablet Take 1 tablet (4 mg total) by mouth every 8 (eight) hours as needed. (Patient not  taking: Reported on 01/18/2022) 12 tablet 1   pantoprazole (PROTONIX) 20 MG tablet Take 1 tablet (20 mg total) by mouth daily. 90 tablet 3   Current Facility-Administered Medications  Medication Dose Route Frequency Provider Last Rate Last Admin   0.9 %  sodium chloride infusion  500 mL Intravenous Continuous Ruth Prest V, DO        Allergies as of 01/18/2022 - Review Complete 01/18/2022  Allergen Reaction Noted   Other Anaphylaxis 06/25/2014   Ruth [name] [rosuvastatin]  09/30/2020   Ruth [name] [atorvastatin] Nausea Only 09/30/2020   Ruth [name] Rash 06/24/2014   Ruth [name] Rash and Swelling 06/24/2014    Family History  Problem Relation Age of Onset   Hypertension Mother    Alcohol abuse Father    Glaucoma Maternal Grandmother    Asthma Maternal Grandfather    Cancer Paternal Grandfather        lung   Colon cancer Neg Hx    Colon polyps Neg Hx    Esophageal cancer Neg Hx    Rectal cancer Neg Hx    Stomach cancer Neg Hx     Social History   Socioeconomic History   Marital status: Single    Spouse name: Not on file   Number of children: 0   Years of education: Not on file   Highest education level: Master's degree (e.g., MA, Ruth, MEng, MEd, MSW, MBA)  Occupational History   Occupation: Pharmacist, hospital    Comment: HS, basketball coach  Tobacco Use   Smoking status: Never   Smokeless tobacco: Never  Vaping Use   Vaping Use: Never used  Substance and Sexual Activity   Alcohol use: No    Comment: occasional   Drug use: No   Sexual activity: Yes  Other Topics Concern   Not on file  Social History Narrative   ** Merged History Encounter **       Ruth [name] works FT as Pharmacist, hospital. She lives alone.    Social Determinants of Health   Financial Resource Strain: Low Risk  (07/07/2021)   Overall Financial Resource Strain (CARDIA)    Difficulty of Paying Living Expenses: Not hard at all  Food Insecurity: No Food Insecurity (07/07/2021)   Hunger Vital Sign    Worried  About Running Out of Food in the Last Year: Never true    Ran Out of Food in the Last Year: Never true  Transportation Needs: No Transportation Needs (07/07/2021)   PRAPARE - Hydrologist (Medical): No    Lack of Transportation (Non-Medical): No  Physical Activity: Insufficiently Active (07/07/2021)   Exercise Vital Sign    Days of Exercise per Week: 4 days    Minutes of Exercise per Session: 10 min  Stress: Stress Concern Present (07/07/2021)   [place]  Stress Questionnaire    Feeling of Stress : To some extent  Social Connections: Moderately Integrated (07/07/2021)   Social Connection and Isolation Panel [NHANES]    Frequency of Communication with Friends and Family: More than three times [name] week    Frequency of Social Gatherings with Friends and Family: Twice [name] week    Attends Religious Services: 1 to 4 times per year    Active Member of [employer] or Organizations: Yes    Attends Archivist Meetings: 1 to 4 times per year    Marital Status: Never married  Human resources officer Violence: Not on file    Physical Exam: Vital signs in last 24 hours: '@BP'$  (!) 143/90   Pulse 75   Temp (!) 97.1 F (36.2 C) (Temporal)   Ht '5\' 4"'$  (1.626 m)   Wt 161 lb (73 kg)   LMP 01/23/2018   SpO2 99%   BMI 27.64 kg/m  GEN: NAD EYE: Sclerae anicteric ENT: MMM CV: Non-tachycardic Pulm: CTA b/l GI: Soft, NT/ND NEURO:  Alert & Oriented x [place], DO [facility]   01/18/2022 2:37 PM

## 2022-01-19 ENCOUNTER — Telehealth: Payer: Self-pay | Admitting: *Deleted

## 2022-01-19 ENCOUNTER — Encounter: Payer: Self-pay | Admitting: Family Medicine

## 2022-01-19 NOTE — Telephone Encounter (Signed)
  Follow up Call-     01/18/2022    2:06 PM  Call back number  Post procedure Call Back phone  # [phone]  Permission to leave phone message Yes     Patient questions:  Do you have a fever, pain , or abdominal swelling? No. Pain Score  0 *  Have you tolerated food without any problems? Yes.    Have you been able to return to your normal activities? Yes.    Do you have any questions about your discharge instructions: Diet   No. Medications  No. Follow up visit  No.  Do you have questions or concerns about your Care? No.  Actions: * If pain score is 4 or above: No action needed, pain <4.

## 2022-01-19 NOTE — Telephone Encounter (Signed)
FYI

## 2022-01-26 ENCOUNTER — Encounter: Payer: Self-pay | Admitting: Gastroenterology

## 2022-02-09 ENCOUNTER — Ambulatory Visit: Payer: BC Managed Care – PPO | Admitting: Sports Medicine

## 2022-02-09 DIAGNOSIS — G5601 Carpal tunnel syndrome, right upper limb: Secondary | ICD-10-CM | POA: Diagnosis not present

## 2022-02-09 MED ORDER — GABAPENTIN 300 MG PO CAPS: 300.0000 mg | ORAL_CAPSULE | Freq: Every day | ORAL | 3 refills | Status: DC

## 2022-02-09 NOTE — Progress Notes (Signed)
    Procedures performed today:    None.  Independent interpretation of notes and tests performed by another provider:   None.  Brief History, Exam, Impression, and Recommendations:    Carpal tunnel syndrome, right Pleasant 52 year old female, known carpal tunnel syndrome, she has done well historically with median nerve hydrodissections, the last of which was in January 2023, now having recurrence of numbness and tingling right hand, I would like her to get more consistent with conservative treatment including nighttime splinting, we will add Neurontin at night and home conditioning, if insufficient improvement after 6 weeks we will proceed again with median nerve hydrodissection.    ____________________________________________ Gwen Her. Dianah Field, M.D., ABFM., CAQSM., AME. Primary Care and Sports Medicine [facility]  Adjunct Professor of [place] [employer] [facility] of Medicine  Risk manager

## 2022-02-09 NOTE — Assessment & Plan Note (Signed)
Pleasant 52 year old female, known carpal tunnel syndrome, she has done well historically with median nerve hydrodissections, the last of which was in January 2023, now having recurrence of numbness and tingling right hand, I would like her to get more consistent with conservative treatment including nighttime splinting, we will add Neurontin at night and home conditioning, if insufficient improvement after 6 weeks we will proceed again with median nerve hydrodissection.

## 2022-02-13 ENCOUNTER — Encounter: Payer: Self-pay | Admitting: Sports Medicine

## 2022-02-23 ENCOUNTER — Encounter: Payer: Self-pay | Admitting: Sports Medicine

## 2022-02-26 ENCOUNTER — Ambulatory Visit: Payer: BC Managed Care – PPO | Admitting: Sports Medicine

## 2022-02-26 ENCOUNTER — Ambulatory Visit (INDEPENDENT_AMBULATORY_CARE_PROVIDER_SITE_OTHER): Payer: BC Managed Care – PPO

## 2022-02-26 DIAGNOSIS — G5601 Carpal tunnel syndrome, right upper limb: Secondary | ICD-10-CM

## 2022-02-26 DIAGNOSIS — M542 Cervicalgia: Secondary | ICD-10-CM

## 2022-02-26 DIAGNOSIS — M5030 Other cervical disc degeneration, unspecified cervical region: Secondary | ICD-10-CM | POA: Diagnosis not present

## 2022-02-26 MED ORDER — CYCLOBENZAPRINE HCL 10 MG PO TABS: ORAL_TABLET | ORAL | 0 refills | Status: DC

## 2022-02-26 MED ORDER — PREDNISONE 50 MG PO TABS: ORAL_TABLET | ORAL | 0 refills | Status: DC

## 2022-02-26 NOTE — Assessment & Plan Note (Signed)
Pleasant 52 year old female, increasing numbness and tingling right hand, she is starting to feel paresthesias coming from her neck and periscapular region, he does have x-rays from the past that did show cervical DDD. Unable to tolerate gabapentin. She should continue nighttime splinting and we are can add cervical spine x-rays, prednisone, Flexeril and formal physical therapy. Return to see me in 6 weeks.

## 2022-02-26 NOTE — Progress Notes (Signed)
    Procedures performed today:    None.  Independent interpretation of notes and tests performed by another provider:   None.  Brief History, Exam, Impression, and Recommendations:    Carpal tunnel syndrome, right Pleasant 52 year old female, increasing numbness and tingling right hand, she is starting to feel paresthesias coming from her neck and periscapular region, he does have x-rays from the past that did show cervical DDD. Unable to tolerate gabapentin. She should continue nighttime splinting and we are can add cervical spine x-rays, prednisone, Flexeril and formal physical therapy. Return to see me in 6 weeks.  DDD (degenerative disc disease), cervical Pleasant 52 year old female, increasing numbness and tingling right hand, she is starting to feel paresthesias coming from her neck and periscapular region, he does have x-rays from the past that did show cervical DDD. Unable to tolerate gabapentin. She should continue nighttime splinting and we are can add cervical spine x-rays, prednisone, Flexeril and formal physical therapy. Return to see me in 6 weeks.    ____________________________________________ Gwen Her. Dianah Field, M.D., ABFM., CAQSM., AME. Primary Care and Sports Medicine  [facility]  Adjunct Professor of [place] of [facility] of Medicine  Risk manager

## 2022-03-23 ENCOUNTER — Ambulatory Visit: Payer: BC Managed Care – PPO | Admitting: Sports Medicine

## 2022-04-08 ENCOUNTER — Ambulatory Visit: Payer: BC Managed Care – PPO | Admitting: Sports Medicine

## 2022-04-08 ENCOUNTER — Other Ambulatory Visit (INDEPENDENT_AMBULATORY_CARE_PROVIDER_SITE_OTHER): Payer: BC Managed Care – PPO

## 2022-04-08 DIAGNOSIS — G5601 Carpal tunnel syndrome, right upper limb: Secondary | ICD-10-CM

## 2022-04-08 DIAGNOSIS — M5030 Other cervical disc degeneration, unspecified cervical region: Secondary | ICD-10-CM | POA: Diagnosis not present

## 2022-04-08 MED ORDER — PREGABALIN 25 MG PO CAPS
ORAL_CAPSULE | ORAL | 3 refills | Status: DC
Start: 2022-04-08 — End: 2022-05-20

## 2022-04-08 NOTE — Progress Notes (Signed)
    Procedures performed today:    Procedure: Real-time Ultrasound Guided hydrodissection of the right median nerve at the carpal tunnel Device: Samsung HS60 Verbal informed consent obtained.  Time-out conducted.  Noted no overlying erythema, induration, or other signs of local infection.  Skin prepped in a sterile fashion.  Local anesthesia: Topical Ethyl chloride.  With sterile technique and under real time ultrasound guidance: Using a 25-gauge needle advanced into the carpal tunnel, taking care to avoid intraneural injection I injected medication both superficial to and deep to the median nerve freeing it from surrounding structures, I then redirected the needle deep and injected further medication around the flexor tendons deep within the carpal tunnel for a total of 1 cc kenalog 40, 5 cc 1% lidocaine without epinephrine. Completed without difficulty  Advised to call if fevers/chills, erythema, induration, drainage, or persistent bleeding.  Images permanently stored and available for review in PACS.  Impression: Technically successful ultrasound guided median nerve hydrodissection.  Independent interpretation of notes and tests performed by another provider:   None.  Brief History, Exam, Impression, and Recommendations:    Carpal tunnel syndrome, right This pleasant 52 year old female returns, increasing numbness and tingling of right hand, unable to tolerate gabapentin, she did have a median nerve hydrodissection on the right side back in January 2023. At this point she has failed 6 weeks of additional nighttime splinting, steroids orally, home physical therapy, proceeding with a right-sided median nerve hydrodissection again.  Return in 6 weeks as needed.  DDD (degenerative disc disease), cervical Also has multilevel cervical DDD with increasing periscapular discomfort, occasional radicular symptoms down the upper arm potentially to the hand but difficult to discern from carpal  tunnel symptoms. Was intolerant of gabapentin in the past, she will continue some home conditioning, not yet ready to consider cervical epidural but we will add Lyrica in a slow up taper. Also having some lumbar radicular symptoms down the right leg, Lyrica as above should be helpful however if persistence of symptoms we will additionally investigate [name] lumbar spine.    ____________________________________________ Ruth [name]. Dianah Field, M.D., ABFM., CAQSM., AME. Primary Care and Sports Medicine [place] [facility]  Adjunct Professor of [place] of [facility] of Medicine  Risk manager

## 2022-04-08 NOTE — Assessment & Plan Note (Signed)
This pleasant 52 year old female returns, increasing numbness and tingling of right hand, unable to tolerate gabapentin, she did have a median nerve hydrodissection on the right side back in January 2023. At this point she has failed 6 weeks of additional nighttime splinting, steroids orally, home physical therapy, proceeding with a right-sided median nerve hydrodissection again.  Return in 6 weeks as needed.

## 2022-04-08 NOTE — Assessment & Plan Note (Signed)
Also has multilevel cervical DDD with increasing periscapular discomfort, occasional radicular symptoms down the upper arm potentially to the hand but difficult to discern from carpal tunnel symptoms. Was intolerant of gabapentin in the past, she will continue some home conditioning, not yet ready to consider cervical epidural but we will add Lyrica in a slow up taper. Also having some lumbar radicular symptoms down the right leg, Lyrica as above should be helpful however if persistence of symptoms we will additionally investigate her lumbar spine.

## 2022-05-20 ENCOUNTER — Ambulatory Visit: Payer: BC Managed Care – PPO | Admitting: Sports Medicine

## 2022-05-20 DIAGNOSIS — M5030 Other cervical disc degeneration, unspecified cervical region: Secondary | ICD-10-CM

## 2022-05-20 DIAGNOSIS — G5601 Carpal tunnel syndrome, right upper limb: Secondary | ICD-10-CM

## 2022-05-20 NOTE — Progress Notes (Signed)
    Procedures performed today:    None.  Independent interpretation of notes and tests performed by another provider:   None.  Brief History, Exam, Impression, and Recommendations:    DDD (degenerative disc disease), cervical Known multilevel cervical DDD, periscapular discomfort right-sided, we added Lyrica, she only needed 1 capsule at night and is doing really well without any pain at all. She understands that she can titrate up if needed but otherwise return as needed for this, no changes in plan.  Carpal tunnel syndrome, right Hydrodissection performed in April, completely symptom-free, I have recommended that she continue nocturnal splinting and conditioning to increase the time until she needs another hydrodissection. Return as needed.    ____________________________________________ Ihor Austin. Benjamin Stain, M.D., ABFM., CAQSM., AME. Primary Care and Sports Medicine [place] [facility]  Adjunct Professor of Family Medicine  [place] of [facility] of Medicine  Restaurant manager, fast food

## 2022-05-20 NOTE — Assessment & Plan Note (Signed)
Hydrodissection performed in April, completely symptom-free, I have recommended that she continue nocturnal splinting and conditioning to increase the time until she needs another hydrodissection. Return as needed.

## 2022-05-20 NOTE — Assessment & Plan Note (Signed)
Known multilevel cervical DDD, periscapular discomfort right-sided, we added Lyrica, she only needed 1 capsule at night and is doing really well without any pain at all. She understands that she can titrate up if needed but otherwise return as needed for this, no changes in plan.

## 2022-08-20 ENCOUNTER — Encounter: Payer: Self-pay | Admitting: Family Medicine

## 2022-08-20 ENCOUNTER — Ambulatory Visit: Payer: BC Managed Care – PPO | Admitting: Family Medicine

## 2022-08-20 VITALS — BP 126/77 | HR 62 | Temp 97.4°F | Wt 173.4 lb

## 2022-08-20 DIAGNOSIS — K219 Gastro-esophageal reflux disease without esophagitis: Secondary | ICD-10-CM

## 2022-08-20 MED ORDER — PANTOPRAZOLE SODIUM 40 MG PO TBEC: 40.0000 mg | DELAYED_RELEASE_TABLET | Freq: Every day | ORAL | 3 refills | Status: AC

## 2022-08-20 MED ORDER — SUCRALFATE 1 G PO TABS: 1.0000 g | ORAL_TABLET | Freq: Three times a day (TID) | ORAL | 0 refills | Status: DC

## 2022-08-20 MED ORDER — ONDANSETRON 4 MG PO TBDP: 4.0000 mg | ORAL_TABLET | Freq: Three times a day (TID) | ORAL | 0 refills | Status: DC | PRN

## 2022-08-20 NOTE — Progress Notes (Signed)
Ruth [name] , 01/11/1970, 52 y.o., female MRN: [id number] Patient Care Team    Relationship Specialty Notifications Start End  Natalia Leatherwood, DO PCP - General Family Medicine  09/25/14   Nita Sickle, MD Referring Physician Specialist  06/30/15     Chief Complaint  Patient presents with   Nausea    Bad taste in mouth; prevacid is not working; vomiting over the last week; starts with a HA     Subjective: Ruth [name] is a 52 y.o. female present to day with complaints of nausea and reflux symptoms. Pt  has a h/o of reflux and had been prescribed PPI this time last year for similar symptoms. She reports last week she had vomiting and headache. Vomiting was quite severe last week. She is trying to evaluate her diet to see what her trigger. She denies fever, chills.or resp symptoms.   Prior note 07/2021: Pt presents for an OV with complaints of hoarseness of couple months duration.  She states over the last couple months she has had hoarseness almost all the time.  She does report her new job has her talking at the top of her voice, which could be because she is draining.  She denies any sore throat or allergy symptoms.  She did take a Prevacid and felt that it might have helped.  She feels Advil made at worst.  She is experiencing nausea and vomit on occasions after meal.  She did for having mild upper quadrant pain on the left side with her nausea and vomit.  She does not drink alcohol.  Her triglycerides have been normal.  No history of gallstones.      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    Past Medical History:  Diagnosis Date   Allergy    Arthritis    Asthma    GERD (gastroesophageal reflux disease)    History of abdominal pain    2007- mild decreased GB EF; 2008-collapsing R ovarian cyst; 2012 uterine fibroid x 1.   HV (hallux valgus)    repaired as child   Hyperlipidemia    Prediabetes    Past Surgical History:  Procedure Laterality Date   BUNIONECTOMY Right    performed as child   US TRANSVAGINAL COMPLETE  09/17/2019   Family History  Problem Relation Age of Onset   Hypertension Mother    Alcohol abuse Father    Glaucoma Maternal Grandmother    Asthma Maternal Grandfather    Cancer Paternal Grandfather        lung   Colon cancer Neg Hx    Colon polyps Neg Hx    Esophageal cancer Neg Hx    Rectal cancer Neg Hx    Stomach  cancer Neg Hx    Allergies as of 08/20/2022       Reactions   Other Anaphylaxis   Seafood    Crestor [rosuvastatin]    DUB   Lipitor [atorvastatin] Nausea Only   Amoxicillin Rash   Shellfish-derived Products Rash, Swelling        Medication List        Accurate as of August 20, 2022  8:03 AM. If you have any questions, ask your nurse or doctor.          STOP taking these medications    ezetimibe 10 MG tablet Commonly known as: Zetia Stopped by: Felix Pacini   Lyrica 25 MG capsule Generic drug: pregabalin Stopped by: Felix Pacini   meloxicam 15 MG tablet Commonly known as: MOBIC Stopped by: Felix Pacini   montelukast 10 MG tablet Commonly known as: SINGULAIR Stopped by: Felix Pacini       TAKE these medications    EPINEPHrine 0.3 mg/0.3 mL Soaj injection Commonly known as: EPI-PEN Inject 0.3 mg into the muscle as needed for anaphylaxis.   estradiol 0.05 MG/24HR patch Commonly known as: VIVELLE-DOT Place 1 patch onto the skin 2 (two)  times a week.   fluticasone 50 MCG/ACT nasal spray Commonly known as: FLONASE SPRAY 2 SPRAYS INTO EACH NOSTRIL EVERY DAY   ondansetron 4 MG disintegrating tablet Commonly known as: ZOFRAN-ODT Take 1 tablet (4 mg total) by mouth every 8 (eight) hours as needed for nausea or vomiting. What changed: reasons to take this Changed by: Felix Pacini   ondansetron 4 MG tablet Commonly known as: ZOFRAN Take 1 tablet (4 mg total) by mouth every 8 (eight) hours as needed for nausea or vomiting.   pantoprazole 40 MG tablet Commonly known as: Protonix Take 1 tablet (40 mg total) by mouth daily. What changed:  medication strength how much to take Changed by: Felix Pacini   progesterone 100 MG capsule Commonly known as: PROMETRIUM Take 100 mg by mouth at bedtime.   sucralfate 1 g tablet Commonly known as: Carafate Take 1 tablet (1 g total) by mouth 4 (four) times daily -  with meals and at bedtime. Started by: Felix Pacini   VITAMIN D PO Take 1 capsule by mouth daily.        All past medical history, surgical history, allergies, family history, immunizations andmedications were updated in the EMR today and reviewed under the history and medication portions of their EMR.     ROS Negative, with the exception of above mentioned in HPI   Objective:  BP 126/77   Pulse 62   Temp (!) 97.4 F (36.3 C)   Wt 173 lb 6.4 oz (78.7 kg)   LMP 01/23/2018   SpO2 97%   BMI 29.76 kg/m  Body mass index is 29.76 kg/m. Physical Exam Vitals and nursing note reviewed.  Constitutional:      General: She is not in acute distress.    Appearance: Normal appearance. She is normal weight. She is not ill-appearing or toxic-appearing.  HENT:     Head: Normocephalic and atraumatic.     Mouth/Throat:     Mouth: Mucous membranes are moist.     Pharynx: No oropharyngeal exudate or posterior oropharyngeal erythema.  Eyes:     General: No scleral icterus.       Right eye: No discharge.        Left  eye: No discharge.     Extraocular Movements: Extraocular movements intact.  Conjunctiva/sclera: Conjunctivae normal.     Pupils: Pupils are equal, round, and reactive to light.  Cardiovascular:     Rate and Rhythm: Normal rate and regular rhythm.     Heart sounds: No murmur heard. Pulmonary:     Effort: Pulmonary effort is normal. No respiratory distress.     Breath sounds: Normal breath sounds. No wheezing, rhonchi or rales.  Abdominal:     General: Bowel sounds are increased.     Palpations: Abdomen is soft.     Tenderness: There is no guarding or rebound.  Musculoskeletal:     Cervical back: Neck supple.  Skin:    Findings: No rash.  Neurological:     Mental Status: She is alert and oriented to person, place, and time. Mental status is at baseline.     Motor: No weakness.     Coordination: Coordination normal.     Gait: Gait normal.  Psychiatric:        Mood and Affect: Mood normal.        Behavior: Behavior normal.        Thought Content: Thought content normal.        Judgment: Judgment normal.     No results found. No results found. No results found for this or any previous visit (from the past 24 hour(s)).   Assessment/Plan: MINHA AWWAD is a 52 y.o. female present for OV for  Gastroesophageal reflux disease without esophagitis Possible this episode started with viral infection, leading to gastritis.  Follow a reflux diet restart Protonix 40 mg daily Start Carafate 3 times daily AC nightly 2-4 weeks start Zofran 4 mg every 8 hours as needed for nausea and vomiting   Return in about 11 weeks (around 11/05/2022), or if symptoms worsen or fail to improve, for GERD.   Reviewed expectations re: course of current medical issues. Discussed self-management of symptoms. Outlined signs and symptoms indicating need for more acute intervention. Patient verbalized understanding and all questions were answered. Patient received an After-Visit Summary.    No  orders of the defined types were placed in this encounter.  Meds ordered this encounter  Medications   pantoprazole (PROTONIX) 40 MG tablet    Sig: Take 1 tablet (40 mg total) by mouth daily.    Dispense:  90 tablet    Refill:  3   sucralfate (CARAFATE) 1 g tablet    Sig: Take 1 tablet (1 g total) by mouth 4 (four) times daily -  with meals and at bedtime.    Dispense:  120 tablet    Refill:  0   ondansetron (ZOFRAN-ODT) 4 MG disintegrating tablet    Sig: Take 1 tablet (4 mg total) by mouth every 8 (eight) hours as needed for nausea or vomiting.    Dispense:  20 tablet    Refill:  0   Referral Orders  No referral(s) requested today     Note is dictated utilizing voice recognition software. Although note has been proof read prior to signing, occasional typographical errors still can be missed. If any questions arise, please do not hesitate to call for verification.   electronically signed by:  Felix Pacini, DO  [facility] - [place]

## 2022-08-20 NOTE — Patient Instructions (Addendum)
Return in about 11 weeks (around 11/05/2022), or if symptoms worsen or fail to improve, for GERD.        Great to see you today.  I have refilled the medication(s) we provide.   If labs were collected or images ordered, we will inform you of  results once we have received them and reviewed. We will contact you either by echart message, or telephone call.  Please give ample time to the testing facility, and our office to run,  receive and review results. Please do not call inquiring of results, even if you can see them in your chart. We will contact you as soon as we are able. If it has been over 1 week since the test was completed, and you have not yet heard from [place], then please call us.    - echart message- for normal results that have been seen by the patient already.   - telephone call: abnormal results or if patient has not viewed results in their echart.  If a referral to a specialist was entered for you, please call us in 2 weeks if you have not heard from the specialist office to schedule.

## 2022-09-11 ENCOUNTER — Other Ambulatory Visit: Payer: Self-pay | Admitting: Family Medicine

## 2022-10-06 ENCOUNTER — Encounter: Payer: BC Managed Care – PPO | Admitting: Family Medicine

## 2022-10-27 ENCOUNTER — Telehealth: Payer: Self-pay | Admitting: Family Medicine

## 2022-10-27 NOTE — Telephone Encounter (Signed)
Ok

## 2022-10-27 NOTE — Telephone Encounter (Signed)
Pt called to request a Transfer of Care -   From: Estill Bamberg, DO - [facility] at [facility]   To:  Sherlon Handing, MD - [facility] at [facility]   Are both providers alright with this request?   Please advise.   Respectfully,  IC

## 2022-10-28 NOTE — Telephone Encounter (Signed)
Ok with me. She is a very nice patient.

## 2022-10-29 NOTE — Telephone Encounter (Signed)
Called Pt to schedule TOC.  LVM for her to call us back.

## 2023-01-13 ENCOUNTER — Ambulatory Visit: Payer: Self-pay | Admitting: Family Medicine

## 2023-01-17 ENCOUNTER — Ambulatory Visit: Payer: Self-pay | Admitting: Family Medicine

## 2023-01-19 ENCOUNTER — Ambulatory Visit: Payer: Self-pay | Admitting: Family Medicine

## 2023-04-19 ENCOUNTER — Other Ambulatory Visit: Payer: Self-pay | Admitting: Family Medicine

## 2023-04-19 ENCOUNTER — Encounter: Payer: Self-pay | Admitting: Family Medicine

## 2023-04-19 ENCOUNTER — Ambulatory Visit (INDEPENDENT_AMBULATORY_CARE_PROVIDER_SITE_OTHER): Admitting: Family Medicine

## 2023-04-19 ENCOUNTER — Ambulatory Visit (INDEPENDENT_AMBULATORY_CARE_PROVIDER_SITE_OTHER)

## 2023-04-19 VITALS — BP 141/85 | HR 64 | Temp 97.8°F | Resp 18 | Ht 64.0 in | Wt 179.1 lb

## 2023-04-19 DIAGNOSIS — R1012 Left upper quadrant pain: Secondary | ICD-10-CM | POA: Diagnosis not present

## 2023-04-19 DIAGNOSIS — R160 Hepatomegaly, not elsewhere classified: Secondary | ICD-10-CM

## 2023-04-19 DIAGNOSIS — Z7689 Persons encountering health services in other specified circumstances: Secondary | ICD-10-CM | POA: Diagnosis not present

## 2023-04-19 DIAGNOSIS — R1112 Projectile vomiting: Secondary | ICD-10-CM | POA: Diagnosis not present

## 2023-04-19 MED ORDER — IOHEXOL 300 MG/ML  SOLN
100.0000 mL | Freq: Once | INTRAMUSCULAR | Status: AC | PRN
  Administered 2023-04-19: 100 mL via INTRAVENOUS

## 2023-04-19 NOTE — Progress Notes (Signed)
 New Patient Office Visit  Subjective    Patient ID: Ruth [name], female    DOB: 12-10-1970  Age: 53 y.o. MRN: [id number]  CC:  Chief Complaint  Patient presents with   Establish Care    Patient is here to establish care with a new PCP,    Abdominal Pain    Patient would like to discuss abdominal pain and  acid reflux that occurs at random times and has been happening for about 6 months or more     HPI Ruth [name] presents to establish care. Pt is new to me. Works for [employer]. Got new insurance and needed new PCP.   Abdominal pain Pt reports she's had some abdominal pain and vomiting for at least 6 months. She reports it may last a day or 2. She reports when she projectile vomiting. She reports it's random. She has changed her diet with eliminating dairy and tomato sauce which hasn't helped. She was diagnosed with GERD before and given Protonix and Carafate. These medicines didn't help after taking daily. She says these spells may come up once every 6 weeks. She reports it always happens after at least one meal. She says she may have an episode  She denies smoking and drinking. She denies nsaid use.  She has periods of passing gas and have intermittent heartburn. She has Protonix but not taking it everyday because she would still have these symptoms. She is up to date with colonoscopy and has this done January 2024.  Pt report the abdominal pain is always on the left upper quadrant and feels like pressure.  She was seen in ER for abdominal pain and symptoms that she attributes to taking steroids. She says that episode was different. She had xray done that showed left colonic stool and she had RUQ ultrasound that was negative for gallbladder disease but did show hepatic mass suspicious for hemangioma.  Outpatient Encounter Medications as of 04/19/2023  Medication Sig   EPINEPHrine 0.3 mg/0.3 mL IJ SOAJ injection Inject 0.3 mg into the muscle as needed for anaphylaxis.    fluticasone (FLONASE) 50 MCG/ACT nasal spray SPRAY 2 SPRAYS INTO EACH NOSTRIL EVERY DAY   ondansetron (ZOFRAN) 4 MG tablet Take 1 tablet (4 mg total) by mouth every 8 (eight) hours as needed for nausea or vomiting.   pantoprazole (PROTONIX) 40 MG tablet Take 1 tablet (40 mg total) by mouth daily.   VITAMIN D PO Take 1 capsule by mouth daily.   [DISCONTINUED] estradiol (VIVELLE-DOT) 0.05 MG/24HR patch Place 1 patch onto the skin 2 (two) times a week.   [DISCONTINUED] ondansetron (ZOFRAN-ODT) 4 MG disintegrating tablet Take 1 tablet (4 mg total) by mouth every 8 (eight) hours as needed for nausea or vomiting.   [DISCONTINUED] progesterone (PROMETRIUM) 100 MG capsule Take 100 mg by mouth at bedtime.   [DISCONTINUED] sucralfate (CARAFATE) 1 g tablet Take 1 tablet (1 g total) by mouth 4 (four) times daily -  with meals and at bedtime.   No facility-administered encounter medications on file as of 04/19/2023.    Past Medical History:  Diagnosis Date   Allergy    Arthritis    Asthma    GERD (gastroesophageal reflux disease)    History of abdominal pain    2007- mild decreased GB EF; 2008-collapsing R ovarian cyst; 2012 uterine fibroid x 1.   HV (hallux valgus)    repaired as child   Hyperlipidemia    Prediabetes     Past  Surgical History:  Procedure Laterality Date   BUNIONECTOMY Right    performed as child   US TRANSVAGINAL COMPLETE  09/17/2019    Family History  Problem Relation Age of Onset   Hypertension Mother    Alcohol abuse Father    Glaucoma Maternal Grandmother    Asthma Maternal Grandfather    Cancer Paternal Grandfather        lung   Colon cancer Neg Hx    Colon polyps Neg Hx    Esophageal cancer Neg Hx    Rectal cancer Neg Hx    Stomach cancer Neg Hx     Social History   Socioeconomic History   Marital status: Single    Spouse name: Not on file   Number of children: 0   Years of education: Not on file   Highest education level: Master's degree (e.g., MA,  MS, MEng, MEd, MSW, MBA)  Occupational History   Occupation: Runner, broadcasting/film/video    Comment: HS, basketball coach  Tobacco Use   Smoking status: Never   Smokeless tobacco: Never  Vaping Use   Vaping status: Never Used  Substance and Sexual Activity   Alcohol use: No    Comment: occasional   Drug use: No   Sexual activity: Yes  Other Topics Concern   Not on file  Social History Narrative   ** Merged History Encounter **       Ms Muckle works Teacher, 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    Social Drivers of Corporate investment banker Strain: Low Risk  (04/17/2023)   Overall Financial Resource Strain (CARDIA)    Difficulty of Paying Living Expenses: Not very hard  Food Insecurity: No Food Insecurity (04/17/2023)   Hunger Vital Sign    Worried About Running Out of Food in the Last Year: Never true    Ran Out of Food in the Last Year: Never true  Transportation Needs: No Transportation Needs (04/17/2023)   PRAPARE - Administrator, Civil Service (Medical): No    Lack of Transportation (Non-Medical): No  Physical Activity: Sufficiently Active (04/17/2023)   Exercise Vital Sign    Days of Exercise per Week: 5 days    Minutes of Exercise per Session: 30 min  Stress: No Stress Concern Present (04/17/2023)   [employer] of Occupational Health - Occupational Stress Questionnaire    Feeling of Stress : Only a little  Social Connections: Moderately Integrated (04/17/2023)   Social Connection and Isolation Panel [NHANES]    Frequency of Communication with Friends and Family: More than three times a week    Frequency of Social Gatherings with Friends and Family: More than three times a week    Attends Religious Services: More than 4 times per year    Active Member of [employer] or Organizations: Yes    Attends Banker Meetings: More than 4 times per year    Marital Status: Never married  Intimate Partner Violence: Unknown (01/11/2022)   Received from [facility], [facility]   HITS     Physically Hurt: Not on file    Insult or Talk Down To: Not on file    Threaten Physical Harm: Not on file    Scream or Curse: Not on file    Review of Systems  Gastrointestinal:  Positive for abdominal pain, heartburn, nausea and vomiting. Negative for blood in stool, constipation, diarrhea and melena.  All other systems reviewed and are negative.      Objective  BP (!) 141/85   Pulse 64   Temp 97.8 F (36.6 C) (Oral)   Resp 18   Ht 5\' 4"  (1.626 m)   Wt 179 lb 1.6 oz (81.2 kg)   LMP 01/23/2018   SpO2 100%   BMI 30.74 kg/m   Physical Exam Vitals and nursing note reviewed.  Constitutional:      Appearance: Normal appearance. She is normal weight.  HENT:     Head: Normocephalic and atraumatic.     Right Ear: External ear normal.     Left Ear: External ear normal.     Nose: Nose normal.     Mouth/Throat:     Mouth: Mucous membranes are moist.     Pharynx: Oropharynx is clear.  Eyes:     Conjunctiva/sclera: Conjunctivae normal.     Pupils: Pupils are equal, round, and reactive to light.  Cardiovascular:     Rate and Rhythm: Normal rate and regular rhythm.     Pulses: Normal pulses.     Heart sounds: Normal heart sounds.  Pulmonary:     Effort: Pulmonary effort is normal.     Breath sounds: Normal breath sounds.  Abdominal:     General: Abdomen is flat. Bowel sounds are normal. There is no distension.     Palpations: There is no mass.     Tenderness: There is no abdominal tenderness. There is no guarding or rebound.  Skin:    General: Skin is warm.     Capillary Refill: Capillary refill takes less than 2 seconds.  Neurological:     General: No focal deficit present.     Mental Status: She is alert and oriented to person, place, and time. Mental status is at baseline.  Psychiatric:        Mood and Affect: Mood normal.        Behavior: Behavior normal.        Thought Content: Thought content normal.        Judgment: Judgment normal.      Assessment &  Plan:   Problem List Items Addressed This Visit   None  Encounter to establish care with new doctor  Left upper quadrant abdominal pain -     H. pylori breath test -     Comprehensive metabolic panel with GFR -     Lipase -     Amylase -     CT ABDOMEN PELVIS W CONTRAST; Future  Projectile vomiting with nausea -     H. pylori breath test -     Comprehensive metabolic panel with GFR -     Lipase -     Amylase -     CT ABDOMEN PELVIS W CONTRAST; Future  Liver mass, right lobe  Pt with 6 month hx of left quadrant abdominal pain. Will screen amylase and lipase along with H pylori. Send for CT scan abdomen with contrast. If negative, advised pt that I would refer her to GI for further evaluation and rule out possible ulcer, hiatal hernia? No follow-ups on file.   Manette Section, MD

## 2023-04-20 ENCOUNTER — Encounter: Payer: Self-pay | Admitting: Family Medicine

## 2023-04-20 LAB — COMPREHENSIVE METABOLIC PANEL WITH GFR
ALT: 12 IU/L (ref 0–32)
AST: 14 IU/L (ref 0–40)
Albumin: 4.5 g/dL (ref 3.8–4.9)
Alkaline Phosphatase: 112 IU/L (ref 44–121)
BUN/Creatinine Ratio: 14 (ref 9–23)
BUN: 15 mg/dL (ref 6–24)
Bilirubin Total: <0.2 mg/dL (ref 0.0–1.2)
CO2: 23 mmol/L (ref 20–29)
Calcium: 9.8 mg/dL (ref 8.7–10.2)
Chloride: 102 mmol/L (ref 96–106)
Creatinine, Ser: 1.04 mg/dL — ABNORMAL HIGH (ref 0.57–1.00)
Globulin, Total: 2.5 g/dL (ref 1.5–4.5)
Glucose: 82 mg/dL (ref 70–99)
Potassium: 4.5 mmol/L (ref 3.5–5.2)
Sodium: 140 mmol/L (ref 134–144)
Total Protein: 7 g/dL (ref 6.0–8.5)
eGFR: 64 mL/min/{1.73_m2} (ref >59)

## 2023-04-20 LAB — LIPASE: Lipase: 23 U/L (ref 14–72)

## 2023-04-20 LAB — H. PYLORI BREATH TEST: H pylori Breath Test: NEGATIVE

## 2023-04-20 LAB — AMYLASE: Amylase: 65 U/L (ref 31–110)

## 2023-09-06 ENCOUNTER — Encounter: Payer: Self-pay | Admitting: Sports Medicine

## 2023-10-21 ENCOUNTER — Ambulatory Visit: Payer: Self-pay

## 2023-10-21 NOTE — Telephone Encounter (Signed)
 FYI Only or Action Required?: FYI only for provider.  Patient was last seen in primary care on 04/19/2023 by Colette Torrence GRADE, MD.  Called Nurse Triage reporting Pain.  Symptoms began about a month ago.  Interventions attempted: OTC medications: ibuprofen, Rest, hydration, or home remedies, and Ice/heat application.  Symptoms are: gradually worsening.  Triage Disposition: See PCP When Office is Open (Within 3 Days)  Patient/caregiver understands and will follow disposition?: Yes        Copied from CRM [id number]. Topic: Clinical - Red Word Triage >> Oct 21, 2023  9:45 AM Joesph NOVAK wrote: Red Word that prompted transfer to Nurse Triage: Patient has carpal tunnel, numbness in her left hand. Pain. Reason for Disposition  [1] Weakness or numbness in hand or fingers AND [2] present > 2 weeks  Answer Assessment - Initial Assessment Questions Pt states about a month ago it was her right hand/wrist so she wore a brace and it improved. Lately it has been her left hand/wrist. Her left pinky feels tingly and has some pain. She states she was told she has come degenerative discs in her back and it could cause the numbness in either or both arms. She states typically its worse in the mornings and then subsides but it hasn't been doing that. It has been lasting longer throughout the day. Also all of her typical tricks aren't working such as sleeping with a brace, ibuprofen, heat, etc.    1. ONSET: When did the pain start?     Earlier this month started to get worse 2. LOCATION: Where is the pain located?     Both wrists left worse today 3. PAIN: How bad is the pain? (Scale 1-10; or mild, moderate, severe)   - MILD (1-3): doesn't interfere with normal activities   - MODERATE (4-7): interferes with normal activities (e.g., work or school) or awakens from sleep   - SEVERE (8-10): excruciating pain, unable to use hand at all     4-5 4. WORK OR EXERCISE: Has there been any recent work or  exercise that involved this part (i.e., hand or wrist) of the body?     typing 5. CAUSE: What do you think is causing the pain?     Could be carpal tunnel or degenerative disc disease 6. AGGRAVATING FACTORS: What makes the pain worse? (e.g., using computer)      7. OTHER SYMPTOMS: Do you have any other symptoms? (e.g., neck pain, swelling, rash, numbness, fever)     No swelling, numbness and pain in her pinky.  Protocols used: Hand and Wrist Pain-A-AH

## 2023-10-24 ENCOUNTER — Ambulatory Visit: Admitting: Family Medicine

## 2023-10-24 ENCOUNTER — Encounter: Payer: Self-pay | Admitting: Family Medicine

## 2023-10-24 VITALS — BP 159/83 | HR 63 | Temp 97.7°F | Ht 64.0 in | Wt 179.0 lb

## 2023-10-24 DIAGNOSIS — G5601 Carpal tunnel syndrome, right upper limb: Secondary | ICD-10-CM

## 2023-10-24 DIAGNOSIS — R202 Paresthesia of skin: Secondary | ICD-10-CM | POA: Diagnosis not present

## 2023-10-24 DIAGNOSIS — R030 Elevated blood-pressure reading, without diagnosis of hypertension: Secondary | ICD-10-CM | POA: Insufficient documentation

## 2023-10-24 NOTE — Assessment & Plan Note (Signed)
 Blood pressure elevated in office today. Not improved upon recheck. She will monitor her blood pressure at home, return in 1 week with blood pressure log, likely will need to start on blood pressure medication. DASH diet with moderate exercise recommended. Follow-up in 1 week.

## 2023-10-24 NOTE — Assessment & Plan Note (Signed)
 This is a chronic condition, symptoms have been managed with injections per sports medicine. Sports medicine provider no longer with practice, referral placed for orthopedics for ongoing care.

## 2023-10-24 NOTE — Progress Notes (Signed)
 Acute Office Visit  Subjective:     Patient ID: Ruth [name], female    DOB: [birth year], 53 y.o.   MRN: [id number]  Chief Complaint  Patient presents with   Carpal Tunnel    Going back and forth between right and left hand. Going on since June/July/August. Does exercises and uses a splint. Has mainly been the right hand. Went away and woke up in mid -Oct. And my left had was tingling. Put sleeve on left hand and it has progressively gotten worse. My orthopedic doctor is no longer available. Decided to come back here. Over the weekend the tingling is back in the right hand too.    HPI Patient is in today for carpal tunnel symptoms which has been going on for years. Has had injections per Dr. ONEIDA who is no longer with the practice.  Last injection in  right hand 04/2022. Injections usually last for one year. When flares occur she uses brace.  Now symptoms are in left hand. Left hand symptoms are worse than right has ever been.  Left hand symptoms started July or August.  Wearing splint on left wrist/hand today.  Reports tingling in two fingers.  No trauma. Does not perform repetitive  motions throughout the day. Symptoms are affecting her physical activity.  Ibuprofen helps.  Heating pad. Finding it hard to rest it.  Sleeps with brace and this helps her to sleep.     ROS      Objective:    BP (!) 159/83 (BP Location: Left Arm, Patient Position: Sitting, Cuff Size: Normal)   Pulse 63   Temp 97.7 F (36.5 C) (Oral)   Ht 5' 4 (1.626 m)   Wt 179 lb (81.2 kg)   LMP 01/23/2018   SpO2 98%   BMI 30.73 kg/m    Physical Exam Vitals and nursing note reviewed.  Constitutional:      General: She is not in acute distress.    Appearance: Normal appearance.  Cardiovascular:     Rate and Rhythm: Normal rate and regular rhythm.     Heart sounds: Normal heart sounds.  Pulmonary:     Effort: Pulmonary effort is normal.     Breath sounds: Normal breath sounds.   Musculoskeletal:     Comments: Positive Phalen's and Tinels signs on right and left.  Hand  grips 5/5 bilaterally.    Skin:    General: Skin is warm and dry.  Neurological:     General: No focal deficit present.     Mental Status: She is alert. Mental status is at baseline.  Psychiatric:        Mood and Affect: Mood normal.        Behavior: Behavior normal.        Thought Content: Thought content normal.        Judgment: Judgment normal.     No results found for any visits on 10/24/23.      Assessment & Plan:   Problem List Items Addressed This Visit     Carpal tunnel syndrome, right - Primary   This is a chronic condition, symptoms have been managed with injections per sports medicine. Sports medicine provider no longer with practice, referral placed for orthopedics for ongoing care.      Relevant Orders   Ambulatory referral to Orthopedics   Hand tingling   Known carpal tunnel syndrome on the right. Has been treated per sports medicine with injections on right. Left hand tingling have been  present for several months.  Wearing brace on left hand in office today. Phalen's and Tinel's signs are positive. Urgent referral to orthopedics for evaluation of possible carpal tunnel syndrome on the left. Follow-up with PCP as needed.      Relevant Orders   Ambulatory referral to Orthopedics   Elevated blood pressure reading in office without diagnosis of hypertension   Blood pressure elevated in office today. Not improved upon recheck. She will monitor her blood pressure at home, return in 1 week with blood pressure log, likely will need to start on blood pressure medication. DASH diet with moderate exercise recommended. Follow-up in 1 week.     Agrees with plan of care discussed.  Questions answered.      Return in 1 week (on 10/31/2023) for blood pressure .  Darice JONELLE Brownie, FNP

## 2023-10-24 NOTE — Assessment & Plan Note (Addendum)
 Known carpal tunnel syndrome on the right. Has been treated per sports medicine with injections on right. Left hand tingling have been present for several months.  Wearing brace on left hand in office today. Phalen's and Tinel's signs are positive. Urgent referral to orthopedics for evaluation of possible carpal tunnel syndrome on the left. Follow-up with PCP as needed.

## 2023-11-03 ENCOUNTER — Ambulatory Visit: Admitting: Orthopaedic Surgery

## 2023-11-03 DIAGNOSIS — G5601 Carpal tunnel syndrome, right upper limb: Secondary | ICD-10-CM | POA: Diagnosis not present

## 2023-11-03 DIAGNOSIS — G5602 Carpal tunnel syndrome, left upper limb: Secondary | ICD-10-CM

## 2023-11-03 MED ORDER — NAPROXEN 500 MG PO TABS: 500.0000 mg | ORAL_TABLET | Freq: Two times a day (BID) | ORAL | 3 refills | Status: AC

## 2023-11-03 NOTE — Progress Notes (Signed)
 Office Visit Note   Patient: Ruth [name]           Date of Birth: 12-05-1970           MRN: [id number] Visit Date: 11/03/2023              Requested by: Booker Darice SAUNDERS, FNP [address] [place],  [place] [address] PCP: Colette Torrence GRADE, MD   Assessment & Plan: Visit Diagnoses:  1. Right carpal tunnel syndrome   2. Left carpal tunnel syndrome     Plan: History of Present Illness Ruth [name] is a 53 year old female who presents with worsening symptoms of carpal tunnel syndrome in both hands. She was referred by her primary care physician for the diagnosis of carpal tunnel syndrome.  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 Symptoms have persisted for about a month, with tingling and numbness primarily in the ring and small fingers on the left hand. The sensation worsens with certain movements or when wearing a splint.  Her treatment history includes cortisone injections, with one injection in the left hand in 2018 and three in the right hand over the past five years, the most recent in 2024. These injections have previously provided relief, especially in the left hand.  Ibuprofen provides temporary relief for about an hour. Prednisone  was not well tolerated due to side effects, and gabapentin  causes significant drowsiness. No numbness is present in the thumb, index, or middle fingers. She has not undergone nerve studies before.  Physical Exam MUSCULOSKELETAL: Positive Tinel's sign on left hand. Ulnar nerve stable at the cubital tunnel with elbow range of motion.  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 Differential diagnosis includes carpal tunnel syndrome and cubital tunnel  syndrome. Previous cortisone injection provided symptom relief, but the exact diagnosis remains unclear. - Order nerve conduction studies to differentiate between carpal and cubital tunnel syndrome. - Prescribe Naprosyn for pain relief, advise taking with food.  Right hand carpal tunnel syndrome Right hand carpal tunnel syndrome with previous cortisone injections providing relief. Current symptoms are less severe than the left hand. - Consider further intervention if symptoms worsen.  Follow-Up Instructions: No follow-ups on file.   Orders:  Orders Placed This Encounter  Procedures  . Ambulatory referral to Physical Medicine Rehab   Meds ordered this encounter  Medications  . naproxen (NAPROSYN) 500 MG tablet    Sig: Take 1 tablet (500 mg total) by mouth 2 (two) times daily with a meal.    Dispense:  30 tablet    Refill:  3      Procedures: No procedures performed   Clinical Data: No additional findings.   Subjective: Chief Complaint  Patient presents with  . Right Hand - Pain  . Left Hand - Pain    HPI  Review of Systems  Constitutional: Negative.   HENT: Negative.    Eyes: Negative.   Respiratory: Negative.    Cardiovascular: Negative.   Endocrine: Negative.   Musculoskeletal: Negative.   Neurological: Negative.   Hematological: Negative.   Psychiatric/Behavioral: Negative.    All other systems reviewed and are negative.    Objective: Vital Signs: LMP 01/23/2018   Physical Exam Vitals and nursing note reviewed.  Constitutional:      Appearance: She is well-developed.  HENT:     Head: Atraumatic.     Nose: Nose normal.  Eyes:     Extraocular Movements: Extraocular movements intact.  Cardiovascular:     Pulses: Normal pulses.  Pulmonary:     Effort: Pulmonary effort is normal.  Abdominal:     Palpations: Abdomen is soft.  Musculoskeletal:     Cervical back: Neck supple.  Skin:    General: Skin is warm.     Capillary Refill: Capillary  refill takes less than 2 seconds.  Neurological:     Mental Status: She is alert. Mental status is at baseline.  Psychiatric:        Behavior: Behavior normal.        Thought Content: Thought content normal.        Judgment: Judgment normal.     Ortho Exam  Specialty Comments:  No specialty comments available.  Imaging: No results found.   PMFS History: Patient Active Problem List   Diagnosis Date Noted  . Hand tingling 10/24/2023  . Elevated blood pressure reading in office without diagnosis of hypertension 10/24/2023  . DDD (degenerative disc disease), cervical 02/26/2022  . Primary osteoarthritis of first carpometacarpal joint of one hand, left 08/04/2021  . Other allergic rhinitis 06/24/2020  . Food allergy 05/01/2020  . Eustachian tube dysfunction, bilateral 11/12/2016  . Lateral epicondylitis of left elbow 11/12/2016  . Right wrist pain 11/12/2016  . Thyromegaly 05/12/2016  . Perimenopause 05/12/2016  . Thyroid  nodule-bx benign 05/12/2016  . Carpal tunnel syndrome, right 04/06/2016  . Hyperlipidemia LDL goal <130 06/30/2015  . GERD (gastroesophageal reflux disease) 06/16/2015  . Hot flashes 06/11/2014  . Vitamin D  deficiency 05/10/2014   Past Medical History:  Diagnosis Date  . Allergy 91988015  . Arthritis   . Asthma   . GERD (gastroesophageal reflux disease)   . History of abdominal pain    2007- mild decreased GB EF; 2008-collapsing R ovarian cyst; 2012 uterine fibroid x 1.  . HV (hallux valgus)    repaired as child  . Hyperlipidemia   . Prediabetes     Family History  Problem Relation Age of Onset  . Hypertension Mother   . Alcohol abuse Father   . Glaucoma Maternal Grandmother   . Asthma Maternal Grandfather   . Cancer Paternal Grandfather        lung  . Colon cancer Neg Hx   . Colon polyps Neg Hx   . Esophageal cancer Neg Hx   . Rectal cancer Neg Hx   . Stomach cancer Neg Hx     Past Surgical History:  Procedure Laterality Date  .  BUNIONECTOMY Right    performed as child  . US  TRANSVAGINAL COMPLETE  09/17/2019   Social History   Occupational History  . Occupation: runner, broadcasting/film/video    Comment: HS, basketball coach  Tobacco Use  . Smoking status: Never  . Smokeless tobacco: Never  . Tobacco comments:    N/A  Vaping Use  . Vaping status: Never Used  Substance and Sexual Activity  . Alcohol use: No    Comment: occasional  . Drug use: No  . Sexual activity: Not Currently    Birth control/protection: Abstinence

## 2023-11-07 ENCOUNTER — Encounter: Payer: Self-pay | Admitting: Radiology

## 2023-11-08 ENCOUNTER — Ambulatory Visit: Admitting: Family Medicine

## 2023-11-16 ENCOUNTER — Ambulatory Visit: Admitting: Physical Medicine and Rehabilitation

## 2023-11-16 DIAGNOSIS — R202 Paresthesia of skin: Secondary | ICD-10-CM | POA: Diagnosis not present

## 2023-11-16 NOTE — Progress Notes (Signed)
 Pain Scale   Average Pain 2 Patient advising she has pain, numbness/tingling in her hands however her left is actually the hand that is she is having more problems with now. Patient is Right hand dominate         +Driver, -BT, -Dye Allergies.

## 2023-11-17 NOTE — Procedures (Signed)
 EMG & NCV Findings: Evaluation of the right median motor nerve showed prolonged distal onset latency (4.3 ms).  The left median (across palm) sensory and the right median (across palm) sensory nerves showed prolonged distal peak latency (Wrist, L3.7, R4.4 ms).  All remaining nerves (as indicated in the following tables) were within normal limits.  Left vs. Right side comparison data for the ulnar motor nerve indicates abnormal L-R amplitude difference (31.4 %) and abnormal L-R velocity difference (A Elbow-B Elbow, 23 m/s).  All remaining left vs. right side differences were within normal limits.    All examined muscles (as indicated in the following table) showed no evidence of electrical instability.    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   a mild left median nerve entrapment at the wrist (carpal tunnel syndrome) affecting sensory components.   There is no significant electrodiagnostic evidence of any other focal nerve entrapment, brachial plexopathy or cervical radiculopathy.    Recommendations: 1.  Follow-up with referring physician. 2.  Continue current management of symptoms. 3.  Continue use of resting splint at night-time and as needed during the day.  ___________________________ Prentice Eldonna BETTERS Board Certified, American Board of Physical Medicine and Rehabilitation    Nerve Conduction Studies Anti Sensory Summary Table   Stim Site NR Peak (ms) Norm Peak (ms) P-T Amp (V) Norm P-T Amp Site1 Site2 Delta-P (ms) Dist (cm) Vel (m/s) Norm Vel (m/s)  Left Median Acr Palm Anti Sensory (2nd Digit)  30.5C  Wrist    *3.7 <3.6 35.4 >10 Wrist Palm 1.9 0.0    Palm    1.8 <2.0 27.5         Right Median Acr Palm Anti Sensory (2nd Digit)  29.8C  Wrist    *4.4 <3.6 21.7 >10 Wrist Palm 2.6 0.0    Palm    1.8 <2.0 20.3          Left Radial Anti Sensory (Base 1st Digit)  30C  Wrist    1.8 <3.1 43.3  Wrist Base 1st Digit 1.8 0.0    Right Radial Anti Sensory (Base 1st Digit)  31C  Wrist    1.9 <3.1 29.9  Wrist Base 1st Digit 1.9 0.0    Left Ulnar Anti Sensory (5th Digit)  30.7C  Wrist    3.0 <3.7 41.7 >15.0 Wrist 5th Digit 3.0 14.0 47 >38  Right Ulnar Anti Sensory (5th Digit)  30.8C  Wrist    2.9 <3.7 25.3 >15.0 Wrist 5th Digit 2.9 14.0 48 >38   Motor Summary Table   Stim Site NR Onset (ms) Norm Onset (ms) O-P Amp (mV) Norm O-P Amp Site1 Site2 Delta-0 (ms) Dist (cm) Vel (m/s) Norm Vel (m/s)  Left Median Motor (Abd Poll Brev)  30.4C  Wrist    3.8 <4.2 11.0 >5 Elbow Wrist 3.4 19.0 56 >50  Elbow    7.2  10.8         Right Median Motor (Abd Poll Brev)  31C  Wrist    *4.3 <4.2 8.2 >5 Elbow Wrist 3.6 19.0 53 >50  Elbow    7.9  7.9         Left Ulnar Motor (Abd Dig Min)  30.6C  Wrist    2.7 <4.2 7.0 >3 B Elbow Wrist 2.9 16.5 57 >53  B Elbow    5.6  6.8  A  Elbow B Elbow 1.3 10.0 77 >53  A Elbow    6.9  6.7         Right Ulnar Motor (Abd Dig Min)  31.3C  Wrist    2.4 <4.2 10.2 >3 B Elbow Wrist 3.1 17.5 56 >53  B Elbow    5.5  10.3  A Elbow B Elbow 1.0 10.0 100 >53  A Elbow    6.5  10.4          EMG   Side Muscle Nerve Root Ins Act Fibs Psw Amp Dur Poly Recrt Int Bruna Comment  Left Abd Poll Brev Median C8-T1 Nml Nml Nml Nml Nml 0 Nml Nml   Left 1stDorInt Ulnar C8-T1 Nml Nml Nml Nml Nml 0 Nml Nml   Left PronatorTeres Median C6-7 Nml Nml Nml Nml Nml 0 Nml Nml   Left Biceps Musculocut C5-6 Nml Nml Nml Nml Nml 0 Nml Nml   Left Deltoid Axillary C5-6 Nml Nml Nml Nml Nml 0 Nml Nml     Nerve Conduction Studies Anti Sensory Left/Right Comparison   Stim Site L Lat (ms) R Lat (ms) L-R Lat (ms) L Amp (V) R Amp (V) L-R Amp (%) Site1 Site2 L Vel (m/s) R Vel (m/s) L-R Vel (m/s)  Median Acr Palm Anti Sensory (2nd Digit)  30.5C  Wrist *3.7 *4.4 0.7 35.4 21.7 38.7 Wrist Palm     Palm 1.8 1.8 0.0 27.5 20.3 26.2        Radial Anti Sensory (Base 1st Digit)  30C  Wrist 1.8 1.9 0.1 43.3 29.9 30.9 Wrist Base 1st Digit     Ulnar Anti Sensory (5th Digit)  30.7C  Wrist 3.0 2.9 0.1 41.7 25.3 39.3 Wrist 5th Digit 47 48 1   Motor Left/Right Comparison   Stim Site L Lat (ms) R Lat (ms) L-R Lat (ms) L Amp (mV) R Amp (mV) L-R Amp (%) Site1 Site2 L Vel (m/s) R Vel (m/s) L-R Vel (m/s)  Median Motor (Abd Poll Brev)  30.4C  Wrist 3.8 *4.3 0.5 11.0 8.2 25.5 Elbow Wrist 56 53 3  Elbow 7.2 7.9 0.7 10.8 7.9 26.9       Ulnar Motor (Abd Dig Min)  30.6C  Wrist 2.7 2.4 0.3 7.0 10.2 *31.4 B Elbow Wrist 57 56 1  B Elbow 5.6 5.5 0.1 6.8 10.3 34.0 A Elbow B Elbow 77 100 *23  A Elbow 6.9 6.5 0.4 6.7 10.4 35.6          Waveforms:

## 2023-11-23 ENCOUNTER — Ambulatory Visit: Admitting: Orthopaedic Surgery

## 2023-11-23 DIAGNOSIS — G5602 Carpal tunnel syndrome, left upper limb: Secondary | ICD-10-CM | POA: Insufficient documentation

## 2023-11-23 DIAGNOSIS — G5601 Carpal tunnel syndrome, right upper limb: Secondary | ICD-10-CM

## 2023-11-23 MED ORDER — METHYLPREDNISOLONE ACETATE 40 MG/ML IJ SUSP
40.0000 mg | INTRAMUSCULAR | Status: AC | PRN
  Administered 2023-11-23: 40 mg

## 2023-11-23 MED ORDER — LIDOCAINE HCL 1 % IJ SOLN
1.0000 mL | INTRAMUSCULAR | Status: AC | PRN
  Administered 2023-11-23: 1 mL

## 2023-11-23 MED ORDER — BUPIVACAINE HCL 0.5 % IJ SOLN
1.0000 mL | INTRAMUSCULAR | Status: AC | PRN
  Administered 2023-11-23: 1 mL

## 2023-11-23 NOTE — Progress Notes (Signed)
 Office Visit Note   Patient: Ruth [name]           Date of Birth: July 23, 1970           MRN: [id number] Visit Date: 11/23/2023              Requested by: Colette Torrence GRADE, MD [address] [place],  [place] [address] PCP: Colette Torrence GRADE, MD   Assessment & Plan: Visit Diagnoses:  1. Right carpal tunnel syndrome   2. Left carpal tunnel syndrome     Plan: History of Present Illness Ruth [name] is a 53 year old female who presents for follow-up on nerve conduction studies. Referred by Dr. Eldonna for evaluation of nerve conduction study results.  She experiences tingling and radiating pain in her left hand, extending to the back of her arm. The primary discomfort is consistently located in her left hand. She is left-handed and is concerned about the impact of potential surgical interventions on her dominant hand.  Results DIAGNOSTIC Nerve conduction study: Mild left CTS and mild to moderate right CTS  Assessment and Plan Bilateral carpal tunnel syndrome NCV/EMG results reviewed and long discussion was had on treatment options.   - Continue nighttime splinting. - Administered cortisone injection in the left hand. - right hand symptoms are currently manageable  Follow-Up Instructions: No follow-ups on file.   Orders:  No orders of the defined types were placed in this encounter.  No orders of the defined types were placed in this encounter.     Procedures: Hand/UE Inj: L carpal tunnel for carpal tunnel syndrome on 11/23/2023 9:00 AM Details: 25 G needle Medications: 1 mL lidocaine 1 %; 1 mL bupivacaine 0.5 %; 40 mg methylPREDNISolone  acetate 40 MG/ML Outcome: tolerated well, no immediate complications Patient was prepped and draped in the usual sterile fashion.       Clinical Data: No additional findings.   Subjective: Chief Complaint  Patient presents with   Right Hand - Pain    HPI  Review of Systems  Constitutional: Negative.   HENT:  Negative.    Eyes: Negative.   Respiratory: Negative.    Cardiovascular: Negative.   Endocrine: Negative.   Musculoskeletal: Negative.   Neurological: Negative.   Hematological: Negative.   Psychiatric/Behavioral: Negative.    All other systems reviewed and are negative.    Objective: Vital Signs: LMP 01/23/2018   Physical Exam Vitals and nursing note reviewed.  Constitutional:      Appearance: She is well-developed.  HENT:     Head: Atraumatic.     Nose: Nose normal.  Eyes:     Extraocular Movements: Extraocular movements intact.  Cardiovascular:     Pulses: Normal pulses.  Pulmonary:     Effort: Pulmonary effort is normal.  Abdominal:     Palpations: Abdomen is soft.  Musculoskeletal:     Cervical back: Neck supple.  Skin:    General: Skin is warm.     Capillary Refill: Capillary refill takes less than 2 seconds.  Neurological:     Mental Status: She is alert. Mental status is at baseline.  Psychiatric:        Behavior: Behavior normal.        Thought Content: Thought content normal.        Judgment: Judgment normal.     Ortho Exam  Specialty Comments:  No specialty comments available.  Imaging: No results found.   PMFS History: Patient Active Problem List   Diagnosis Date  Noted   Left carpal tunnel syndrome 11/23/2023   Hand tingling 10/24/2023   Elevated blood pressure reading in office without diagnosis of hypertension 10/24/2023   DDD (degenerative disc disease), cervical 02/26/2022   Primary osteoarthritis of first carpometacarpal joint of one hand, left 08/04/2021   Other allergic rhinitis 06/24/2020   Food allergy 05/01/2020   Eustachian tube dysfunction, bilateral 11/12/2016   Lateral epicondylitis of left elbow 11/12/2016   Right wrist pain 11/12/2016   Thyromegaly 05/12/2016   Perimenopause 05/12/2016   Thyroid  nodule-bx benign 05/12/2016   Right carpal tunnel syndrome 04/06/2016   Hyperlipidemia LDL goal <130 06/30/2015   GERD  (gastroesophageal reflux disease) 06/16/2015   Hot flashes 06/11/2014   Vitamin D  deficiency 05/10/2014   Past Medical History:  Diagnosis Date   Allergy 91988015   Arthritis    Asthma    GERD (gastroesophageal reflux disease)    History of abdominal pain    2007- mild decreased GB EF; 2008-collapsing R ovarian cyst; 2012 uterine fibroid x 1.   HV (hallux valgus)    repaired as child   Hyperlipidemia    Prediabetes     Family History  Problem Relation Age of Onset   Hypertension Mother    Alcohol abuse Father    Glaucoma Maternal Grandmother    Asthma Maternal Grandfather    Cancer Paternal Grandfather        lung   Colon cancer Neg Hx    Colon polyps Neg Hx    Esophageal cancer Neg Hx    Rectal cancer Neg Hx    Stomach cancer Neg Hx     Past Surgical History:  Procedure Laterality Date   BUNIONECTOMY Right    performed as child   US  TRANSVAGINAL COMPLETE  09/17/2019   Social History   Occupational History   Occupation: runner, broadcasting/film/video    Comment: HS, basketball coach  Tobacco Use   Smoking status: Never   Smokeless tobacco: Never   Tobacco comments:    N/A  Vaping Use   Vaping status: Never Used  Substance and Sexual Activity   Alcohol use: No    Comment: occasional   Drug use: No   Sexual activity: Not Currently    Birth control/protection: Abstinence

## 2023-11-26 NOTE — Progress Notes (Signed)
 Ruth [name] - 53 y.o. female MRN [id number]  Date of birth: 12-Apr-1970  Office Visit Note: Visit Date: 11/16/2023 PCP: Colette Torrence GRADE, MD Referred by: Jerri Kay HERO, MD  Subjective: Chief Complaint  Patient presents with   Right Hand - Numbness, Weakness   Left Hand - Pain, Numbness, Weakness   HPI:  Ruth [name] is a 53 y.o. female who comes in today at the request of Dr. Ozell Jerri for evaluation and management of chronic, worsening and severe pain, numbness and tingling in the Bilateral upper extremities.  Patient is Right hand dominant.  She reports a chronic history but several months of really worsening left more than right pain numbness and tingling.  Symptoms are somewhat global but may be more radial digits.  Has had some nocturnal complaints.  The left hand is really bothering her quite a bit at this point.  She feels like maybe the right hand started first but it is really not as big of an issue is the left hand.  She does endorse weakness at times.  She has tried medications in time and splinting with no relief.  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    ROS Otherwise per HPI.  Assessment & Plan: Visit Diagnoses:    ICD-10-CM   1. 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   a mild left median nerve entrapment at the wrist (carpal tunnel syndrome) affecting sensory components.   There is no significant electrodiagnostic evidence of any other focal nerve entrapment, brachial plexopathy or cervical radiculopathy.    Recommendations: 1.  Follow-up with referring physician. 2.  Continue current management of symptoms. 3.  Continue  use of resting splint at night-time and as needed during the day.  Meds & Orders: No orders of the defined types were placed in this encounter.   Orders Placed This Encounter  Procedures   NCV with EMG (electromyography)    Follow-up: Return for  Ozell Jerri, MD.   Procedures: No procedures performed  EMG & NCV Findings: Evaluation of the right median motor nerve showed prolonged distal onset latency (4.3 ms).  The left median (across palm) sensory and the right median (across palm) sensory nerves showed prolonged distal peak latency (Wrist, L3.7, R4.4 ms).  All remaining nerves (as indicated in the following tables) were within normal limits.  Left vs. Right side comparison data for the ulnar motor nerve indicates abnormal L-R amplitude difference (31.4 %) and abnormal L-R velocity difference (A Elbow-B Elbow, 23 m/s).  All remaining left vs. right side differences were within normal limits.    All examined muscles (as indicated in the following table) showed no evidence of electrical instability.    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   a mild left median nerve entrapment at the wrist (carpal tunnel syndrome) affecting sensory components.   There is no significant electrodiagnostic evidence of any other focal nerve entrapment, brachial plexopathy or cervical radiculopathy.    Recommendations: 1.  Follow-up with referring physician. 2.  Continue current management of  symptoms. 3.  Continue use of resting splint at night-time and as needed during the day.  ___________________________ Prentice Eldonna BETTERS Board Certified, American Board of Physical Medicine and Rehabilitation    Nerve Conduction Studies Anti Sensory Summary Table   Stim Site NR Peak (ms) Norm Peak (ms) P-T Amp (V) Norm P-T Amp Site1 Site2  Delta-P (ms) Dist (cm) Vel (m/s) Norm Vel (m/s)  Left Median Acr Palm Anti Sensory (2nd Digit)  30.5C  Wrist    *3.7 <3.6 35.4 >10 Wrist Palm 1.9 0.0    Palm    1.8 <2.0 27.5         Right Median Acr Palm Anti Sensory (2nd Digit)  29.8C  Wrist    *4.4 <3.6 21.7 >10 Wrist Palm 2.6 0.0    Palm    1.8 <2.0 20.3         Left Radial Anti Sensory (Base 1st Digit)  30C  Wrist    1.8 <3.1 43.3  Wrist Base 1st Digit 1.8 0.0    Right Radial Anti Sensory (Base 1st Digit)  31C  Wrist    1.9 <3.1 29.9  Wrist Base 1st Digit 1.9 0.0    Left Ulnar Anti Sensory (5th Digit)  30.7C  Wrist    3.0 <3.7 41.7 >15.0 Wrist 5th Digit 3.0 14.0 47 >38  Right Ulnar Anti Sensory (5th Digit)  30.8C  Wrist    2.9 <3.7 25.3 >15.0 Wrist 5th Digit 2.9 14.0 48 >38   Motor Summary Table   Stim Site NR Onset (ms) Norm Onset (ms) O-P Amp (mV) Norm O-P Amp Site1 Site2 Delta-0 (ms) Dist (cm) Vel (m/s) Norm Vel (m/s)  Left Median Motor (Abd Poll Brev)  30.4C  Wrist    3.8 <4.2 11.0 >5 Elbow Wrist 3.4 19.0 56 >50  Elbow    7.2  10.8         Right Median Motor (Abd Poll Brev)  31C  Wrist    *4.3 <4.2 8.2 >5 Elbow Wrist 3.6 19.0 53 >50  Elbow    7.9  7.9         Left Ulnar Motor (Abd Dig Min)  30.6C  Wrist    2.7 <4.2 7.0 >3 B Elbow Wrist 2.9 16.5 57 >53  B Elbow    5.6  6.8  A Elbow B Elbow 1.3 10.0 77 >53  A Elbow    6.9  6.7         Right Ulnar Motor (Abd Dig Min)  31.3C  Wrist    2.4 <4.2 10.2 >3 B Elbow Wrist 3.1 17.5 56 >53  B Elbow    5.5  10.3  A Elbow B Elbow 1.0 10.0 100 >53  A Elbow    6.5  10.4          EMG   Side Muscle Nerve Root Ins Act Fibs Psw Amp Dur Poly Recrt Int Bruna Comment  Left Abd Poll Brev Median C8-T1 Nml Nml Nml Nml Nml 0 Nml Nml   Left 1stDorInt Ulnar C8-T1 Nml Nml Nml Nml Nml 0 Nml Nml   Left PronatorTeres Median C6-7 Nml Nml Nml Nml Nml 0 Nml Nml   Left Biceps Musculocut C5-6 Nml Nml Nml Nml Nml 0 Nml Nml   Left Deltoid Axillary C5-6 Nml Nml Nml Nml Nml 0 Nml Nml     Nerve  Conduction Studies Anti Sensory Left/Right Comparison   Stim Site L Lat (ms) R Lat (ms) L-R Lat (ms) L Amp (  V) R Amp (V) L-R Amp (%) Site1 Site2 L Vel (m/s) R Vel (m/s) L-R Vel (m/s)  Median Acr Palm Anti Sensory (2nd Digit)  30.5C  Wrist *3.7 *4.4 0.7 35.4 21.7 38.7 Wrist Palm     Palm 1.8 1.8 0.0 27.5 20.3 26.2       Radial Anti Sensory (Base 1st Digit)  30C  Wrist 1.8 1.9 0.1 43.3 29.9 30.9 Wrist Base 1st Digit     Ulnar Anti Sensory (5th Digit)  30.7C  Wrist 3.0 2.9 0.1 41.7 25.3 39.3 Wrist 5th Digit 47 48 1   Motor Left/Right Comparison   Stim Site L Lat (ms) R Lat (ms) L-R Lat (ms) L Amp (mV) R Amp (mV) L-R Amp (%) Site1 Site2 L Vel (m/s) R Vel (m/s) L-R Vel (m/s)  Median Motor (Abd Poll Brev)  30.4C  Wrist 3.8 *4.3 0.5 11.0 8.2 25.5 Elbow Wrist 56 53 3  Elbow 7.2 7.9 0.7 10.8 7.9 26.9       Ulnar Motor (Abd Dig Min)  30.6C  Wrist 2.7 2.4 0.3 7.0 10.2 *31.4 B Elbow Wrist 57 56 1  B Elbow 5.6 5.5 0.1 6.8 10.3 34.0 A Elbow B Elbow 77 100 *23  A Elbow 6.9 6.5 0.4 6.7 10.4 35.6          Waveforms:                     Clinical History: No specialty comments available.     Objective:  VS:  HT:    WT:   BMI:     BP:   HR: bpm  TEMP: ( )  RESP:  Physical Exam Musculoskeletal:        General: Tenderness present. No swelling or deformity.     Comments: Inspection reveals no atrophy of the bilateral APB or FDI or hand intrinsics. There is no swelling, color changes, allodynia or dystrophic changes. There is 5 out of 5 strength in the bilateral wrist extension, finger abduction and long finger flexion. There is intact sensation to light touch in all dermatomal and peripheral nerve distributions. There is a negative Tinel's test at the bilateral wrist and elbow. There is a equivocally positive Phalen's test bilaterally. There is a negative Hoffmann's test bilaterally.  Skin:    General: Skin is warm and dry.     Findings: No erythema or rash.  Neurological:      General: No focal deficit present.     Mental Status: She is alert and oriented to person, place, and time.     Cranial Nerves: No cranial nerve deficit.     Sensory: No sensory deficit.     Motor: No weakness or abnormal muscle tone.     Coordination: Coordination normal.     Gait: Gait normal.  Psychiatric:        Mood and Affect: Mood normal.        Behavior: Behavior normal.      Imaging: No results found.
